# Patient Record
Sex: Female | Born: 1995
Health system: Southern US, Community
[De-identification: ages and names within clinical notes are randomized; demographics above are authoritative.]

## PROBLEM LIST (undated history)

## (undated) DIAGNOSIS — K219 Gastro-esophageal reflux disease without esophagitis: Secondary | ICD-10-CM

## (undated) DIAGNOSIS — K831 Obstruction of bile duct: Secondary | ICD-10-CM

## (undated) DIAGNOSIS — J45909 Unspecified asthma, uncomplicated: Secondary | ICD-10-CM

## (undated) DIAGNOSIS — K900 Celiac disease: Secondary | ICD-10-CM

## (undated) DIAGNOSIS — U071 COVID-19: Secondary | ICD-10-CM

## (undated) DIAGNOSIS — Z87820 Personal history of traumatic brain injury: Secondary | ICD-10-CM

## (undated) HISTORY — DX: Celiac disease: K90.0

## (undated) HISTORY — PX: OTHER SURGICAL HISTORY: SHX169

## (undated) HISTORY — DX: COVID-19: U07.1

## (undated) HISTORY — DX: Personal history of traumatic brain injury: Z87.820

---
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
## 2014-01-26 ENCOUNTER — Other Ambulatory Visit (HOSPITAL_COMMUNITY): Payer: Self-pay | Admitting: Physician Assistant

## 2014-01-26 DIAGNOSIS — R1030 Lower abdominal pain, unspecified: Secondary | ICD-10-CM

## 2014-01-27 ENCOUNTER — Encounter: Payer: Self-pay | Admitting: Internal Medicine

## 2014-01-28 ENCOUNTER — Ambulatory Visit (HOSPITAL_COMMUNITY)
Admission: RE | Admit: 2014-01-28 | Discharge: 2014-01-28 | Disposition: A | Discharge status: Home / Self Care | Payer: BC Managed Care – PPO | Source: Ambulatory Visit | Attending: Physician Assistant | Admitting: Physician Assistant

## 2014-01-28 ENCOUNTER — Encounter: Payer: Self-pay | Admitting: Internal Medicine

## 2014-01-28 DIAGNOSIS — R1030 Lower abdominal pain, unspecified: Secondary | ICD-10-CM | POA: Diagnosis present

## 2014-03-11 ENCOUNTER — Ambulatory Visit: Payer: BC Managed Care – PPO | Admitting: Gastroenterology

## 2014-03-18 ENCOUNTER — Ambulatory Visit: Payer: Self-pay | Admitting: Nurse Practitioner

## 2014-04-07 ENCOUNTER — Ambulatory Visit: Payer: Self-pay | Admitting: Nurse Practitioner

## 2014-04-27 ENCOUNTER — Ambulatory Visit: Payer: Self-pay | Admitting: Nurse Practitioner

## 2014-04-28 ENCOUNTER — Encounter: Payer: Self-pay | Admitting: Nurse Practitioner

## 2014-05-07 ENCOUNTER — Ambulatory Visit: Payer: Medicaid Other | Admitting: Nurse Practitioner

## 2014-05-14 ENCOUNTER — Ambulatory Visit (INDEPENDENT_AMBULATORY_CARE_PROVIDER_SITE_OTHER): Payer: BLUE CROSS/BLUE SHIELD | Admitting: Nurse Practitioner

## 2014-05-14 ENCOUNTER — Other Ambulatory Visit: Payer: Self-pay

## 2014-05-14 ENCOUNTER — Encounter: Payer: Self-pay | Admitting: Nurse Practitioner

## 2014-05-14 VITALS — BP 132/80 | HR 118 | Temp 97.8°F | Ht 66.0 in | Wt 139.0 lb

## 2014-05-14 DIAGNOSIS — R109 Unspecified abdominal pain: Secondary | ICD-10-CM | POA: Insufficient documentation

## 2014-05-14 DIAGNOSIS — K219 Gastro-esophageal reflux disease without esophagitis: Secondary | ICD-10-CM

## 2014-05-14 DIAGNOSIS — R1010 Upper abdominal pain, unspecified: Secondary | ICD-10-CM

## 2014-05-14 DIAGNOSIS — R1013 Epigastric pain: Secondary | ICD-10-CM | POA: Insufficient documentation

## 2014-05-14 MED ORDER — SUCRALFATE 1 G PO TABS
1.0000 g | ORAL_TABLET | Freq: Three times a day (TID) | ORAL | Status: DC | PRN
Start: 2014-05-14 — End: 2017-03-30

## 2014-05-14 NOTE — Progress Notes (Signed)
cc'ed to pcp °
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
## 2014-05-14 NOTE — Patient Instructions (Signed)
1. We will schedule you for your procedure (endoscopy) 2. Im sending in a prescription for Carafate to take up to three times a day as needed for worsening symptoms in the meantime. 3. We will check some labs as well. 4. Follow-up in 6-8 weeks, after your procedure. 5. Further recommendations to be based on the results of your procedure.

## 2014-05-14 NOTE — Assessment & Plan Note (Signed)
Abdominal pain for the past 2 years primarily epigastric with dyspepsia symptoms including nightly vomiting, nausea, excessive belching. Symptoms improved with addition of PPI Priloseec 20 mg daily, attempted change to Nexium by PCP with no effect, changed back to Prilosec 40 mg daily. Symptoms are persistent, although improved. Family history of ulcers and gastritis. PCP questioned possible gluten intolerance with TTG negative but noted IgA  Insufficiency. Will check IgG and TTG IgG, CBC. Likely GERD with possible gastritis/esophagitis. Will add Carafate 1 g up to tid prn for breakthrough symptoms. Return for re-evaluation in 6-8 weeks.  Proceed with EGD with Dr. Gala Romney in near future: the risks, benefits, and alternatives have been discussed with the patient in detail. The patient states understanding and desires to proceed.

## 2014-05-20 ENCOUNTER — Encounter (HOSPITAL_COMMUNITY): Payer: Self-pay | Admitting: *Deleted

## 2014-05-20 ENCOUNTER — Encounter (HOSPITAL_COMMUNITY)
Admission: RE | Disposition: A | Discharge status: Home / Self Care | Payer: Self-pay | Source: Ambulatory Visit | Attending: Internal Medicine

## 2014-05-20 ENCOUNTER — Ambulatory Visit (HOSPITAL_COMMUNITY)
Admission: RE | Admit: 2014-05-20 | Discharge: 2014-05-20 | Disposition: A | Discharge status: Home / Self Care | Payer: BLUE CROSS/BLUE SHIELD | Source: Ambulatory Visit | Attending: Internal Medicine | Admitting: Internal Medicine

## 2014-05-20 DIAGNOSIS — R140 Abdominal distension (gaseous): Secondary | ICD-10-CM | POA: Insufficient documentation

## 2014-05-20 DIAGNOSIS — R110 Nausea: Secondary | ICD-10-CM | POA: Insufficient documentation

## 2014-05-20 DIAGNOSIS — D802 Selective deficiency of immunoglobulin A [IgA]: Secondary | ICD-10-CM | POA: Diagnosis not present

## 2014-05-20 DIAGNOSIS — Z800 Family history of malignant neoplasm of digestive organs: Secondary | ICD-10-CM | POA: Diagnosis not present

## 2014-05-20 DIAGNOSIS — Z91018 Allergy to other foods: Secondary | ICD-10-CM | POA: Insufficient documentation

## 2014-05-20 DIAGNOSIS — K449 Diaphragmatic hernia without obstruction or gangrene: Secondary | ICD-10-CM | POA: Diagnosis not present

## 2014-05-20 DIAGNOSIS — Z888 Allergy status to other drugs, medicaments and biological substances status: Secondary | ICD-10-CM | POA: Insufficient documentation

## 2014-05-20 DIAGNOSIS — R109 Unspecified abdominal pain: Secondary | ICD-10-CM | POA: Diagnosis not present

## 2014-05-20 DIAGNOSIS — K219 Gastro-esophageal reflux disease without esophagitis: Secondary | ICD-10-CM

## 2014-05-20 DIAGNOSIS — Z79899 Other long term (current) drug therapy: Secondary | ICD-10-CM | POA: Diagnosis not present

## 2014-05-20 HISTORY — DX: Gastro-esophageal reflux disease without esophagitis: K21.9

## 2014-05-20 HISTORY — PX: ESOPHAGOGASTRODUODENOSCOPY: SHX5428

## 2014-05-20 SURGERY — EGD (ESOPHAGOGASTRODUODENOSCOPY)
Anesthesia: Moderate Sedation

## 2014-05-20 MED ORDER — MEPERIDINE HCL 100 MG/ML IJ SOLN
INTRAMUSCULAR | Status: DC | PRN
  Administered 2014-05-20: 50 mg via INTRAVENOUS
  Administered 2014-05-20 (×2): 25 mg via INTRAVENOUS

## 2014-05-20 MED ORDER — ONDANSETRON HCL 4 MG/2ML IJ SOLN
INTRAMUSCULAR | Status: DC | PRN
  Administered 2014-05-20: 4 mg via INTRAVENOUS

## 2014-05-20 MED ORDER — SIMETHICONE 40 MG/0.6ML PO SUSP
ORAL | Status: DC | PRN
  Administered 2014-05-20: 10:00:00

## 2014-05-20 MED ORDER — SODIUM CHLORIDE 0.9 % IV SOLN
INTRAVENOUS | Status: DC
Start: 2014-05-20 — End: 2014-05-20
  Administered 2014-05-20: 09:00:00 via INTRAVENOUS

## 2014-05-20 MED ORDER — LIDOCAINE VISCOUS 2 % MT SOLN
OROMUCOSAL | Status: DC | PRN
  Administered 2014-05-20: 3 mL via OROMUCOSAL

## 2014-05-20 MED ORDER — ONDANSETRON HCL 4 MG/2ML IJ SOLN
INTRAMUSCULAR | Status: AC
  Filled 2014-05-20: qty 2

## 2014-05-20 MED ORDER — LIDOCAINE VISCOUS 2 % MT SOLN
OROMUCOSAL | Status: AC
  Filled 2014-05-20: qty 15

## 2014-05-20 MED ORDER — MEPERIDINE HCL 100 MG/ML IJ SOLN
INTRAMUSCULAR | Status: AC
  Filled 2014-05-20: qty 2

## 2014-05-20 MED ORDER — MIDAZOLAM HCL 5 MG/5ML IJ SOLN
INTRAMUSCULAR | Status: DC | PRN
  Administered 2014-05-20: 1 mg via INTRAVENOUS
  Administered 2014-05-20 (×2): 2 mg via INTRAVENOUS

## 2014-05-20 MED ORDER — MIDAZOLAM HCL 5 MG/5ML IJ SOLN
INTRAMUSCULAR | Status: AC
  Filled 2014-05-20: qty 10

## 2014-05-20 NOTE — Discharge Instructions (Addendum)
EGD Discharge instructions Please read the instructions outlined below and refer to this sheet in the next few weeks. These discharge instructions provide you with general information on caring for yourself after you leave the hospital. Your doctor may also give you specific instructions. While your treatment has been planned according to the most current medical practices available, unavoidable complications occasionally occur. If you have any problems or questions after discharge, please call your doctor. ACTIVITY  You may resume your regular activity but move at a slower pace for the next 24 hours.   Take frequent rest periods for the next 24 hours.   Walking will help expel (get rid of) the air and reduce the bloated feeling in your abdomen.   No driving for 24 hours (because of the anesthesia (medicine) used during the test).   You may shower.   Do not sign any important legal documents or operate any machinery for 24 hours (because of the anesthesia used during the test).  NUTRITION  Drink plenty of fluids.   You may resume your normal diet.   Begin with a light meal and progress to your normal diet.   Avoid alcoholic beverages for 24 hours or as instructed by your caregiver.  MEDICATIONS  You may resume your normal medications unless your caregiver tells you otherwise.  WHAT YOU CAN EXPECT TODAY  You may experience abdominal discomfort such as a feeling of fullness or gas pains.  FOLLOW-UP  Your doctor will discuss the results of your test with you.  SEEK IMMEDIATE MEDICAL ATTENTION IF ANY OF THE FOLLOWING OCCUR:  Excessive nausea (feeling sick to your stomach) and/or vomiting.   Severe abdominal pain and distention (swelling).   Trouble swallowing.   Temperature over 101 F (37.8 C).  Rectal bleeding or vomiting of blood.    GERD information provided  Continue Prilosec 40 mg daily  Begin Carafate as previously recommended  Further recommendations to  follow pending review of pathology report  Office visit with [place] in about 6-8 weeks     Gastroesophageal Reflux Disease, Adult Gastroesophageal reflux disease (GERD) happens when acid from your stomach flows up into the esophagus. When acid comes in contact with the esophagus, the acid causes soreness (inflammation) in the esophagus. Over time, GERD may create small holes (ulcers) in the lining of the esophagus. CAUSES   Increased body weight. This puts pressure on the stomach, making acid rise from the stomach into the esophagus.  Smoking. This increases acid production in the stomach.  Drinking alcohol. This causes decreased pressure in the lower esophageal sphincter (valve or ring of muscle between the esophagus and stomach), allowing acid from the stomach into the esophagus.  Late evening meals and a full stomach. This increases pressure and acid production in the stomach.  A malformed lower esophageal sphincter. Sometimes, no cause is found. SYMPTOMS   Burning pain in the lower part of the mid-chest behind the breastbone and in the mid-stomach area. This may occur twice a week or more often.  Trouble swallowing.  Sore throat.  Dry cough.  Asthma-like symptoms including chest tightness, shortness of breath, or wheezing. DIAGNOSIS  Your caregiver may be able to diagnose GERD based on your symptoms. In some cases, X-rays and other tests may be done to check for complications or to check the condition of your stomach and esophagus. TREATMENT  Your caregiver may recommend over-the-counter or prescription medicines to help decrease acid production. Ask your caregiver before starting or adding any new medicines.  HOME CARE INSTRUCTIONS   Change the factors that you can control. Ask your caregiver for guidance concerning weight loss, quitting smoking, and alcohol consumption.  Avoid foods and drinks that make your symptoms worse, such as:  Caffeine or alcoholic  drinks.  Chocolate.  Peppermint or mint flavorings.  Garlic and onions.  Spicy foods.  Citrus fruits, such as oranges, lemons, or limes.  Tomato-based foods such as sauce, chili, salsa, and pizza.  Fried and fatty foods.  Avoid lying down for the 3 hours prior to your bedtime or prior to taking a nap.  Eat small, frequent meals instead of large meals.  Wear loose-fitting clothing. Do not wear anything tight around your waist that causes pressure on your stomach.  Raise the head of your bed 6 to 8 inches with wood blocks to help you sleep. Extra pillows will not help.  Only take over-the-counter or prescription medicines for pain, discomfort, or fever as directed by your caregiver.  Do not take aspirin, ibuprofen, or other nonsteroidal anti-inflammatory drugs (NSAIDs). SEEK IMMEDIATE MEDICAL CARE IF:   You have pain in your arms, neck, jaw, teeth, or back.  Your pain increases or changes in intensity or duration.  You develop nausea, vomiting, or sweating (diaphoresis).  You develop shortness of breath, or you faint.  Your vomit is green, yellow, black, or looks like coffee grounds or blood.  Your stool is red, bloody, or black. These symptoms could be signs of other problems, such as heart disease, gastric bleeding, or esophageal bleeding. MAKE SURE YOU:   Understand these instructions.  Will watch your condition.  Will get help right away if you are not doing well or get worse. Document Released: 12/07/2004 Document Revised: 05/22/2011 Document Reviewed: 09/16/2010 [facility] Patient Information 2015 [place], [place]. This information is not intended to replace advice given to you by your health care provider. Make sure you discuss any questions you have with your health care provider.
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
## 2014-05-20 NOTE — Interval H&P Note (Signed)
History and Physical Interval Note:  05/20/2014 9:51 AM  Maria Cooper  has presented today for surgery, with the diagnosis of GERD  The various methods of treatment have been discussed with the patient and family. After consideration of risks, benefits and other options for treatment, the patient has consented to  Procedure(s) with comments: ESOPHAGOGASTRODUODENOSCOPY (EGD) (N/A) - 1015 - Candy to notify pt  as a surgical intervention .  The patient's history has been reviewed, patient examined, no change in status, stable for surgery.  I have reviewed the patient's chart and labs.  Questions were answered to the patient's satisfaction.     Manus Rudd  Notable improvement, but not complete resolution of symptoms, will taking Prilosec 40 mg daily. Just got Carafate prescription filled yesterday but has not yet started taking. The no change otherwise.  The risks, benefits, limitations, alternatives and imponderables have been reviewed with the patient. Potential for esophageal dilation, biopsy, etc. have also been reviewed.  Questions have been answered. All parties agreeable.

## 2014-05-20 NOTE — Op Note (Signed)
[facility] [address] [place], [address]   ENDOSCOPY PROCEDURE REPORT  PATIENT: Maria, Cooper  MR#: [id number] BIRTHDATE: [birth year] , 19  yrs. old GENDER: female ENDOSCOPIST: R.  Garfield Cornea, MD Quentin Ore REFERRED BY:     [facility] PROCEDURE DATE:  2014/06/12 PROCEDURE:  EGD w/ biopsy INDICATIONS:  Refractory GERD; reported normal TTG antibody with IgA deficiency. MEDICATIONS: Versed 5 mg IV and Demerol 100 mg IV in divided doses. Zofran 4 mg IV. Xylocaine gel orally.. ASA CLASS:      Class II  CONSENT: The risks, benefits, limitations, alternatives and imponderables have been discussed.  The potential for biopsy, esophogeal dilation, etc. have also been reviewed.  Questions have been answered.  All parties agreeable.  Please see the history and physical in the medical record for more information.  DESCRIPTION OF PROCEDURE: After the risks benefits and alternatives of the procedure were thoroughly explained, informed consent was obtained.  The EG-[id number] ([id number]) endoscope was introduced through the mouth and advanced to the second portion of the duodenum , limited by Without limitations. The instrument was slowly withdrawn as the mucosa was fully examined. Normal esophagus. 1 cm hiatal hernia. Normal-appearing gastric mucosa. Normal-appearing first, second and third portion of the duodenum.  The duodenum was biopsied    The scope was then withdrawn from the patient and the procedure completed.  COMPLICATIONS: There were no immediate complications.  ENDOSCOPIC IMPRESSION: Normal esophagus. Tiny hiatal hernia; otherwise normal-appearing stomach and duodenum?"status post duodenal biopsy  RECOMMENDATIONS: Continue Prilosec 40 mg daily. Add Carafate suspension to regimen as previously recommended. Follow-up on pathology. Office visit with [place] in 6 weeks.  REPEAT EXAM:  eSigned:  R. Garfield Cornea, MD Rosalita Chessman [facility] June 12, 2014  10:29 AM    CC:  CPT CODES: ICD CODES:  The ICD and CPT codes recommended by this software are interpretations from the data that the clinical staff has captured with the software.  The verification of the translation of this report to the ICD and CPT codes and modifiers is the sole responsibility of the health care institution and practicing physician where this report was generated.  [employer]. will not be held responsible for the validity of the ICD and CPT codes included on this report.  AMA assumes no liability for data contained or not contained herein. CPT is a Designer, television/film set of the [employer].

## 2014-05-21 ENCOUNTER — Encounter (HOSPITAL_COMMUNITY): Payer: Self-pay | Admitting: Internal Medicine

## 2014-05-21 ENCOUNTER — Other Ambulatory Visit (HOSPITAL_COMMUNITY)
Admission: RE | Admit: 2014-05-21 | Discharge: 2014-05-21 | Disposition: A | Discharge status: Home / Self Care | Payer: BLUE CROSS/BLUE SHIELD | Source: Ambulatory Visit | Attending: Nurse Practitioner | Admitting: Nurse Practitioner

## 2014-05-21 ENCOUNTER — Encounter: Payer: Self-pay | Admitting: Internal Medicine

## 2014-06-02 ENCOUNTER — Telehealth: Payer: Self-pay | Admitting: Internal Medicine

## 2014-06-02 NOTE — Telephone Encounter (Signed)
I called the patient to confirm which OV she wanted since she had been scheduled twice. She is aware of OV for 4/27 and then asked if she could have her labs done at [facility]?  She is going either tomorrow or the next day.Please [phone]

## 2014-06-03 ENCOUNTER — Other Ambulatory Visit: Payer: Self-pay

## 2014-06-03 DIAGNOSIS — K900 Celiac disease: Secondary | ICD-10-CM

## 2014-06-03 NOTE — Telephone Encounter (Signed)
I have talked with the pt. I informed her that she can have the blood work done at her pcp and asked her to make sure to tell them to fax it to [place]. I also went over her bx letter with her and informed her that she had celiac disease. I am mailing the letter and some celiac information to her. Advised her that it was very important for her to have the blood work done. She stated she understood and didn't have any questions at this time.  Per RMR letter- pt needs referral to the dietician at [facility]. She is aware of this. Please refer pt.   Manuela Schwartz, please fax procedure note and bx and RMR letter to pcp.

## 2014-06-03 NOTE — Telephone Encounter (Signed)
Faxed procedure, path and letter to [facility]

## 2014-06-04 NOTE — Telephone Encounter (Signed)
Referral has been made.

## 2014-07-08 ENCOUNTER — Encounter: Payer: Self-pay | Admitting: Nurse Practitioner

## 2014-07-08 ENCOUNTER — Telehealth: Payer: Self-pay | Admitting: Internal Medicine

## 2014-07-08 ENCOUNTER — Ambulatory Visit: Payer: BLUE CROSS/BLUE SHIELD | Admitting: Nurse Practitioner

## 2014-07-08 NOTE — Telephone Encounter (Signed)
PATIENT WAS A NO SHOW 07/08/14 AND LETTER WAS SENT

## 2014-07-09 ENCOUNTER — Encounter: Payer: Self-pay | Admitting: Nurse Practitioner

## 2014-07-09 ENCOUNTER — Ambulatory Visit (INDEPENDENT_AMBULATORY_CARE_PROVIDER_SITE_OTHER): Payer: BLUE CROSS/BLUE SHIELD | Admitting: Nurse Practitioner

## 2014-07-09 VITALS — BP 126/73 | HR 93 | Temp 97.9°F | Ht 66.0 in | Wt 139.4 lb

## 2014-07-09 DIAGNOSIS — K900 Celiac disease: Secondary | ICD-10-CM | POA: Diagnosis not present

## 2014-07-09 HISTORY — DX: Celiac disease: K90.0
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
## 2014-07-09 NOTE — Progress Notes (Signed)
cc'ed to pcp °

## 2014-07-09 NOTE — Assessment & Plan Note (Signed)
Patient with recurrent celiac sprue on duodenal biopsy during endoscopy. Patient has been sent information about celiac disease and appropriate dietary restrictions. One was made with nutritionist Forestine Na, however she received a note from her insurance company that it would not be covered and it seems like it's in network issue. Have requested office staff to contact insurance company and see if we can find a nutritionist that is in network with her she can benefit from this excellent counseling. She has been trying to avoid gluten and has begun to notice the symptomatically changes when she had inadvertently consumed gluten. Overall she is doing quite well. We'll have her return for follow-up in 6 months to check on her progress and make any changes as necessary.

## 2014-07-09 NOTE — Patient Instructions (Addendum)
1. We will try to contact her insurance company and find a nutritionist that is in network would be covered. 2. We will call you with the results of this. 3. Return for follow-up in 6 months and we can check her progress and see her doing     Celiac Disease Celiac disease is a digestive disease that causes your body's natural defense system (immune system) to react against its own cells. It interferes with taking in (absorbing) nutrients from food. Celiac disease is also known as celiac sprue, nontropical sprue, and gluten-sensitive enteropathy. People who have celiac disease cannot tolerate gluten. Gluten is a protein found in wheat, rye, and barley. With time, celiac disease will damage the cells lining the small intestine. This leads to being unable to absorb nutrients from food (malabsorption), diarrhea, and nutritional problems. CAUSES  Celiac disease is genetic. This means you have a higher likelihood of getting the disease if someone in your family has or has had it. Up to 10% of your close relatives (parent, sibling, child) may also have the disease.  People with celiac disease tend to have other autoimmune diseases. The link may be genetic. These diseases include:  Dermatitis herpetiformis.  Thyroid disease.  Systemic lupus erythematosus.  Type 1 diabetes.  Liver disease.  Collagen vascular disease.  Rheumatoid arthritis.  Sjogren syndrome. SYMPTOMS  The symptoms of celiac disease vary from person to person. The symptoms are generally digestive or nutritional. Digestive symptoms include:  Recurring belly (abdominal) bloating and pain.  Gas.  Long-term (chronic) diarrhea.  Pale, bad-smelling, greasy, or oily stool. Nutritional symptoms include:  Failure to thrive in infants.  Delayed growth in children.  Weight loss in children and adults.  Missed menstrual periods (often due to extreme weight loss).  Anemia.  Weakening bones (osteoporosis).  Fatigue and  weakness.  Tingling or other signs of nerve damage (peripheral neuropathy).  Depression. DIAGNOSIS  If your symptoms and physical exam suggest that a digestive disorder or malnutrition is present, your caregiver may suspect celiac disease. You may have already begun a gluten-free diet. If symptoms persist, testing may be needed to confirm the diagnosis. Some tests are best done while you are on a normal, unrestricted diet. Tests may include:  Blood tests to check for nutritional deficiencies.  Blood tests to look for evidence that the body is producing antibodies against its own small intestine cells.  Taking a tissue sample (biopsy) from the small bowel for evaluation.  X-rays of the small bowel.  Evaluating the stool for fat.  Tests to check for nutrient absorption from the intestine. TREATMENT  It is important to seek treatment. Untreated celiac disease can cause growth problems (in children), anemia, osteoporosis, and possible nerve problems. A pregnant patient with untreated celiac disease has a higher risk of miscarriage, and the fetus has an increased risk of low birth weight and other growth problems. If celiac disease is diagnosed in the early stages, treatment can allow you to live a long, nearly symptom-free life. Treatment includes following a gluten-free diet. This means avoiding all foods that contain gluten. Eating even a small amount of gluten can damage your intestine. For most people, following this diet will stop symptoms. It will heal existing intestinal damage and prevent further damage. Improvements begin within days of starting the diet. The small intestine is usually completely healed within 3 to 6 months, or it may take up to 2 years for older adults. A small percentage of people do not improve on the  gluten-free diet. Depending on your age and the stage at which you were diagnosed, some problems such as delayed growth and discolored teeth may not improve. Sometimes,  damaged intestines cannot heal. If your intestines are not absorbing enough nutrients, you may need to receive nutrition supplements through an intravenous (IV) tube. Drug treatments are being tested for unresponsive celiac disease. In this case, you may need to be evaluated for complications of the disease. Your caregiver may also recommend:  A pneumonia vaccination.  Nutrients and other treatments for any nutritional deficiencies. Your caregiver can provide you with more information on a gluten-free diet. Discussion with a dietitian skilled in this illness will be valuable. Support groups may also be helpful. HOME CARE INSTRUCTIONS   Focus on a gluten-free diet. This diet must become a way of life.  Monitor your response to the gluten-free diet and treat any nutritional deficiencies.  Prepare ahead of time if you decide to eat outside the home.  Make and keep your regular follow-up visits with your caregiver.  Suggest to family members that they get screened for early signs of the disease. SEEK MEDICAL CARE IF:   You continue to have digestive symptoms (gas, cramping, diarrhea) despite a proper diet.  You have trouble sticking to the gluten-free diet.  You develop an itchy rash with groups of tiny blisters.  You develop severe weakness, balance problems, menstrual problems, or depression. Document Released: 02/27/2005 Document Revised: 07/14/2013 Document Reviewed: 06/16/2009 [facility] Patient Information 2015 [place], [place]. This information is not intended to replace advice given to you by your health care provider. Make sure you discuss any questions you have with your health care provider.    Gluten-Free Diet for Celiac Disease Gluten is a protein found in wheat, rye, barley, and triticale (a cross between wheat and rye) grains. People with celiac disease need to have a gluten-free diet. With celiac disease, gluten interferes with the absorption of food and may also cause  intestinal injury.  Strict compliance is important even during symptom-free periods. This means eliminating all foods with gluten from your diet permanently. This requires some significant changes but is very manageable. WHAT DO I NEED TO KNOW ABOUT A GLUTEN-FREE DIET?  Look for items labeled with "GF." Looking for GF will make it easier to identify products that are safe to eat.  Read all labels. Gluten may have been added as a minor ingredient where least expected, such as in shredded cheeses or ice creams. Always check food labels and investigate questionable ingredients. Talk to your dietitian or health care provider if you have questions about certain foods or need help finding GF foods.  Check when in doubt. If you are not sure whether an ingredient contains gluten, check with the manufacturer. Note that some manufacturers may change ingredients without notice. Always read labels.   Know how food is prepared. Since flour and cereal products are often used in the preparation of foods, it is important to be aware of the methods of preparation used, as well as the ingredients in the foods themselves. This is especially true when you are dining out. Ask restaurants if they have a gluten-free menu.  Watch for cross-contamination. Cross-contamination occurs when gluten-free foods come into contact with foods that contain gluten. It often happens during the manufacturing process. Always check the ingredient list and for warnings on packages, such as "may contain gluten."  Eat a balanced diet. It is important to still get enough fiber, iron, and B vitamins in your  diet. Look for enriched whole grain gluten-free products and continue to eat a well-balanced diet of the important non-grain items, such as vegetables, fruit, lean proteins, legumes, and dairy.  Consider taking a gluten-free multivitamin and mineral supplement. Discuss this with your health care provider. WHAT KEY WORDS HELP IDENTIFY  GLUTEN? Know key words to help identify gluten. A dietitian can help you identify possible harmful ingredients in the foods you normally eat. Words to check for on food labels include:   Flour, enriched flour, bromated flour, white flour, durum flour, graham flour, phosphated flour, self-rising flour, semolina, or farina.  Starch, dextrin, modified food starch, or cereal.  Thickening, fillers, or emulsifiers.  Any kind of malt flavoring, extract, or syrup (malt is made from barley and includes malt vinegar, malted milk, and malted beverages).  Hydrolyzed vegetable protein. WHAT FOODS CAN I EAT? Below is a list of common foods that are allowed with a gluten-free diet.  Grains Products made from the following flours or grains:amaranth,bean flours, 100% buckwheat flour, corn, millet, nut flours or meals, GF oats, quinoa, rice, sorghum, teff, any all-purpose 100% GF flour mix, rice wafers, pure cornmeal tortillas, popcorn, some crackers, some chips, and hot cereals made from cornmeal. Ask your dietitian which specific hot and cold cereals are allowed. Hominy, rice or wild rice, and special GF pasta. Some Asian rice noodles or bean noodles. Arrowroot starch, corn bran, corn flour, corn germ, cornmeal, corn starch, potato flour, potato starch flour, and rice bran. Rice flours: plain, brown, and sweet. Rice polish, soy flour, tapioca starch. Vegetables All plain, fresh, frozen, or canned vegetables.  Fruits All fresh, frozen, canned, dried fruits, and fruit juices.  Meats and Other Protein Foods Meat, fish, poultry, or eggs prepared without added wheat, rye, barley, or triticale. Some luncheon meat and some frankfurters. Pure meat. All aged cheese, most processed cheese products, some cottage cheese, and some cream cheese. Dried beans, dried peas, and lentils.  Dairy Milk and yogurt made with allowed ingredients.  Beverages Coffee (regular or decaffeinated), tea, herbal tea (read label to  be sure that no wheat flour has been added). Carbonated beverages and some root beers. Wine, sake, and distilled spirits, such as gin, vodka, and whiskey. GF beers and GF ciders.  Sweetsand Desserts Sugar, honey, some syrups, molasses, jelly, jam, plain hard candy, marshmallows, gumdrops, homemade candies free of wheat, rye, barley, or triticale. Coconut. Custard, some pudding mixes, and homemade puddings from cornstarch, rice, and tapioca. Gelatin desserts, sorbets, frozen ice pops, and sherbet. Cake, cookies, and other desserts prepared with allowed flours. Some commercial ice creams. Ask your dietitian about specific brands of dessert that are allowed.  Fats and Oils Butter, margarine, vegetable oil, sour cream not containing modified food starch, whipping cream, shortening, lard, cream, and some mayonnaise. Some commercial salad dressings. Peanut butter.  Other Homemade broth and soups made with allowed ingredients; some canned or frozen soups. Any other combination or prepared foods that do not contain gluten. Monosodium glutamate (MSG). Cider, rice, and wine vinegar. Baking soda and baking powder. Certain soy sauces (Tamari). Ask your dietitian about specific brands that are allowed. Nuts, coconut, chocolate, and pure cocoa powder. Salt, pepper, herbs, spices, extracts, and food colorings. The items listed above may not be a complete list of allowed foods or beverages. Contact your dietitian for more options.  WHAT FOODS CAN I NOT EAT? Below is a list of common foods that are not allowed with a gluten-free diet.  Grains Barley, bran, bulgur, cracked  wheat, graham, malt, matzo, wheat germ, and all wheat and rye cereals including spelt and kamut. Avoid cereals containing malt as a flavoring, such as rice cereal. Also avoid regular noodles, spaghetti, macaroni, and most packaged rice mixes, and all others containing wheat, rye, barley, or triticale.  Vegetables Most creamed vegetables, most  vegetables canned in sauces, and any vegetables prepared with wheat, rye, barley, or triticale.  Fruits Thickened or prepared fruits and some pie fillings.  Meats and Other Protein Sources Any meat or meat alternative containing wheat, rye, barley, or gluten stabilizers (such as some hot dogs, salami, cold cuts, or sausage). Bread-containing products, such as Swiss steak, croquettes, and meatloaf. Most tuna canned in vegetable broth, [place] with hydrolyzed vegetable protein (HVP) injected as part of the basting, and any cheese product containing oat gum as an ingredient. Seitan. Imitation fish. Dairy Commercial chocolate milk, which may have cereal added, and malted milk. Beverages Certain cereal beverages. Beer and ciders (unless GF), ale, malted milk, and some root beers. Sweetsand Desserts Commercial candies containing wheat, rye, barley, or triticale. Certain toffees are dusted with wheat flour. Chocolate-coated nuts, which are often rolled in flour. Cakes, cookies, doughnuts, and pastries that are prepared with wheat, barley, rye, or triticale flour. Some commercial ice creams, ice cream flavors which contain cookies, crumbs, or cheesecake. Ice cream cones. Commercially prepared mixes for cakes, cookies, and other desserts unless marked GF. Bread pudding and other puddings thickened with flour. Fats and Oils Some commercial salad dressings and sour cream containing modified food starch.  Condiments Some curry powder, some dry seasoning mixes, some gravy extracts, some meat sauces, some ketchup, some prepared mustard, horseradish. Other All soups containing wheat, rye, barley, or triticale flour. Bouillon and bouillon cubes that contain HVP. Combination or prepared foods that contain gluten. Some soy sauce, some chip dips, and some chewing gum. Yeast extract (contains barley). Caramel color (may contain malt). The items listed above may not be a complete list of foods and beverages to  avoid. Contact your dietitian for more information. Document Released: 02/27/2005 Document Revised: 07/14/2013 Document Reviewed: 01/01/2013 [facility] Patient Information 2015 [place], [place]. This information is not intended to replace advice given to you by your health care provider. Make sure you discuss any questions you have with your health care provider.

## 2014-07-14 ENCOUNTER — Ambulatory Visit: Payer: BLUE CROSS/BLUE SHIELD | Admitting: Nurse Practitioner

## 2014-07-24 NOTE — Telephone Encounter (Signed)
Noted  

## 2014-08-05 ENCOUNTER — Ambulatory Visit: Payer: BLUE CROSS/BLUE SHIELD | Admitting: Nutrition

## 2014-09-16 ENCOUNTER — Telehealth: Payer: Self-pay | Admitting: Nutrition

## 2014-09-16 NOTE — Telephone Encounter (Signed)
Left VM to call to reschedule missed/canceled appointment. PC

## 2015-01-07 ENCOUNTER — Ambulatory Visit: Payer: BLUE CROSS/BLUE SHIELD | Admitting: Nurse Practitioner

## 2015-12-13 ENCOUNTER — Emergency Department (HOSPITAL_COMMUNITY): Payer: BLUE CROSS/BLUE SHIELD

## 2015-12-13 ENCOUNTER — Encounter (HOSPITAL_COMMUNITY): Payer: Self-pay

## 2015-12-13 ENCOUNTER — Emergency Department (HOSPITAL_COMMUNITY)
Admission: EM | Admit: 2015-12-13 | Discharge: 2015-12-13 | Disposition: A | Discharge status: Home / Self Care | Payer: BLUE CROSS/BLUE SHIELD | Attending: Emergency Medicine | Admitting: Emergency Medicine

## 2015-12-13 DIAGNOSIS — R1031 Right lower quadrant pain: Secondary | ICD-10-CM | POA: Insufficient documentation

## 2015-12-13 DIAGNOSIS — R110 Nausea: Secondary | ICD-10-CM | POA: Diagnosis not present

## 2015-12-13 DIAGNOSIS — R102 Pelvic and perineal pain: Secondary | ICD-10-CM

## 2015-12-13 DIAGNOSIS — Z79899 Other long term (current) drug therapy: Secondary | ICD-10-CM | POA: Insufficient documentation

## 2015-12-13 LAB — COMPREHENSIVE METABOLIC PANEL
ALK PHOS: 55 U/L (ref 38–126)
ALT: 13 U/L — ABNORMAL LOW (ref 14–54)
AST: 17 U/L (ref 15–41)
Albumin: 4.3 g/dL (ref 3.5–5.0)
Anion gap: 7 (ref 5–15)
BUN: 11 mg/dL (ref 6–20)
CALCIUM: 9.1 mg/dL (ref 8.9–10.3)
CO2: 25 mmol/L (ref 22–32)
Chloride: 105 mmol/L (ref 101–111)
Creatinine, Ser: 0.67 mg/dL (ref 0.44–1.00)
Glucose, Bld: 114 mg/dL — ABNORMAL HIGH (ref 65–99)
Potassium: 3.4 mmol/L — ABNORMAL LOW (ref 3.5–5.1)
SODIUM: 137 mmol/L (ref 135–145)
Total Bilirubin: 0.7 mg/dL (ref 0.3–1.2)
Total Protein: 7.2 g/dL (ref 6.5–8.1)

## 2015-12-13 LAB — CBC WITH DIFFERENTIAL/PLATELET
BASOS ABS: 0 10*3/uL (ref 0.0–0.1)
Basophils Relative: 1 %
Eosinophils Absolute: 0 10*3/uL (ref 0.0–0.7)
Eosinophils Relative: 0 %
HCT: 37.9 % (ref 36.0–46.0)
HEMOGLOBIN: 13.2 g/dL (ref 12.0–15.0)
LYMPHS ABS: 1.8 10*3/uL (ref 0.7–4.0)
Lymphocytes Relative: 21 %
MCH: 30.2 pg (ref 26.0–34.0)
MCHC: 34.8 g/dL (ref 30.0–36.0)
MCV: 86.7 fL (ref 78.0–100.0)
Monocytes Absolute: 0.6 10*3/uL (ref 0.1–1.0)
Monocytes Relative: 7 %
NEUTROS PCT: 71 %
Neutro Abs: 6.2 10*3/uL (ref 1.7–7.7)
Platelets: 166 10*3/uL (ref 150–400)
RBC: 4.37 MIL/uL (ref 3.87–5.11)
RDW: 11.7 % (ref 11.5–15.5)
WBC: 8.7 10*3/uL (ref 4.0–10.5)

## 2015-12-13 LAB — URINE MICROSCOPIC-ADD ON: WBC, UA: NONE SEEN WBC/hpf (ref 0–5)

## 2015-12-13 LAB — POC URINE PREG, ED: Preg Test, Ur: NEGATIVE

## 2015-12-13 LAB — LIPASE, BLOOD: Lipase: 20 U/L (ref 11–51)

## 2015-12-13 LAB — URINALYSIS, ROUTINE W REFLEX MICROSCOPIC
Bilirubin Urine: NEGATIVE
Glucose, UA: NEGATIVE mg/dL
Ketones, ur: NEGATIVE mg/dL
LEUKOCYTES UA: NEGATIVE
Nitrite: NEGATIVE
Protein, ur: NEGATIVE mg/dL
SPECIFIC GRAVITY, URINE: 1.025 (ref 1.005–1.030)
pH: 5.5 (ref 5.0–8.0)

## 2015-12-13 MED ORDER — SODIUM CHLORIDE 0.9 % IV BOLUS (SEPSIS)
1000.0000 mL | Freq: Once | INTRAVENOUS | Status: AC
  Administered 2015-12-13: 1000 mL via INTRAVENOUS

## 2015-12-13 MED ORDER — FENTANYL CITRATE (PF) 100 MCG/2ML IJ SOLN
50.0000 ug | INTRAMUSCULAR | Status: DC | PRN
  Administered 2015-12-13 (×2): 50 ug via INTRAVENOUS
  Filled 2015-12-13 (×2): qty 2

## 2015-12-13 MED ORDER — IBUPROFEN 400 MG PO TABS
600.0000 mg | ORAL_TABLET | Freq: Once | ORAL | Status: AC
Start: 2015-12-13 — End: 2015-12-13
  Administered 2015-12-13: 600 mg via ORAL
  Filled 2015-12-13: qty 2

## 2015-12-13 MED ORDER — IOPAMIDOL (ISOVUE-300) INJECTION 61%
100.0000 mL | Freq: Once | INTRAVENOUS | Status: AC | PRN
  Administered 2015-12-13: 100 mL via INTRAVENOUS

## 2015-12-13 MED ORDER — ONDANSETRON HCL 4 MG/2ML IJ SOLN
4.0000 mg | Freq: Once | INTRAMUSCULAR | Status: AC
  Administered 2015-12-13: 4 mg via INTRAVENOUS
  Filled 2015-12-13: qty 2

## 2015-12-13 MED ORDER — IOPAMIDOL (ISOVUE-300) INJECTION 61%
INTRAVENOUS | Status: AC
  Filled 2015-12-13: qty 30

## 2015-12-13 NOTE — ED Notes (Signed)
Set up for pelvic exam

## 2015-12-13 NOTE — ED Notes (Signed)
Pt returned from ct. nad
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
## 2015-12-13 NOTE — ED Triage Notes (Signed)
Pt c/o pain in rlq and diarrhea that started Saturday.  Reports no diarrhea since Saturday but still having pain.  Denies any urinary symptoms, denies any vaginal bleeding or discharge.

## 2015-12-13 NOTE — ED Notes (Signed)
In with edp

## 2015-12-13 NOTE — ED Notes (Signed)
Pt taken to ct 

## 2015-12-13 NOTE — Discharge Instructions (Signed)
Take ibuprofen and tylenol for pain.  If you were given medicines take as directed.  If you are on coumadin or contraceptives realize their levels and effectiveness is altered by many different medicines.  If you have any reaction (rash, tongues swelling, other) to the medicines stop taking and see a physician.    If your blood pressure was elevated in the ER make sure you follow up for management with a primary doctor or return for chest pain, shortness of breath or stroke symptoms.  Please follow up as directed and return to the ER or see a physician for new or worsening symptoms.  Thank you. Vitals:   12/13/15 0830 12/13/15 0900 12/13/15 1200 12/13/15 1203  BP: 115/69 103/71 99/58 99/58   Pulse: 77 85 64 66  Resp: 11 11  14   Temp:    98.5 F (36.9 C)  TempSrc:    Oral  SpO2: 100% 100% 99% 99%  Weight:      Height:

## 2015-12-13 NOTE — ED Notes (Signed)
Pt transported to US

## 2015-12-14 LAB — GC/CHLAMYDIA PROBE AMP (~~LOC~~) NOT AT ARMC
CHLAMYDIA, DNA PROBE: NEGATIVE
NEISSERIA GONORRHEA: NEGATIVE

## 2017-03-30 ENCOUNTER — Other Ambulatory Visit: Payer: Self-pay

## 2017-03-30 ENCOUNTER — Ambulatory Visit (INDEPENDENT_AMBULATORY_CARE_PROVIDER_SITE_OTHER): Payer: No Typology Code available for payment source | Admitting: Family Medicine

## 2017-03-30 ENCOUNTER — Encounter: Payer: Self-pay | Admitting: Family Medicine

## 2017-03-30 VITALS — BP 124/70 | HR 113 | Temp 97.9°F | Resp 16 | Ht 66.0 in | Wt 139.8 lb

## 2017-03-30 DIAGNOSIS — Z113 Encounter for screening for infections with a predominantly sexual mode of transmission: Secondary | ICD-10-CM

## 2017-03-30 DIAGNOSIS — Z3169 Encounter for other general counseling and advice on procreation: Secondary | ICD-10-CM

## 2017-03-30 DIAGNOSIS — Z0184 Encounter for antibody response examination: Secondary | ICD-10-CM

## 2017-03-30 DIAGNOSIS — R000 Tachycardia, unspecified: Secondary | ICD-10-CM

## 2017-03-30 MED ORDER — COMPLETENATE 29-1 MG PO CHEW: 1.0000 | CHEWABLE_TABLET | Freq: Every day | ORAL | 3 refills | Status: DC
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
## 2017-04-02 ENCOUNTER — Telehealth: Payer: Self-pay | Admitting: Family Medicine

## 2017-04-02 LAB — CBC WITH DIFFERENTIAL/PLATELET
BASOS ABS: 72 {cells}/uL (ref 0–200)
Basophils Relative: 1.1 %
EOS ABS: 91 {cells}/uL (ref 15–500)
Eosinophils Relative: 1.4 %
HCT: 40.1 % (ref 35.0–45.0)
Hemoglobin: 13.6 g/dL (ref 11.7–15.5)
Lymphs Abs: 1937 cells/uL (ref 850–3900)
MCH: 29.8 pg (ref 27.0–33.0)
MCHC: 33.9 g/dL (ref 32.0–36.0)
MCV: 87.7 fL (ref 80.0–100.0)
MPV: 13.9 fL — ABNORMAL HIGH (ref 7.5–12.5)
Monocytes Relative: 9.9 %
NEUTROS PCT: 57.8 %
Neutro Abs: 3757 cells/uL (ref 1500–7800)
PLATELETS: 200 10*3/uL (ref 140–400)
RBC: 4.57 10*6/uL (ref 3.80–5.10)
RDW: 11.6 % (ref 11.0–15.0)
TOTAL LYMPHOCYTE: 29.8 %
WBC: 6.5 10*3/uL (ref 3.8–10.8)
WBCMIX: 644 {cells}/uL (ref 200–950)

## 2017-04-02 LAB — BASIC METABOLIC PANEL
BUN: 9 mg/dL (ref 7–25)
CALCIUM: 10 mg/dL (ref 8.6–10.2)
CHLORIDE: 106 mmol/L (ref 98–110)
CO2: 27 mmol/L (ref 20–32)
Creat: 0.84 mg/dL (ref 0.50–1.10)
Glucose, Bld: 91 mg/dL (ref 65–139)
Potassium: 4.4 mmol/L (ref 3.5–5.3)
SODIUM: 142 mmol/L (ref 135–146)

## 2017-04-02 LAB — MEASLES/MUMPS/RUBELLA IMMUNITY
RUBELLA: 2.34 {index}
Rubeola IgG: 260 AU/mL

## 2017-04-02 LAB — HIV ANTIBODY (ROUTINE TESTING W REFLEX): HIV 1&2 Ab, 4th Generation: NONREACTIVE

## 2017-04-02 LAB — TSH: TSH: 3.82 m[IU]/L

## 2017-04-02 NOTE — Telephone Encounter (Signed)
Maria Cooper, [place], left message stating that the pre-natal vitamin sent in on Friday comes in orange flavor and patient is allergic to anything citrus. She wants to know if it is okay to switch to prenatal vitamin plus low iron. It still has 1 mg folic acid and 27 mg iron.  Callback# [phone]

## 2017-04-03 MED FILL — PRENATAL VITAMIN PLUS LOW I: 27-1 | 90 days supply | Qty: 90 | Fill #0

## 2017-04-03 NOTE — Telephone Encounter (Signed)
YesGwen Her. Mannie Stabile, MD

## 2017-04-03 NOTE — Telephone Encounter (Signed)
Left message with pharmacy.

## 2017-04-10 ENCOUNTER — Encounter: Payer: Self-pay | Admitting: Family Medicine

## 2017-04-10 ENCOUNTER — Telehealth: Payer: Self-pay | Admitting: Family Medicine

## 2017-04-10 DIAGNOSIS — E079 Disorder of thyroid, unspecified: Secondary | ICD-10-CM

## 2017-04-10 DIAGNOSIS — Z3169 Encounter for other general counseling and advice on procreation: Secondary | ICD-10-CM

## 2017-04-10 NOTE — Telephone Encounter (Signed)
I called and left message on patient answering machine to please call our office that I needed to speak with her regarding her labs. Gwen Her. Mannie Stabile, MD

## 2017-04-10 NOTE — Telephone Encounter (Signed)
Patient returned my call.  I spoke with her on the telephone.  I told her that her thyroid function tests were borderline for first trimester and that I would like for her to follow-up with GYN for preconception counseling and to discuss her thyroid function.  She is intending and trying to get pregnant at this time and therefore I feel that she needs monitoring of her TSH.  I did inform her of her immunity status testing for measles mumps and rubella.  She is rubella immune.  She is not immune to mumps.  She will discuss with GYN whether she needs to get the vaccination or not.  We did discuss that it is mainly rubella that we are concerned about for congenital problems in utero.  She reports that [facility] and schedule [facility].  Referral placed to Dr. Glo Herring.

## 2017-04-19 ENCOUNTER — Ambulatory Visit (INDEPENDENT_AMBULATORY_CARE_PROVIDER_SITE_OTHER): Payer: No Typology Code available for payment source | Admitting: Cardiology

## 2017-04-19 ENCOUNTER — Encounter: Payer: Self-pay | Admitting: Cardiology

## 2017-04-19 ENCOUNTER — Other Ambulatory Visit: Payer: Self-pay

## 2017-04-19 VITALS — BP 120/68 | HR 122 | Ht 66.0 in | Wt 142.0 lb

## 2017-04-19 DIAGNOSIS — R002 Palpitations: Secondary | ICD-10-CM | POA: Diagnosis not present

## 2017-04-19 DIAGNOSIS — R000 Tachycardia, unspecified: Secondary | ICD-10-CM

## 2017-04-19 NOTE — Patient Instructions (Signed)
Your physician recommends that you schedule a follow-up appointment in: TO [place]  Your physician recommends that you continue on your current medications as directed. Please refer to the Current Medication list given to you today.  Your physician has recommended that you wear a holter monitor FOR 24 HOURS. Holter monitors are medical devices that record the heart's electrical activity. Doctors most often use these monitors to diagnose arrhythmias. Arrhythmias are problems with the speed or rhythm of the heartbeat. The monitor is a small, portable device. You can wear one while you do your normal daily activities. This is usually used to diagnose what is causing palpitations/syncope (passing out).  Thank you for choosing [facility]!!
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
## 2017-04-22 ENCOUNTER — Encounter: Payer: Self-pay | Admitting: Cardiology

## 2017-04-24 ENCOUNTER — Encounter: Payer: Self-pay | Admitting: Adult Health

## 2017-04-24 ENCOUNTER — Ambulatory Visit: Payer: No Typology Code available for payment source | Admitting: Adult Health

## 2017-04-24 VITALS — BP 96/50 | HR 93 | Ht 66.0 in | Wt 138.5 lb

## 2017-04-24 DIAGNOSIS — Z1329 Encounter for screening for other suspected endocrine disorder: Secondary | ICD-10-CM

## 2017-04-24 DIAGNOSIS — Z319 Encounter for procreative management, unspecified: Secondary | ICD-10-CM | POA: Diagnosis not present

## 2017-04-24 NOTE — Progress Notes (Signed)
Subjective:     Patient ID: YOLANDE SKODA, female   DOB: 09-23-1995, 23 y.o.   MRN: [id number]  HPI Nianna is a 22 year old white female, married, G0P0, who works on [employer] at [employer], in to talk about getting pregnant.She had labs at Dr Roma Kayser and had TSH 3.82 and was not immune to mumps but was for rubeola and rubella.Pap is current.  PCP is Dr Mannie Stabile.  Review of Systems Periods about every 31 days Reviewed past medical,surgical, social and family history. Reviewed medications and allergies.     Objective:   Physical Exam BP (!) 96/50 (BP Location: Left Arm, Patient Position: Sitting, Cuff Size: Normal)   Pulse 93   Ht 5' 6"  (1.676 m)   Wt 138 lb 8 oz (62.8 kg)   LMP 04/24/2017 (Exact Date)   BMI 22.35 kg/m   PHQ 2 score 0. Skin warm and dry. Neck: mid line trachea, normal thyroid, good ROM, no lymphadenopathy noted. Will check Free T4.Discussed timing os sex and could take 6-18 months of active trying and will need to decide if wants to get another MMR has had 2 or proceed with trying to get pregnant.    Assessment:     1. Patient desires pregnancy   2. Screening for thyroid disorder       Plan:     Continue PNV Check Free T4  Will talk in next 48 hours  Review handout on preparing for pregnancy

## 2017-04-24 NOTE — Patient Instructions (Signed)
Preparing for Pregnancy If you are considering becoming pregnant, make an appointment to see your regular health care provider to learn how to prepare for a safe and healthy pregnancy (preconception care). During a preconception care visit, your health care provider will:  Do a complete physical exam, including a Pap test.  Take a complete medical history.  Give you information, answer your questions, and help you resolve problems.  Preconception checklist Medical history  Tell your health care provider about any current or past medical conditions. Your pregnancy or your ability to become pregnant may be affected by chronic conditions, such as diabetes, chronic hypertension, and thyroid problems.  Include your family's medical history as well as your partner's medical history.  Tell your health care provider about any history of STIs (sexually transmitted infections).These can affect your pregnancy. In some cases, they can be passed to your baby. Discuss any concerns that you have about STIs.  If indicated, discuss the benefits of genetic testing. This testing will show whether there are any genetic conditions that may be passed from you or your partner to your baby.  Tell your health care provider about: ? Any problems you have had with conception or pregnancy. ? Any medicines you take. These include vitamins, herbal supplements, and over-the-counter medicines. ? Your history of immunizations. Discuss any vaccinations that you may need.  Diet  Ask your health care provider what to include in a healthy diet that has a balance of nutrients. This is especially important when you are pregnant or preparing to become pregnant.  Ask your health care provider to help you reach a healthy weight before pregnancy. ? If you are overweight, you may be at higher risk for certain complications, such as high blood pressure, diabetes, and preterm birth. ? If you are underweight, you are more likely  to have a baby who has a low birth weight.  Lifestyle, work, and home  Let your health care provider know: ? About any lifestyle habits that you have, such as alcohol use, drug use, or smoking. ? About recreational activities that may put you at risk during pregnancy, such as downhill skiing and certain exercise programs. ? Tell your health care provider about any international travel, especially any travel to places with an active [place] virus outbreak. ? About harmful substances that you may be exposed to at work or at home. These include chemicals, pesticides, radiation, or even litter boxes. ? If you do not feel safe at home.  Mental health  Tell your health care provider about: ? Any history of mental health conditions, including feelings of depression, sadness, or anxiety. ? Any medicines that you take for a mental health condition. These include herbs and supplements.  Home instructions to prepare for pregnancy Lifestyle  Eat a balanced diet. This includes fresh fruits and vegetables, whole grains, lean meats, low-fat dairy products, healthy fats, and foods that are high in fiber. Ask to meet with a nutritionist or registered dietitian for assistance with meal planning and goals.  Get regular exercise. Try to be active for at least 30 minutes a day on most days of the week. Ask your health care provider which activities are safe during pregnancy.  Do not use any products that contain nicotine or tobacco, such as cigarettes and e-cigarettes. If you need help quitting, ask your health care provider.  Do not drink alcohol.  Do not take illegal drugs.  Maintain a healthy weight. Ask your health care provider what weight range is  right for you.  General instructions  Keep an accurate record of your menstrual periods. This makes it easier for your health care provider to determine your baby's due date.  Begin taking prenatal vitamins and folic acid supplements daily as directed by  your health care provider.  Manage any chronic conditions, such as high blood pressure and diabetes, as told by your health care provider. This is important.  How do I know that I am pregnant? You may be pregnant if you have been sexually active and you miss your period. Symptoms of early pregnancy include:  Mild cramping.  Very light vaginal bleeding (spotting).  Feeling unusually tired.  Nausea and vomiting (morning sickness).  If you have any of these symptoms and you suspect that you might be pregnant, you can take a home pregnancy test. These tests check for a hormone in your urine (human chorionic gonadotropin, or hCG). A woman's body begins to make this hormone during early pregnancy. These tests are very accurate. Wait until at least the first day after you miss your period to take one. If the test shows that you are pregnant (you get a positive result), call your health care provider to make an appointment for prenatal care. What should I do if I become pregnant?  Make an appointment with your health care provider as soon as you suspect you are pregnant.  Do not use any products that contain nicotine, such as cigarettes, chewing tobacco, and e-cigarettes. If you need help quitting, ask your health care provider.  Do not drink alcoholic beverages. Alcohol is related to a number of birth defects.  Avoid toxic odors and chemicals.  You may continue to have sexual intercourse if it does not cause pain or other problems, such as vaginal bleeding. This information is not intended to replace advice given to you by your health care provider. Make sure you discuss any questions you have with your health care provider. Document Released: 02/10/2008 Document Revised: 10/26/2015 Document Reviewed: 09/19/2015 Elsevier Interactive Patient Education  [employer].

## 2017-04-25 LAB — T4, FREE: Free T4: 1.01 ng/dL (ref 0.82–1.77)

## 2017-04-26 ENCOUNTER — Telehealth: Payer: Self-pay | Admitting: Adult Health

## 2017-04-26 NOTE — Telephone Encounter (Signed)
Pt aware that Free T4 normal, can rx synthroid to get TSH between 1-2 and if desired , she will think about it and let us know.

## 2017-05-01 ENCOUNTER — Telehealth: Payer: Self-pay | Admitting: Adult Health

## 2017-05-01 NOTE — Telephone Encounter (Signed)
Patient called stating that she wold like to speak with Anderson Malta, Patient did not state the reason why. Please contact pt

## 2017-05-01 NOTE — Telephone Encounter (Signed)
She declines synthroid at this time, that is fine

## 2017-05-01 NOTE — Telephone Encounter (Signed)
Pt called stating that Syvilla had told her to let her know if she wanted to start the thyroid medication. Pt states that she would like to hold off for a couple of months. Informed pt that I would let Chrystina know.

## 2017-05-03 ENCOUNTER — Telehealth: Payer: Self-pay | Admitting: *Deleted

## 2017-05-03 NOTE — Telephone Encounter (Signed)
Pt aware and voiced understanding - routed to pcp

## 2017-05-03 NOTE — Telephone Encounter (Signed)
-----   Message from Arnoldo Lenis, MD sent at 05/03/2017 12:59 PM EST ----- Normal heart monitor. Her average heart rate is 69 which is normal. No abnormal rhythms noted. No further workup planned from cardiac standpoint, can f/u as needed   Carlyle Dolly MD

## 2017-05-21 ENCOUNTER — Telehealth: Payer: Self-pay | Admitting: Family Medicine

## 2017-05-21 NOTE — Telephone Encounter (Signed)
Patient recently found out she is [redacted] weeks pregnant, she wants to know if she needs to followup with Dr.Hagler as well as her obgyn

## 2017-05-22 NOTE — Telephone Encounter (Signed)
Tell her, "Congratulations!!". She can follow up with Ob/Gyn. I would like for her to follow up sooner than later due to her thyroid status. She can call and advise them that she is pregnant and had borderline thyroid. I know her TSH was slightly elevated and saw that her Free T4 was normal. Ask them when they would like her to be seen. Tell her we are here if she needs anything.  Gwen Her. Mannie Stabile, MD

## 2017-05-22 NOTE — Telephone Encounter (Signed)
Patient informed of message below, verbalized understanding.  

## 2017-05-29 ENCOUNTER — Encounter: Payer: Self-pay | Admitting: Adult Health

## 2017-05-29 ENCOUNTER — Ambulatory Visit: Payer: No Typology Code available for payment source | Admitting: Adult Health

## 2017-05-29 VITALS — BP 110/60 | HR 98 | Ht 66.0 in | Wt 136.0 lb

## 2017-05-29 DIAGNOSIS — Z3201 Encounter for pregnancy test, result positive: Secondary | ICD-10-CM | POA: Diagnosis not present

## 2017-05-29 DIAGNOSIS — N926 Irregular menstruation, unspecified: Secondary | ICD-10-CM | POA: Diagnosis not present

## 2017-05-29 DIAGNOSIS — O3680X0 Pregnancy with inconclusive fetal viability, not applicable or unspecified: Secondary | ICD-10-CM

## 2017-05-29 DIAGNOSIS — R102 Pelvic and perineal pain: Secondary | ICD-10-CM | POA: Diagnosis not present

## 2017-05-29 DIAGNOSIS — Z3A01 Less than 8 weeks gestation of pregnancy: Secondary | ICD-10-CM

## 2017-05-29 LAB — POCT URINE PREGNANCY: PREG TEST UR: POSITIVE — AB

## 2017-05-29 NOTE — Progress Notes (Signed)
Subjective:     Patient ID: WINNONA WARGO, female   DOB: [birth year], 22 y.o.   MRN: [id number]  HPI Emerita is a 22 year old white female in for UPT, has missed a period and had 2+HPTs, some cramping no bleeding.  Review of Systems +missed period with 2+HPTs +cramping at times, no bleeding Reviewed past medical,surgical, social and family history. Reviewed medications and allergies.     Objective:   Physical Exam BP 110/60 (BP Location: Left Arm, Patient Position: Sitting, Cuff Size: Normal)   Pulse 98   Ht 5' 6"  (1.676 m)   Wt 136 lb (61.7 kg)   LMP 04/24/2017   BMI 21.95 kg/m UPT +about 5 weeks by LMP with EDD 01/29/18.Skin warm and dry. Neck: mid line trachea, normal thyroid, good ROM, no lymphadenopathy noted. Lungs: clear to ausculation bilaterally. Cardiovascular: regular rate and rhythm. Abdomen is soft and non tender.     Assessment:     1. Pregnancy examination or test, positive result   2. Less than [redacted] weeks gestation of pregnancy   3. Encounter to determine fetal viability of pregnancy, single or unspecified fetus       Plan:     Continue PNV Return in 2 weeks for dating [place] Review handouts on First trimester and by [facility]

## 2017-05-29 NOTE — Patient Instructions (Signed)
First Trimester of Pregnancy The first trimester of pregnancy is from week 1 until the end of week 13 (months 1 through 3). A week after a sperm fertilizes an egg, the egg will implant on the wall of the uterus. This embryo will begin to develop into a baby. Genes from you and your partner will form the baby. The female genes will determine whether the baby will be a boy or a girl. At 6-8 weeks, the eyes and face will be formed, and the heartbeat can be seen on ultrasound. At the end of 12 weeks, all the baby's organs will be formed. Now that you are pregnant, you will want to do everything you can to have a healthy baby. Two of the most important things are to get good prenatal care and to follow your health care provider's instructions. Prenatal care is all the medical care you receive before the baby's birth. This care will help prevent, find, and treat any problems during the pregnancy and childbirth. Body changes during your first trimester Your body goes through many changes during pregnancy. The changes vary from woman to woman.  You may gain or lose a couple of pounds at first.  You may feel sick to your stomach (nauseous) and you may throw up (vomit). If the vomiting is uncontrollable, call your health care provider.  You may tire easily.  You may develop headaches that can be relieved by medicines. All medicines should be approved by your health care provider.  You may urinate more often. Painful urination may mean you have a bladder infection.  You may develop heartburn as a result of your pregnancy.  You may develop constipation because certain hormones are causing the muscles that push stool through your intestines to slow down.  You may develop hemorrhoids or swollen veins (varicose veins).  Your breasts may begin to grow larger and become tender. Your nipples may stick out more, and the tissue that surrounds them (areola) may become darker.  Your gums may bleed and may be  sensitive to brushing and flossing.  Dark spots or blotches (chloasma, mask of pregnancy) may develop on your face. This will likely fade after the baby is born.  Your menstrual periods will stop.  You may have a loss of appetite.  You may develop cravings for certain kinds of food.  You may have changes in your emotions from day to day, such as being excited to be pregnant or being concerned that something may go wrong with the pregnancy and baby.  You may have more vivid and strange dreams.  You may have changes in your hair. These can include thickening of your hair, rapid growth, and changes in texture. Some women also have hair loss during or after pregnancy, or hair that feels dry or thin. Your hair will most likely return to normal after your baby is born.  What to expect at prenatal visits During a routine prenatal visit:  You will be weighed to make sure you and the baby are growing normally.  Your blood pressure will be taken.  Your abdomen will be measured to track your baby's growth.  The fetal heartbeat will be listened to between weeks 10 and 14 of your pregnancy.  Test results from any previous visits will be discussed.  Your health care provider may ask you:  How you are feeling.  If you are feeling the baby move.  If you have had any abnormal symptoms, such as leaking fluid, bleeding, severe headaches,  or abdominal cramping.  If you are using any tobacco products, including cigarettes, chewing tobacco, and electronic cigarettes.  If you have any questions.  Other tests that may be performed during your first trimester include:  Blood tests to find your blood type and to check for the presence of any previous infections. The tests will also be used to check for low iron levels (anemia) and protein on red blood cells (Rh antibodies). Depending on your risk factors, or if you previously had diabetes during pregnancy, you may have tests to check for high blood  sugar that affects pregnant women (gestational diabetes).  Urine tests to check for infections, diabetes, or protein in the urine.  An ultrasound to confirm the proper growth and development of the baby.  Fetal screens for spinal cord problems (spina bifida) and Down syndrome.  HIV (human immunodeficiency virus) testing. Routine prenatal testing includes screening for HIV, unless you choose not to have this test.  You may need other tests to make sure you and the baby are doing well.  Follow these instructions at home: Medicines  Follow your health care provider's instructions regarding medicine use. Specific medicines may be either safe or unsafe to take during pregnancy.  Take a prenatal vitamin that contains at least 600 micrograms (mcg) of folic acid.  If you develop constipation, try taking a stool softener if your health care provider approves. Eating and drinking  Eat a balanced diet that includes fresh fruits and vegetables, whole grains, good sources of protein such as meat, eggs, or tofu, and low-fat dairy. Your health care provider will help you determine the amount of weight gain that is right for you.  Avoid raw meat and uncooked cheese. These carry germs that can cause birth defects in the baby.  Eating four or five small meals rather than three large meals a day may help relieve nausea and vomiting. If you start to feel nauseous, eating a few soda crackers can be helpful. Drinking liquids between meals, instead of during meals, also seems to help ease nausea and vomiting.  Limit foods that are high in fat and processed sugars, such as fried and sweet foods.  To prevent constipation: ? Eat foods that are high in fiber, such as fresh fruits and vegetables, whole grains, and beans. ? Drink enough fluid to keep your urine clear or pale yellow. Activity  Exercise only as directed by your health care provider. Most women can continue their usual exercise routine during  pregnancy. Try to exercise for 30 minutes at least 5 days a week. Exercising will help you: ? Control your weight. ? Stay in shape. ? Be prepared for labor and delivery.  Experiencing pain or cramping in the lower abdomen or lower back is a good sign that you should stop exercising. Check with your health care provider before continuing with normal exercises.  Try to avoid standing for long periods of time. Move your legs often if you must stand in one place for a long time.  Avoid heavy lifting.  Wear low-heeled shoes and practice good posture.  You may continue to have sex unless your health care provider tells you not to. Relieving pain and discomfort  Wear a good support bra to relieve breast tenderness.  Take warm sitz baths to soothe any pain or discomfort caused by hemorrhoids. Use hemorrhoid cream if your health care provider approves.  Rest with your legs elevated if you have leg cramps or low back pain.  If you develop  varicose veins in your legs, wear support hose. Elevate your feet for 15 minutes, 3-4 times a day. Limit salt in your diet. Prenatal care  Schedule your prenatal visits by the twelfth week of pregnancy. They are usually scheduled monthly at first, then more often in the last 2 months before delivery.  Write down your questions. Take them to your prenatal visits.  Keep all your prenatal visits as told by your health care provider. This is important. Safety  Wear your seat belt at all times when driving.  Make a list of emergency phone numbers, including numbers for family, friends, the hospital, and police and fire departments. General instructions  Ask your health care provider for a referral to a local prenatal education class. Begin classes no later than the beginning of month 6 of your pregnancy.  Ask for help if you have counseling or nutritional needs during pregnancy. Your health care provider can offer advice or refer you to specialists for help  with various needs.  Do not use hot tubs, steam rooms, or saunas.  Do not douche or use tampons or scented sanitary pads.  Do not cross your legs for long periods of time.  Avoid cat litter boxes and soil used by cats. These carry germs that can cause birth defects in the baby and possibly loss of the fetus by miscarriage or stillbirth.  Avoid all smoking, herbs, alcohol, and medicines not prescribed by your health care provider. Chemicals in these products affect the formation and growth of the baby.  Do not use any products that contain nicotine or tobacco, such as cigarettes and e-cigarettes. If you need help quitting, ask your health care provider. You may receive counseling support and other resources to help you quit.  Schedule a dentist appointment. At home, brush your teeth with a soft toothbrush and be gentle when you floss. Contact a health care provider if:  You have dizziness.  You have mild pelvic cramps, pelvic pressure, or nagging pain in the abdominal area.  You have persistent nausea, vomiting, or diarrhea.  You have a bad smelling vaginal discharge.  You have pain when you urinate.  You notice increased swelling in your face, hands, legs, or ankles.  You are exposed to fifth disease or chickenpox.  You are exposed to [place] measles (rubella) and have never had it. Get help right away if:  You have a fever.  You are leaking fluid from your vagina.  You have spotting or bleeding from your vagina.  You have severe abdominal cramping or pain.  You have rapid weight gain or loss.  You vomit blood or material that looks like coffee grounds.  You develop a severe headache.  You have shortness of breath.  You have any kind of trauma, such as from a fall or a car accident. Summary  The first trimester of pregnancy is from week 1 until the end of week 13 (months 1 through 3).  Your body goes through many changes during pregnancy. The changes vary from  woman to woman.  You will have routine prenatal visits. During those visits, your health care provider will examine you, discuss any test results you may have, and talk with you about how you are feeling. This information is not intended to replace advice given to you by your health care provider. Make sure you discuss any questions you have with your health care provider. Document Released: 02/21/2001 Document Revised: 02/09/2016 Document Reviewed: 02/09/2016 Elsevier Interactive Patient Education  2018 [employer]

## 2017-06-12 ENCOUNTER — Ambulatory Visit (INDEPENDENT_AMBULATORY_CARE_PROVIDER_SITE_OTHER): Payer: No Typology Code available for payment source

## 2017-06-12 DIAGNOSIS — N83201 Unspecified ovarian cyst, right side: Secondary | ICD-10-CM

## 2017-06-12 DIAGNOSIS — Z3A01 Less than 8 weeks gestation of pregnancy: Secondary | ICD-10-CM | POA: Diagnosis not present

## 2017-06-12 DIAGNOSIS — O3680X0 Pregnancy with inconclusive fetal viability, not applicable or unspecified: Secondary | ICD-10-CM

## 2017-06-12 DIAGNOSIS — O3481 Maternal care for other abnormalities of pelvic organs, first trimester: Secondary | ICD-10-CM

## 2017-06-12 NOTE — Progress Notes (Signed)
[place] 7 wks,single IUP w/ys,positive fht 145 bpm,normal left ovary,simple right ovarian cyst 4.4 x 4.5 x 4.6 cm,crl 9.87 mm

## 2017-07-02 ENCOUNTER — Ambulatory Visit: Payer: No Typology Code available for payment source | Admitting: *Deleted

## 2017-07-02 ENCOUNTER — Ambulatory Visit (INDEPENDENT_AMBULATORY_CARE_PROVIDER_SITE_OTHER): Payer: No Typology Code available for payment source | Admitting: Women's Health

## 2017-07-02 ENCOUNTER — Encounter: Payer: Self-pay | Admitting: Women's Health

## 2017-07-02 VITALS — BP 110/70 | HR 98 | Wt 132.0 lb

## 2017-07-02 DIAGNOSIS — Z1389 Encounter for screening for other disorder: Secondary | ICD-10-CM

## 2017-07-02 DIAGNOSIS — O9989 Other specified diseases and conditions complicating pregnancy, childbirth and the puerperium: Secondary | ICD-10-CM

## 2017-07-02 DIAGNOSIS — Z3400 Encounter for supervision of normal first pregnancy, unspecified trimester: Secondary | ICD-10-CM

## 2017-07-02 DIAGNOSIS — Z3401 Encounter for supervision of normal first pregnancy, first trimester: Secondary | ICD-10-CM

## 2017-07-02 DIAGNOSIS — Z3A09 9 weeks gestation of pregnancy: Secondary | ICD-10-CM

## 2017-07-02 DIAGNOSIS — M545 Low back pain: Secondary | ICD-10-CM

## 2017-07-02 DIAGNOSIS — O0990 Supervision of high risk pregnancy, unspecified, unspecified trimester: Secondary | ICD-10-CM | POA: Insufficient documentation

## 2017-07-02 DIAGNOSIS — Z331 Pregnant state, incidental: Secondary | ICD-10-CM

## 2017-07-02 DIAGNOSIS — Z3682 Encounter for antenatal screening for nuchal translucency: Secondary | ICD-10-CM

## 2017-07-02 LAB — POCT URINALYSIS DIPSTICK
Glucose, UA: NEGATIVE
Ketones, UA: NEGATIVE
Nitrite, UA: NEGATIVE
Protein, UA: NEGATIVE
RBC UA: NEGATIVE

## 2017-07-02 NOTE — Progress Notes (Signed)
INITIAL OBSTETRICAL VISIT Patient name: Maria Cooper MRN [id number]  Date of birth: 1995-09-02 Chief Complaint:   Initial Prenatal Visit  History of Present Illness:   Maria Cooper is a 22 y.o. G63P0000 Caucasian female at 25w6dby LMP c/w 7wk u/s, with an Estimated Date of Delivery: 01/29/18 being seen today for her initial obstetrical visit.   Her obstetrical history is significant for primigravida.   Today she reports some low back pain.  Patient's last menstrual period was 04/24/2017. [facility] Results were: normal Review of Systems:   Pertinent items are noted in HPI Denies cramping/contractions, leakage of fluid, vaginal bleeding, abnormal vaginal discharge w/ itching/odor/irritation, headaches, visual changes, shortness of breath, chest pain, abdominal pain, severe nausea/vomiting, or problems with urination or bowel movements unless otherwise stated above.  Pertinent History Reviewed:  Reviewed past medical,surgical, social, obstetrical and family history.  Reviewed problem list, medications and allergies. OB History  Gravida Para Term Preterm AB Living  1 0 0 0 0 0  SAB TAB Ectopic Multiple Live Births  0 0 0 0 0    # Outcome Date GA Lbr Len/2nd Weight Sex Delivery Anes PTL Lv  1 Current            Physical Assessment:   Vitals:   07/02/17 1435  BP: 110/70  Pulse: 98  Weight: 132 lb (59.9 kg)  Body mass index is 21.31 kg/m.       Physical Examination:  General appearance - well appearing, and in no distress  Mental status - alert, oriented to person, place, and time  Psych:  She has a normal mood and affect  Skin - warm and dry, normal color, no suspicious lesions noted  Chest - effort normal, all lung fields clear to auscultation bilaterally  Heart - normal rate and regular rhythm  Abdomen - soft, nontender  Extremities:  No swelling or varicosities noted  Thin prep pap is not done   Fetal Heart Rate (bpm): +u/s via informal  transabdominal u/s, +active fetus  Results for orders placed or performed in visit on 07/02/17 (from the past 24 hour(s))  POCT urinalysis dipstick   Collection Time: 07/02/17  2:49 PM  Result Value Ref Range   Color, UA     Clarity, UA     Glucose, UA neg    Bilirubin, UA     Ketones, UA neg    Spec Grav, UA  1.010 - 1.025   Blood, UA neg    pH, UA  5.0 - 8.0   Protein, UA neg    Urobilinogen, UA  0.2 or 1.0 E.U./dL   Nitrite, UA neg    Leukocytes, UA Trace (A) Negative   Appearance     Odor      Assessment & Plan:  1) Low-Risk Pregnancy G1P0000 at 984w6dith an Estimated Date of Delivery: 01/29/18   2) Initial OB visit  Meds: No orders of the defined types were placed in this encounter.   Initial labs obtained Continue prenatal vitamins Reviewed n/v relief measures and warning s/s to report Reviewed recommended weight gain based on pre-gravid BMI Encouraged well-balanced diet Genetic Screening discussed Integrated Screen: requested Cystic fibrosis screening discussed declined Ultrasound discussed; fetal survey: requested CCNC completed>not sent, not applying for preg mcaid Opted for [employer]ized Baby[employer]s   Follow-up: Return in about 3 weeks (around 07/24/2017) for LROB, US:NT+1stIT.   Orders Placed This Encounter  Procedures  . Urine Culture  . GC/Chlamydia  Probe Amp  . US Fetal Nuchal Translucency Measurement  . Obstetric Panel, Including HIV  . Urinalysis, Routine w reflex microscopic  . Pain Management Screening Profile (10S)  . POCT urinalysis dipstick    Roma Schanz CNM, [facility] 07/02/2017 3:56 PM

## 2017-07-02 NOTE — Patient Instructions (Signed)
[name], I greatly value your feedback.  If you receive a survey following your visit with [place] today, we appreciate you taking the time to fill it out.  Thanks, Knute Neu, CNM, WHNP-BC   Nausea & Vomiting  Have saltine crackers or pretzels by your bed and eat a few bites before you raise your head out of bed in the morning  Eat small frequent meals throughout the day instead of large meals  Drink plenty of fluids throughout the day to stay hydrated, just don't drink a lot of fluids with your meals.  This can make your stomach fill up faster making you feel sick  Do not brush your teeth right after you eat  Products with real ginger are good for nausea, like ginger ale and ginger hard candy Make sure it says made with real ginger!  Sucking on sour candy like lemon heads is also good for nausea  If your prenatal vitamins make you nauseated, take them at night so you will sleep through the nausea  Sea Bands  If you feel like you need medicine for the nausea & vomiting please let us know  If you are unable to keep any fluids or food down please let us know   Constipation  Drink plenty of fluid, preferably water, throughout the day  Eat foods high in fiber such as fruits, vegetables, and grains  Exercise, such as walking, is a good way to keep your bowels regular  Drink warm fluids, especially warm prune juice, or decaf coffee  Eat a 1/2 cup of real oatmeal (not instant), 1/2 cup applesauce, and 1/2-1 cup warm prune juice every day  If needed, you may take Colace (docusate sodium) stool softener once or twice a day to help keep the stool soft. If you are pregnant, wait until you are out of your first trimester (12-14 weeks of pregnancy)  If you still are having problems with constipation, you may take Miralax once daily as needed to help keep your bowels regular.  If you are pregnant, wait until you are out of your first trimester (12-14 weeks of pregnancy)   First  Trimester of Pregnancy The first trimester of pregnancy is from week 1 until the end of week 12 (months 1 through 3). A week after a sperm fertilizes an egg, the egg will implant on the wall of the uterus. This embryo will begin to develop into a baby. Genes from you and your partner are forming the baby. The female genes determine whether the baby is a boy or a girl. At 6-8 weeks, the eyes and face are formed, and the heartbeat can be seen on ultrasound. At the end of 12 weeks, all the baby's organs are formed.  Now that you are pregnant, you will want to do everything you can to have a healthy baby. Two of the most important things are to get good prenatal care and to follow your health care provider's instructions. Prenatal care is all the medical care you receive before the baby's birth. This care will help prevent, find, and treat any problems during the pregnancy and childbirth. BODY CHANGES Your body goes through many changes during pregnancy. The changes vary from woman to woman.   You may gain or lose a couple of pounds at first.  You may feel sick to your stomach (nauseous) and throw up (vomit). If the vomiting is uncontrollable, call your health care provider.  You may tire easily.  You may develop headaches  that can be relieved by medicines approved by your health care provider.  You may urinate more often. Painful urination may mean you have a bladder infection.  You may develop heartburn as a result of your pregnancy.  You may develop constipation because certain hormones are causing the muscles that push waste through your intestines to slow down.  You may develop hemorrhoids or swollen, bulging veins (varicose veins).  Your breasts may begin to grow larger and become tender. Your nipples may stick out more, and the tissue that surrounds them (areola) may become darker.  Your gums may bleed and may be sensitive to brushing and flossing.  Dark spots or blotches (chloasma, mask  of pregnancy) may develop on your face. This will likely fade after the baby is born.  Your menstrual periods will stop.  You may have a loss of appetite.  You may develop cravings for certain kinds of food.  You may have changes in your emotions from day to day, such as being excited to be pregnant or being concerned that something may go wrong with the pregnancy and baby.  You may have more vivid and strange dreams.  You may have changes in your hair. These can include thickening of your hair, rapid growth, and changes in texture. Some women also have hair loss during or after pregnancy, or hair that feels dry or thin. Your hair will most likely return to normal after your baby is born. WHAT TO EXPECT AT YOUR PRENATAL VISITS During a routine prenatal visit:  You will be weighed to make sure you and the baby are growing normally.  Your blood pressure will be taken.  Your abdomen will be measured to track your baby's growth.  The fetal heartbeat will be listened to starting around week 10 or 12 of your pregnancy.  Test results from any previous visits will be discussed. Your health care provider may ask you:  How you are feeling.  If you are feeling the baby move.  If you have had any abnormal symptoms, such as leaking fluid, bleeding, severe headaches, or abdominal cramping.  If you have any questions. Other tests that may be performed during your first trimester include:  Blood tests to find your blood type and to check for the presence of any previous infections. They will also be used to check for low iron levels (anemia) and Rh antibodies. Later in the pregnancy, blood tests for diabetes will be done along with other tests if problems develop.  Urine tests to check for infections, diabetes, or protein in the urine.  An ultrasound to confirm the proper growth and development of the baby.  An amniocentesis to check for possible genetic problems.  Fetal screens for spina  bifida and Down syndrome.  You may need other tests to make sure you and the baby are doing well. HOME CARE INSTRUCTIONS  Medicines  Follow your health care provider's instructions regarding medicine use. Specific medicines may be either safe or unsafe to take during pregnancy.  Take your prenatal vitamins as directed.  If you develop constipation, try taking a stool softener if your health care provider approves. Diet  Eat regular, well-balanced meals. Choose a variety of foods, such as meat or vegetable-based protein, fish, milk and low-fat dairy products, vegetables, fruits, and whole grain breads and cereals. Your health care provider will help you determine the amount of weight gain that is right for you.  Avoid raw meat and uncooked cheese. These carry germs that can  cause birth defects in the baby.  Eating four or five small meals rather than three large meals a day may help relieve nausea and vomiting. If you start to feel nauseous, eating a few soda crackers can be helpful. Drinking liquids between meals instead of during meals also seems to help nausea and vomiting.  If you develop constipation, eat more high-fiber foods, such as fresh vegetables or fruit and whole grains. Drink enough fluids to keep your urine clear or pale yellow. Activity and Exercise  Exercise only as directed by your health care provider. Exercising will help you:  Control your weight.  Stay in shape.  Be prepared for labor and delivery.  Experiencing pain or cramping in the lower abdomen or low back is a good sign that you should stop exercising. Check with your health care provider before continuing normal exercises.  Try to avoid standing for long periods of time. Move your legs often if you must stand in one place for a long time.  Avoid heavy lifting.  Wear low-heeled shoes, and practice good posture.  You may continue to have sex unless your health care provider directs you  otherwise. Relief of Pain or Discomfort  Wear a good support bra for breast tenderness.   Take warm sitz baths to soothe any pain or discomfort caused by hemorrhoids. Use hemorrhoid cream if your health care provider approves.   Rest with your legs elevated if you have leg cramps or low back pain.  If you develop varicose veins in your legs, wear support hose. Elevate your feet for 15 minutes, 3-4 times a day. Limit salt in your diet. Prenatal Care  Schedule your prenatal visits by the twelfth week of pregnancy. They are usually scheduled monthly at first, then more often in the last 2 months before delivery.  Write down your questions. Take them to your prenatal visits.  Keep all your prenatal visits as directed by your health care provider. Safety  Wear your seat belt at all times when driving.  Make a list of emergency phone numbers, including numbers for family, friends, the hospital, and police and fire departments. General Tips  Ask your health care provider for a referral to a local prenatal education class. Begin classes no later than at the beginning of month 6 of your pregnancy.  Ask for help if you have counseling or nutritional needs during pregnancy. Your health care provider can offer advice or refer you to specialists for help with various needs.  Do not use hot tubs, steam rooms, or saunas.  Do not douche or use tampons or scented sanitary pads.  Do not cross your legs for long periods of time.  Avoid cat litter boxes and soil used by cats. These carry germs that can cause birth defects in the baby and possibly loss of the fetus by miscarriage or stillbirth.  Avoid all smoking, herbs, alcohol, and medicines not prescribed by your health care provider. Chemicals in these affect the formation and growth of the baby.  Schedule a dentist appointment. At home, brush your teeth with a soft toothbrush and be gentle when you floss. SEEK MEDICAL CARE IF:   You have  dizziness.  You have mild pelvic cramps, pelvic pressure, or nagging pain in the abdominal area.  You have persistent nausea, vomiting, or diarrhea.  You have a bad smelling vaginal discharge.  You have pain with urination.  You notice increased swelling in your face, hands, legs, or ankles. SEEK IMMEDIATE MEDICAL CARE IF:  You have a fever.  You are leaking fluid from your vagina.  You have spotting or bleeding from your vagina.  You have severe abdominal cramping or pain.  You have rapid weight gain or loss.  You vomit blood or material that looks like coffee grounds.  You are exposed to [place] measles and have never had them.  You are exposed to fifth disease or chickenpox.  You develop a severe headache.  You have shortness of breath.  You have any kind of trauma, such as from a fall or a car accident. Document Released: 02/21/2001 Document Revised: 07/14/2013 Document Reviewed: 01/07/2013 [facility] Patient Information 2015 [place], [place]. This information is not intended to replace advice given to you by your health care provider. Make sure you discuss any questions you have with your health care provider.

## 2017-07-03 LAB — OBSTETRIC PANEL, INCLUDING HIV
Antibody Screen: NEGATIVE
Basophils Absolute: 0 10*3/uL (ref 0.0–0.2)
Basos: 1 %
EOS (ABSOLUTE): 0 10*3/uL (ref 0.0–0.4)
Eos: 0 %
HEMATOCRIT: 41.6 % (ref 34.0–46.6)
HIV SCREEN 4TH GENERATION: NONREACTIVE
Hemoglobin: 13.7 g/dL (ref 11.1–15.9)
Hepatitis B Surface Ag: NEGATIVE
Immature Grans (Abs): 0 10*3/uL (ref 0.0–0.1)
Immature Granulocytes: 0 %
Lymphocytes Absolute: 1.4 10*3/uL (ref 0.7–3.1)
Lymphs: 17 %
MCH: 29.7 pg (ref 26.6–33.0)
MCHC: 32.9 g/dL (ref 31.5–35.7)
MCV: 90 fL (ref 79–97)
MONOCYTES: 8 %
Monocytes Absolute: 0.7 10*3/uL (ref 0.1–0.9)
NEUTROS ABS: 6.5 10*3/uL (ref 1.4–7.0)
Neutrophils: 74 %
Platelets: 163 10*3/uL (ref 150–379)
RBC: 4.61 x10E6/uL (ref 3.77–5.28)
RDW: 13.4 % (ref 12.3–15.4)
RH TYPE: POSITIVE
RPR: NONREACTIVE
RUBELLA: 3.12 {index} (ref >0.99)
WBC: 8.7 10*3/uL (ref 3.4–10.8)

## 2017-07-03 LAB — PMP SCREEN PROFILE (10S), URINE
Amphetamine Scrn, Ur: NEGATIVE ng/mL
BARBITURATE SCREEN URINE: NEGATIVE ng/mL
BENZODIAZEPINE SCREEN, URINE: NEGATIVE ng/mL
CANNABINOIDS UR QL SCN: NEGATIVE ng/mL
CREATININE(CRT), U: 122.8 mg/dL (ref 20.0–300.0)
Cocaine (Metab) Scrn, Ur: NEGATIVE ng/mL
Methadone Screen, Urine: NEGATIVE ng/mL
OXYCODONE+OXYMORPHONE UR QL SCN: NEGATIVE ng/mL
Opiate Scrn, Ur: NEGATIVE ng/mL
PH UR, DRUG SCRN: 5.2 (ref 4.5–8.9)
Phencyclidine Qn, Ur: NEGATIVE ng/mL
Propoxyphene Scrn, Ur: NEGATIVE ng/mL

## 2017-07-03 LAB — MED LIST OPTION NOT SELECTED

## 2017-07-03 LAB — MICROSCOPIC EXAMINATION
Casts: NONE SEEN /lpf
Epithelial Cells (non renal): >10 /hpf — AB (ref 0–10)

## 2017-07-03 LAB — URINALYSIS, ROUTINE W REFLEX MICROSCOPIC
Bilirubin, UA: NEGATIVE
Glucose, UA: NEGATIVE
Ketones, UA: NEGATIVE
Nitrite, UA: NEGATIVE
PH UA: 5 (ref 5.0–7.5)
RBC UA: NEGATIVE
Specific Gravity, UA: 1.017 (ref 1.005–1.030)
Urobilinogen, Ur: 0.2 mg/dL (ref 0.2–1.0)

## 2017-07-04 LAB — GC/CHLAMYDIA PROBE AMP
Chlamydia trachomatis, NAA: NEGATIVE
NEISSERIA GONORRHOEAE BY PCR: NEGATIVE

## 2017-07-04 LAB — URINE CULTURE: Organism ID, Bacteria: NO GROWTH

## 2017-07-09 MED FILL — PRENATAL VITAMIN PLUS LOW I: 27-1 | 90 days supply | Qty: 90 | Fill #1

## 2017-07-24 ENCOUNTER — Other Ambulatory Visit: Payer: No Typology Code available for payment source

## 2017-07-24 ENCOUNTER — Encounter: Payer: No Typology Code available for payment source | Admitting: Obstetrics & Gynecology

## 2017-07-26 ENCOUNTER — Ambulatory Visit (INDEPENDENT_AMBULATORY_CARE_PROVIDER_SITE_OTHER): Payer: No Typology Code available for payment source | Admitting: Women's Health

## 2017-07-26 ENCOUNTER — Encounter: Payer: Self-pay | Admitting: Women's Health

## 2017-07-26 VITALS — BP 100/60 | HR 98 | Temp 98.2°F | Wt 135.2 lb

## 2017-07-26 DIAGNOSIS — Z1389 Encounter for screening for other disorder: Secondary | ICD-10-CM

## 2017-07-26 DIAGNOSIS — Z3A13 13 weeks gestation of pregnancy: Secondary | ICD-10-CM

## 2017-07-26 DIAGNOSIS — Z3401 Encounter for supervision of normal first pregnancy, first trimester: Secondary | ICD-10-CM

## 2017-07-26 DIAGNOSIS — R300 Dysuria: Secondary | ICD-10-CM

## 2017-07-26 DIAGNOSIS — Z363 Encounter for antenatal screening for malformations: Secondary | ICD-10-CM

## 2017-07-26 DIAGNOSIS — Z331 Pregnant state, incidental: Secondary | ICD-10-CM

## 2017-07-26 DIAGNOSIS — O26891 Other specified pregnancy related conditions, first trimester: Secondary | ICD-10-CM

## 2017-07-26 LAB — POCT URINALYSIS DIPSTICK
Glucose, UA: NEGATIVE
Ketones, UA: NEGATIVE
Leukocytes, UA: NEGATIVE
NITRITE UA: NEGATIVE
RBC UA: NEGATIVE

## 2017-07-26 MED ORDER — CEPHALEXIN 500 MG PO CAPS: 500.0000 mg | ORAL_CAPSULE | Freq: Four times a day (QID) | ORAL | 0 refills | Status: DC

## 2017-07-26 MED FILL — CEPHALEXIN 500 MG CAPSULE: 500 | 7 days supply | Qty: 28 | Fill #0

## 2017-07-26 NOTE — Patient Instructions (Signed)
Maria Cooper, I greatly value your feedback.  If you receive a survey following your visit with [place] today, we appreciate you taking the time to fill it out.  Thanks, Knute Neu, CNM, WHNP-BC   Second Trimester of Pregnancy The second trimester is from week 14 through week 27 (months 4 through 6). The second trimester is often a time when you feel your best. Your body has adjusted to being pregnant, and you begin to feel better physically. Usually, morning sickness has lessened or quit completely, you may have more energy, and you may have an increase in appetite. The second trimester is also a time when the fetus is growing rapidly. At the end of the sixth month, the fetus is about 9 inches long and weighs about 1 pounds. You will likely begin to feel the baby move (quickening) between 16 and 20 weeks of pregnancy. Body changes during your second trimester Your body continues to go through many changes during your second trimester. The changes vary from woman to woman.  Your weight will continue to increase. You will notice your lower abdomen bulging out.  You may begin to get stretch marks on your hips, abdomen, and breasts.  You may develop headaches that can be relieved by medicines. The medicines should be approved by your health care provider.  You may urinate more often because the fetus is pressing on your bladder.  You may develop or continue to have heartburn as a result of your pregnancy.  You may develop constipation because certain hormones are causing the muscles that push waste through your intestines to slow down.  You may develop hemorrhoids or swollen, bulging veins (varicose veins).  You may have back pain. This is caused by: ? Weight gain. ? Pregnancy hormones that are relaxing the joints in your pelvis. ? A shift in weight and the muscles that support your balance.  Your breasts will continue to grow and they will continue to become tender.  Your gums may bleed  and may be sensitive to brushing and flossing.  Dark spots or blotches (chloasma, mask of pregnancy) may develop on your face. This will likely fade after the baby is born.  A dark line from your belly button to the pubic area (linea nigra) may appear. This will likely fade after the baby is born.  You may have changes in your hair. These can include thickening of your hair, rapid growth, and changes in texture. Some women also have hair loss during or after pregnancy, or hair that feels dry or thin. Your hair will most likely return to normal after your baby is born.  What to expect at prenatal visits During a routine prenatal visit:  You will be weighed to make sure you and the fetus are growing normally.  Your blood pressure will be taken.  Your abdomen will be measured to track your baby's growth.  The fetal heartbeat will be listened to.  Any test results from the previous visit will be discussed.  Your health care provider may ask you:  How you are feeling.  If you are feeling the baby move.  If you have had any abnormal symptoms, such as leaking fluid, bleeding, severe headaches, or abdominal cramping.  If you are using any tobacco products, including cigarettes, chewing tobacco, and electronic cigarettes.  If you have any questions.  Other tests that may be performed during your second trimester include:  Blood tests that check for: ? Low iron levels (anemia). ? High  blood sugar that affects pregnant women (gestational diabetes) between 36 and 28 weeks. ? Rh antibodies. This is to check for a protein on red blood cells (Rh factor).  Urine tests to check for infections, diabetes, or protein in the urine.  An ultrasound to confirm the proper growth and development of the baby.  An amniocentesis to check for possible genetic problems.  Fetal screens for spina bifida and Down syndrome.  HIV (human immunodeficiency virus) testing. Routine prenatal testing includes  screening for HIV, unless you choose not to have this test.  Follow these instructions at home: Medicines  Follow your health care provider's instructions regarding medicine use. Specific medicines may be either safe or unsafe to take during pregnancy.  Take a prenatal vitamin that contains at least 600 micrograms (mcg) of folic acid.  If you develop constipation, try taking a stool softener if your health care provider approves. Eating and drinking  Eat a balanced diet that includes fresh fruits and vegetables, whole grains, good sources of protein such as meat, eggs, or tofu, and low-fat dairy. Your health care provider will help you determine the amount of weight gain that is right for you.  Avoid raw meat and uncooked cheese. These carry germs that can cause birth defects in the baby.  If you have low calcium intake from food, talk to your health care provider about whether you should take a daily calcium supplement.  Limit foods that are high in fat and processed sugars, such as fried and sweet foods.  To prevent constipation: ? Drink enough fluid to keep your urine clear or pale yellow. ? Eat foods that are high in fiber, such as fresh fruits and vegetables, whole grains, and beans. Activity  Exercise only as directed by your health care provider. Most women can continue their usual exercise routine during pregnancy. Try to exercise for 30 minutes at least 5 days a week. Stop exercising if you experience uterine contractions.  Avoid heavy lifting, wear low heel shoes, and practice good posture.  A sexual relationship may be continued unless your health care provider directs you otherwise. Relieving pain and discomfort  Wear a good support bra to prevent discomfort from breast tenderness.  Take warm sitz baths to soothe any pain or discomfort caused by hemorrhoids. Use hemorrhoid cream if your health care provider approves.  Rest with your legs elevated if you have leg cramps  or low back pain.  If you develop varicose veins, wear support hose. Elevate your feet for 15 minutes, 3-4 times a day. Limit salt in your diet. Prenatal Care  Write down your questions. Take them to your prenatal visits.  Keep all your prenatal visits as told by your health care provider. This is important. Safety  Wear your seat belt at all times when driving.  Make a list of emergency phone numbers, including numbers for family, friends, the hospital, and police and fire departments. General instructions  Ask your health care provider for a referral to a local prenatal education class. Begin classes no later than the beginning of month 6 of your pregnancy.  Ask for help if you have counseling or nutritional needs during pregnancy. Your health care provider can offer advice or refer you to specialists for help with various needs.  Do not use hot tubs, steam rooms, or saunas.  Do not douche or use tampons or scented sanitary pads.  Do not cross your legs for long periods of time.  Avoid cat litter boxes and soil  used by cats. These carry germs that can cause birth defects in the baby and possibly loss of the fetus by miscarriage or stillbirth.  Avoid all smoking, herbs, alcohol, and unprescribed drugs. Chemicals in these products can affect the formation and growth of the baby.  Do not use any products that contain nicotine or tobacco, such as cigarettes and e-cigarettes. If you need help quitting, ask your health care provider.  Visit your dentist if you have not gone yet during your pregnancy. Use a soft toothbrush to brush your teeth and be gentle when you floss. Contact a health care provider if:  You have dizziness.  You have mild pelvic cramps, pelvic pressure, or nagging pain in the abdominal area.  You have persistent nausea, vomiting, or diarrhea.  You have a bad smelling vaginal discharge.  You have pain when you urinate. Get help right away if:  You have a  fever.  You are leaking fluid from your vagina.  You have spotting or bleeding from your vagina.  You have severe abdominal cramping or pain.  You have rapid weight gain or weight loss.  You have shortness of breath with chest pain.  You notice sudden or extreme swelling of your face, hands, ankles, feet, or legs.  You have not felt your baby move in over an hour.  You have severe headaches that do not go away when you take medicine.  You have vision changes. Summary  The second trimester is from week 14 through week 27 (months 4 through 6). It is also a time when the fetus is growing rapidly.  Your body goes through many changes during pregnancy. The changes vary from woman to woman.  Avoid all smoking, herbs, alcohol, and unprescribed drugs. These chemicals affect the formation and growth your baby.  Do not use any tobacco products, such as cigarettes, chewing tobacco, and e-cigarettes. If you need help quitting, ask your health care provider.  Contact your health care provider if you have any questions. Keep all prenatal visits as told by your health care provider. This is important. This information is not intended to replace advice given to you by your health care provider. Make sure you discuss any questions you have with your health care provider. Document Released: 02/21/2001 Document Revised: 08/05/2015 Document Reviewed: 04/30/2012 Elsevier Interactive Patient Education  2017 [employer].

## 2017-07-26 NOTE — Progress Notes (Signed)
Work-in LOW-RISK PREGNANCY VISIT Patient name: Maria Cooper MRN [id number]  Date of birth: [birth year] Chief Complaint:   low risk pregnancy (burning/ pain bladder area)  History of Present Illness:   Maria Cooper is a 22 y.o. G1P0000 female at 10w2dwith an Estimated Date of Delivery: 01/29/18 being seen today for ongoing management of a low-risk pregnancy.  Today she reports burning w/ urination, sharp pains in bladder area x few days- [facility]  .  .  Movement: Absent. denies leaking of fluid. Review of Systems:   Pertinent items are noted in HPI Denies abnormal vaginal discharge w/ itching/odor/irritation, headaches, visual changes, shortness of breath, chest pain, abdominal pain, severe nausea/vomiting, or problems with urination or bowel movements unless otherwise stated above. Pertinent History Reviewed:  Reviewed past medical,surgical, social, obstetrical and family history.  Reviewed problem list, medications and allergies. Physical Assessment:   Vitals:   07/26/17 1123  BP: 100/60  Pulse: 98  Temp: 98.2 F (36.8 C)  Weight: 135 lb 3.2 oz (61.3 kg)  Body mass index is 21.82 kg/m.        Physical Examination:   General appearance: Well appearing, and in no distress  Mental status: Alert, oriented to person, place, and time  Skin: Warm & dry  Cardiovascular: Normal heart rate noted  Respiratory: Normal respiratory effort, no distress  Abdomen: Soft, gravid, nontender  Pelvic: Cervical exam deferred         Extremities: Edema: None  Fetal Status: Fetal Heart Rate (bpm): 150   Movement: Absent    Results for orders placed or performed in visit on 07/26/17 (from the past 24 hour(s))  POCT urinalysis dipstick   Collection Time: 07/26/17 11:28 AM  Result Value Ref Range   Color, UA     Clarity, UA     Glucose, UA neg    Bilirubin, UA     Ketones, UA neg    Spec Grav, UA  1.010 - 1.025   Blood, UA neg    pH, UA  5.0 - 8.0   Protein, UA trace    Urobilinogen, UA  0.2 or 1.0 E.U./dL   Nitrite, UA neg    Leukocytes, UA Negative Negative   Appearance     Odor      Assessment & Plan:  1) Low-risk pregnancy G1P0000 at 111w2dith an Estimated Date of Delivery: 01/29/18   2) Presumed UTI, send urine cx, rx keflex  3) Travel> Discussed she is at a high risk for DVT/PE when pregnancy and traveling increases that risk. Recommended walking around q 1hr, don't cross legs, and dorsiflex feet often to help decrease r/f DVT/PE.    4) Baby[employer]s Optimized pt> doing well checking bp's, no problems   Meds:  Meds ordered this encounter  Medications  . cephALEXin (KEFLEX) 500 MG capsule    Sig: Take 1 capsule (500 mg total) by mouth 4 (four) times daily. X 7 days    Dispense:  28 capsule    Refill:  0    Order Specific Question:   Supervising Provider    Answer:   EUFlorian [id number]]   Labs/procedures today: urine cx  Plan:  Continue routine obstetrical care   Reviewed: Preterm labor symptoms and general obstetric precautions including but not limited to vaginal bleeding, contractions, leaking of fluid and fetal movement were reviewed in detail with the patient.  All questions were answered  Follow-up: Return for cancel  5/23 appt, return 6/18 for LROB and anatomy u/s.  Orders Placed This Encounter  Procedures  . Urine Culture  . US OB Comp + 14 Wk  . POCT urinalysis dipstick   Roma Schanz CNM, [facility] 07/26/2017 12:02 PM

## 2017-07-28 LAB — URINE CULTURE

## 2017-08-02 ENCOUNTER — Encounter: Payer: No Typology Code available for payment source | Admitting: Obstetrics & Gynecology

## 2017-08-17 ENCOUNTER — Encounter: Payer: Self-pay | Admitting: Family Medicine

## 2017-08-29 ENCOUNTER — Encounter: Payer: Self-pay | Admitting: Obstetrics and Gynecology

## 2017-08-29 ENCOUNTER — Ambulatory Visit (INDEPENDENT_AMBULATORY_CARE_PROVIDER_SITE_OTHER): Payer: No Typology Code available for payment source | Admitting: Obstetrics and Gynecology

## 2017-08-29 ENCOUNTER — Ambulatory Visit (INDEPENDENT_AMBULATORY_CARE_PROVIDER_SITE_OTHER): Payer: No Typology Code available for payment source

## 2017-08-29 ENCOUNTER — Other Ambulatory Visit: Payer: Self-pay

## 2017-08-29 VITALS — BP 105/54 | HR 91 | Wt 135.0 lb

## 2017-08-29 DIAGNOSIS — Z3A18 18 weeks gestation of pregnancy: Secondary | ICD-10-CM

## 2017-08-29 DIAGNOSIS — Z3402 Encounter for supervision of normal first pregnancy, second trimester: Secondary | ICD-10-CM

## 2017-08-29 DIAGNOSIS — Z1389 Encounter for screening for other disorder: Secondary | ICD-10-CM

## 2017-08-29 DIAGNOSIS — Z363 Encounter for antenatal screening for malformations: Secondary | ICD-10-CM

## 2017-08-29 DIAGNOSIS — Z3401 Encounter for supervision of normal first pregnancy, first trimester: Secondary | ICD-10-CM

## 2017-08-29 DIAGNOSIS — Z331 Pregnant state, incidental: Secondary | ICD-10-CM

## 2017-08-29 LAB — POCT URINALYSIS DIPSTICK
Blood, UA: NEGATIVE
GLUCOSE UA: NEGATIVE
Ketones, UA: NEGATIVE
Leukocytes, UA: NEGATIVE
Nitrite, UA: NEGATIVE
Protein, UA: NEGATIVE

## 2017-08-29 NOTE — Patient Instructions (Signed)
[phone] is the phone number for Pregnancy Classes or hospital tours at [place] will be referred to  [email or web address] for more information on childbirth classes  At this site you may register for classes. You may sign up for a waiting list if classes are full. Please SIGN UP FOR THIS!.   When the waiting list becomes long, sometimes new classes can be added.

## 2017-08-29 NOTE — Progress Notes (Addendum)
Patient ID: Maria Cooper, female   DOB: [birth year], 21 y.o.   MRN: [id number]    LOW-RISK PREGNANCY VISIT Patient name: Maria Cooper MRN [id number]  Date of birth: 1995/08/01 Chief Complaint:   Routine Prenatal Visit (u/s today) anatomy scan today reviewed normal anatomic survey. History of Present Illness:   Maria Cooper is a 22 y.o. G1P0000 female at 88w1dwith an Estimated Date of Delivery: 01/29/18 being seen today for ongoing management of a low-risk pregnancy.  Today she reports no complaints.She came in with the father of the baby and reports that she her insurance company said she needs a physical.    . Vag. Bleeding: None.  Movement: Present. denies leaking of fluid. Review of Systems:   Pertinent items are noted in HPI Denies abnormal vaginal discharge w/ itching/odor/irritation, headaches, visual changes, shortness of breath, chest pain, abdominal pain, severe nausea/vomiting, or problems with urination or bowel movements unless otherwise stated above. Pertinent History Reviewed:  Reviewed past medical,surgical, social, obstetrical and family history.  Reviewed problem list, medications and allergies. Physical Assessment:   Vitals:   08/29/17 1517  BP: (!) 105/54  Pulse: 91  Weight: 135 lb (61.2 kg)  Body mass index is 21.79 kg/m.        Physical Examination:   General appearance: Well appearing, and in no distress  Mental status: Alert, oriented to person, place, and time  Skin: Warm & dry  Cardiovascular: Normal heart rate noted  Respiratory: Normal respiratory effort, no distress  Abdomen: Soft, gravid, nontender  Pelvic: Cervical exam deferred         Extremities: Edema: None  Fetal Status: Fetal Heart Rate (bpm): 144 u/s Fundal Height: 15 cm Movement: Present    Results for orders placed or performed in visit on 08/29/17 (from the past 24 hour(s))  POCT urinalysis dipstick   Collection Time: 08/29/17  4:18 PM  Result Value Ref Range   Color, UA      Clarity, UA     Glucose, UA Negative Negative   Bilirubin, UA     Ketones, UA neg    Spec Grav, UA  1.010 - 1.025   Blood, UA neg    pH, UA  5.0 - 8.0   Protein, UA Negative Negative   Urobilinogen, UA  0.2 or 1.0 E.U./dL   Nitrite, UA neg    Leukocytes, UA Negative Negative   Appearance     Odor      Assessment & Plan:  1) Low-risk pregnancy G1P0000 at 148w1dith an Estimated Date of Delivery: 01/29/18    Meds: No orders of the defined types were placed in this encounter.  Labs/procedures today: [place]8+1 wks,breech,anterior low lying placenta,tip of placenta to cervix is 2 cm,cervical length 4.2 cm,normal ovaries bilat,fhr 144 bpm,SVP of fluid 5 cm,EFW 254 g 78%,anatomy complete,no obvious abnormalities  Plan:  Continue routine obstetrical care F/U in 8 weeks  Follow-up: Return in about 8 weeks (around 10/24/2017) [place] Orders Placed This Encounter  Procedures  . POCT urinalysis dipstick   By signing my name below, I, EmDe Burrsattest that this documentation has been prepared under the direction and in the presence of FeJonnie KindMD. Electronically Signed: EmDe BurrsMedical Scribe. 08/29/17. 4:45 PM.  I personally performed the services described in this documentation, which was SCRIBED in my presence. The recorded information has been reviewed and considered accurate. It has been edited as necessary during review. JoJonnie KindMD

## 2017-08-29 NOTE — Progress Notes (Signed)
[place] 18+1 wks,breech,anterior low lying placenta,tip of placenta to cervix is 2 cm,cervical length 4.2 cm,normal ovaries bilat,fhr 144 bpm,SVP of fluid 5 cm,EFW 254 g 78%,anatomy complete,no obvious abnormalities

## 2017-09-03 ENCOUNTER — Encounter: Payer: Self-pay | Admitting: Women's Health

## 2017-09-03 DIAGNOSIS — O4440 Low lying placenta NOS or without hemorrhage, unspecified trimester: Secondary | ICD-10-CM | POA: Insufficient documentation

## 2017-09-06 ENCOUNTER — Telehealth: Payer: Self-pay | Admitting: Obstetrics and Gynecology

## 2017-09-06 NOTE — Telephone Encounter (Signed)
Patient called stating that she seen Dr. Glo Herring on 08/29/2017 and he was suppose to write on the notes that she had a physical done for her job. Pt states that she checked her notes but there is not mention of a physical done and she needs that for work. I let patient know that Dr. Glo Herring is not in the office and she will not receive a phone call until tomorrow. Patient states that will be okay.

## 2017-09-07 NOTE — Telephone Encounter (Signed)
Called patient back and left message that I am returning her call.

## 2017-09-17 ENCOUNTER — Encounter: Payer: Self-pay | Admitting: Women's Health

## 2017-09-28 ENCOUNTER — Telehealth: Payer: Self-pay | Admitting: Advanced Practice Midwife

## 2017-09-28 NOTE — Telephone Encounter (Signed)
Patient called stating that she is pregnant and passed out at work, pt states that she did not want to go to [employer] last night. Pt states that she wanted to be seen today here. Pt states that she does not if she was dehydrated. Please contact pt

## 2017-09-28 NOTE — Telephone Encounter (Signed)
Patent states she "blacked out" at work and thinks she was dehydrated.  Made an appt on Monday for f/u.  Encouraged to push extra fluids.  Verbalized understanding.

## 2017-10-01 ENCOUNTER — Encounter: Payer: Self-pay | Admitting: Women's Health

## 2017-10-01 ENCOUNTER — Ambulatory Visit (INDEPENDENT_AMBULATORY_CARE_PROVIDER_SITE_OTHER): Payer: No Typology Code available for payment source | Admitting: Women's Health

## 2017-10-01 VITALS — BP 108/64 | HR 97 | Wt 143.2 lb

## 2017-10-01 DIAGNOSIS — D649 Anemia, unspecified: Secondary | ICD-10-CM | POA: Diagnosis not present

## 2017-10-01 DIAGNOSIS — O99012 Anemia complicating pregnancy, second trimester: Secondary | ICD-10-CM

## 2017-10-01 DIAGNOSIS — Z331 Pregnant state, incidental: Secondary | ICD-10-CM

## 2017-10-01 DIAGNOSIS — Z3A22 22 weeks gestation of pregnancy: Secondary | ICD-10-CM

## 2017-10-01 DIAGNOSIS — Z1389 Encounter for screening for other disorder: Secondary | ICD-10-CM

## 2017-10-01 DIAGNOSIS — R55 Syncope and collapse: Secondary | ICD-10-CM | POA: Diagnosis not present

## 2017-10-01 DIAGNOSIS — Z3402 Encounter for supervision of normal first pregnancy, second trimester: Secondary | ICD-10-CM

## 2017-10-01 LAB — POCT HEMOGLOBIN: HEMOGLOBIN: 10.2 g/dL — AB (ref 12.2–16.2)

## 2017-10-01 NOTE — Progress Notes (Signed)
   Work-in LOW-RISK PREGNANCY VISIT Patient name: Maria Cooper MRN [id number]  Date of birth: August 17, 1995 Chief Complaint:   work-in-ob (passed out at work-Thurday7-18-19/ increase heart rate)  History of Present Illness:   Maria Cooper is a 22 y.o. G1P0000 female at 22w6dwith an Estimated Date of Delivery: 01/29/18 being seen today for ongoing management of a low-risk pregnancy.  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 Feels she was dehydrated. Ate chicken few hours earlier. Felt sharp pain Rt lower side as baby shifted all the way to that side, then got hot/sweaty/dizzy and 'blacked out for a few seconds'. VS were normal when taken 133m later. She was able to work her whole shift, declined going to ED for eval. HR has been a little higher recently, but states she runs a higher hr. Denies palpitation/flutters/sob/cp, etc. .  .  .  Movement: Present. denies leaking of fluid. Review of Systems:   Pertinent items are noted in HPI Denies abnormal vaginal discharge w/ itching/odor/irritation, headaches, visual changes, shortness of breath, chest pain, abdominal pain, severe nausea/vomiting, or problems with urination or bowel movements unless otherwise stated above. Pertinent History Reviewed:  Reviewed past medical,surgical, social, obstetrical and family history.  Reviewed problem list, medications and allergies. Physical Assessment:   Vitals:   10/01/17 1000 10/01/17 1044  BP: 108/64   Pulse: (!) 120 97  Weight: 143 lb 3.2 oz (65 kg)   Body mass index is 23.11 kg/m. Pulse Ox 99% RA       Physical Examination:   General appearance: Well appearing, and in no distress  Mental status: Alert, oriented to person, place, and time  Skin: Warm & dry  Cardiovascular: Normal heart rate noted, HRRR  Respiratory: Normal respiratory effort, no distress, LCTAB  Abdomen: Soft, gravid, nontender  Pelvic: Cervical exam deferred          Extremities: Edema: None  Fetal Status: Fetal Heart Rate (bpm): 148 Fundal Height: 22 cm Movement: Present     Fingerstick Hgb: 10.2  Results for orders placed or performed in visit on 10/01/17 (from the past 24 hour(s))  POCT hemoglobin   Collection Time: 10/01/17 11:22 AM  Result Value Ref Range   Hemoglobin 10.2 (A) 12.2 - 16.2 g/dL    Assessment & Plan:  1) Low-risk pregnancy G1P0000 at 2283w6dth an Estimated Date of Delivery: 01/29/18   2) F/u after brief syncopal episode 7/18, none since, slightly anemic, feels she was dehydrated, also could have been baby moving to Rt side/pressing on vessel. Advised staying well hydrated, eating protein q few hours. Let us [place] if has any other syncopal episodes or develops any new sx.   3) Mild anemia> Fe BID, increase fe-rich foods   Meds: No orders of the defined types were placed in this encounter.  Labs/procedures today: hgb  Plan:  Continue routine obstetrical care   Reviewed: Preterm labor symptoms and general obstetric precautions including but not limited to vaginal bleeding, contractions, leaking of fluid and fetal movement were reviewed in detail with the patient.  All questions were answered  Follow-up: Return for add PN2 to 8/14 visit-make appt for am please.  Orders Placed This Encounter  Procedures  . POC Urinalysis Dipstick OB  . POCT hemoglobin   KimRoma SchanzM, WHNPacific [facility]22/2019 11:22 AM

## 2017-10-01 NOTE — Patient Instructions (Addendum)
Maria Cooper, I greatly value your feedback.  If you receive a survey following your visit with [place] today, we appreciate you taking the time to fill it out.  Thanks, Maria Cooper, CNM, WHNP-BC  For Dizzy Spells:   This is usually related to either your blood sugar or your blood pressure dropping  Make sure you are staying well hydrated and drinking enough water so that your urine is clear  Eat small frequent meals and snacks containing protein (meat, eggs, nuts, cheese) so that your blood sugar doesn't drop  If you do get dizzy, sit/lay down and get you something to drink and a snack containing protein- you will usually start feeling better in 10-20 minutes   Iron twice daily   Second Trimester of Pregnancy The second trimester is from week 14 through week 27 (months 4 through 6). The second trimester is often a time when you feel your best. Your body has adjusted to being pregnant, and you begin to feel better physically. Usually, morning sickness has lessened or quit completely, you may have more energy, and you may have an increase in appetite. The second trimester is also a time when the fetus is growing rapidly. At the end of the sixth month, the fetus is about 9 inches long and weighs about 1 pounds. You will likely begin to feel the baby move (quickening) between 16 and 20 weeks of pregnancy. Body changes during your second trimester Your body continues to go through many changes during your second trimester. The changes vary from woman to woman.  Your weight will continue to increase. You will notice your lower abdomen bulging out.  You may begin to get stretch marks on your hips, abdomen, and breasts.  You may develop headaches that can be relieved by medicines. The medicines should be approved by your health care provider.  You may urinate more often because the fetus is pressing on your bladder.  You may develop or continue to have heartburn as a result of your  pregnancy.  You may develop constipation because certain hormones are causing the muscles that push waste through your intestines to slow down.  You may develop hemorrhoids or swollen, bulging veins (varicose veins).  You may have back pain. This is caused by: ? Weight gain. ? Pregnancy hormones that are relaxing the joints in your pelvis. ? A shift in weight and the muscles that support your balance.  Your breasts will continue to grow and they will continue to become tender.  Your gums may bleed and may be sensitive to brushing and flossing.  Dark spots or blotches (chloasma, mask of pregnancy) may develop on your face. This will likely fade after the baby is born.  A dark line from your belly button to the pubic area (linea nigra) may appear. This will likely fade after the baby is born.  You may have changes in your hair. These can include thickening of your hair, rapid growth, and changes in texture. Some women also have hair loss during or after pregnancy, or hair that feels dry or thin. Your hair will most likely return to normal after your baby is born.  What to expect at prenatal visits During a routine prenatal visit:  You will be weighed to make sure you and the fetus are growing normally.  Your blood pressure will be taken.  Your abdomen will be measured to track your baby's growth.  The fetal heartbeat will be listened to.  Any test results from  the previous visit will be discussed.  Your health care provider may ask you:  How you are feeling.  If you are feeling the baby move.  If you have had any abnormal symptoms, such as leaking fluid, bleeding, severe headaches, or abdominal cramping.  If you are using any tobacco products, including cigarettes, chewing tobacco, and electronic cigarettes.  If you have any questions.  Other tests that may be performed during your second trimester include:  Blood tests that check for: ? Low iron levels  (anemia). ? High blood sugar that affects pregnant women (gestational diabetes) between 47 and 28 weeks. ? Rh antibodies. This is to check for a protein on red blood cells (Rh factor).  Urine tests to check for infections, diabetes, or protein in the urine.  An ultrasound to confirm the proper growth and development of the baby.  An amniocentesis to check for possible genetic problems.  Fetal screens for spina bifida and Down syndrome.  HIV (human immunodeficiency virus) testing. Routine prenatal testing includes screening for HIV, unless you choose not to have this test.  Follow these instructions at home: Medicines  Follow your health care provider's instructions regarding medicine use. Specific medicines may be either safe or unsafe to take during pregnancy.  Take a prenatal vitamin that contains at least 600 micrograms (mcg) of folic acid.  If you develop constipation, try taking a stool softener if your health care provider approves. Eating and drinking  Eat a balanced diet that includes fresh fruits and vegetables, whole grains, good sources of protein such as meat, eggs, or tofu, and low-fat dairy. Your health care provider will help you determine the amount of weight gain that is right for you.  Avoid raw meat and uncooked cheese. These carry germs that can cause birth defects in the baby.  If you have low calcium intake from food, talk to your health care provider about whether you should take a daily calcium supplement.  Limit foods that are high in fat and processed sugars, such as fried and sweet foods.  To prevent constipation: ? Drink enough fluid to keep your urine clear or pale yellow. ? Eat foods that are high in fiber, such as fresh fruits and vegetables, whole grains, and beans. Activity  Exercise only as directed by your health care provider. Most women can continue their usual exercise routine during pregnancy. Try to exercise for 30 minutes at least 5 days a  week. Stop exercising if you experience uterine contractions.  Avoid heavy lifting, wear low heel shoes, and practice good posture.  A sexual relationship may be continued unless your health care provider directs you otherwise. Relieving pain and discomfort  Wear a good support bra to prevent discomfort from breast tenderness.  Take warm sitz baths to soothe any pain or discomfort caused by hemorrhoids. Use hemorrhoid cream if your health care provider approves.  Rest with your legs elevated if you have leg cramps or low back pain.  If you develop varicose veins, wear support hose. Elevate your feet for 15 minutes, 3-4 times a day. Limit salt in your diet. Prenatal Care  Write down your questions. Take them to your prenatal visits.  Keep all your prenatal visits as told by your health care provider. This is important. Safety  Wear your seat belt at all times when driving.  Make a list of emergency phone numbers, including numbers for family, friends, the hospital, and police and fire departments. General instructions  Ask your health care provider  for a referral to a local prenatal education class. Begin classes no later than the beginning of month 6 of your pregnancy.  Ask for help if you have counseling or nutritional needs during pregnancy. Your health care provider can offer advice or refer you to specialists for help with various needs.  Do not use hot tubs, steam rooms, or saunas.  Do not douche or use tampons or scented sanitary pads.  Do not cross your legs for long periods of time.  Avoid cat litter boxes and soil used by cats. These carry germs that can cause birth defects in the baby and possibly loss of the fetus by miscarriage or stillbirth.  Avoid all smoking, herbs, alcohol, and unprescribed drugs. Chemicals in these products can affect the formation and growth of the baby.  Do not use any products that contain nicotine or tobacco, such as cigarettes and  e-cigarettes. If you need help quitting, ask your health care provider.  Visit your dentist if you have not gone yet during your pregnancy. Use a soft toothbrush to brush your teeth and be gentle when you floss. Contact a health care provider if:  You have dizziness.  You have mild pelvic cramps, pelvic pressure, or nagging pain in the abdominal area.  You have persistent nausea, vomiting, or diarrhea.  You have a bad smelling vaginal discharge.  You have pain when you urinate. Get help right away if:  You have a fever.  You are leaking fluid from your vagina.  You have spotting or bleeding from your vagina.  You have severe abdominal cramping or pain.  You have rapid weight gain or weight loss.  You have shortness of breath with chest pain.  You notice sudden or extreme swelling of your face, hands, ankles, feet, or legs.  You have not felt your baby move in over an hour.  You have severe headaches that do not go away when you take medicine.  You have vision changes. Summary  The second trimester is from week 14 through week 27 (months 4 through 6). It is also a time when the fetus is growing rapidly.  Your body goes through many changes during pregnancy. The changes vary from woman to woman.  Avoid all smoking, herbs, alcohol, and unprescribed drugs. These chemicals affect the formation and growth your baby.  Do not use any tobacco products, such as cigarettes, chewing tobacco, and e-cigarettes. If you need help quitting, ask your health care provider.  Contact your health care provider if you have any questions. Keep all prenatal visits as told by your health care provider. This is important. This information is not intended to replace advice given to you by your health care provider. Make sure you discuss any questions you have with your health care provider. Document Released: 02/21/2001 Document Revised: 08/05/2015 Document Reviewed: 04/30/2012 Elsevier  Interactive Patient Education  2017 Elsevier Inc.   Iron-Rich Diet Iron is a mineral that helps your body to produce hemoglobin. Hemoglobin is a protein in your red blood cells that carries oxygen to your body's tissues. Eating too little iron may cause you to feel weak and tired, and it can increase your risk for infection. Eating enough iron is necessary for your body's metabolism, muscle function, and nervous system. Iron is naturally found in many foods. It can also be added to foods or fortified in foods. There are two types of dietary iron:  Heme iron. Heme iron is absorbed by the body more easily than nonheme iron.  Heme iron is found in meat, poultry, and fish.  Nonheme iron. Nonheme iron is found in dietary supplements, iron-fortified grains, beans, and vegetables.  You may need to follow an iron-rich diet if:  You have been diagnosed with iron deficiency or iron-deficiency anemia.  You have a condition that prevents you from absorbing dietary iron, such as: ? Infection in your intestines. ? Celiac disease. This involves long-lasting (chronic) inflammation of your intestines.  You do not eat enough iron.  You eat a diet that is high in foods that impair iron absorption.  You have lost a lot of blood.  You have heavy bleeding during your menstrual cycle.  You are pregnant.  What is my plan? Your health care provider may help you to determine how much iron you need per day based on your condition. Generally, when a person consumes sufficient amounts of iron in the diet, the following iron needs are met:  Men. ? 38-16 years old: 11 mg per day. ? 21-59 years old: 8 mg per day.  Women. ? 59-63 years old: 15 mg per day. ? 75-7 years old: 18 mg per day. ? Over 1 years old: 8 mg per day. ? Pregnant women: 27 mg per day. ? Breastfeeding women: 9 mg per day.  What do I need to know about an iron-rich diet?  Eat fresh fruits and vegetables that are high in vitamin C along  with foods that are high in iron. This will help increase the amount of iron that your body absorbs from food, especially with foods containing nonheme iron. Foods that are high in vitamin C include oranges, peppers, tomatoes, and mango.  Take iron supplements only as directed by your health care provider. Overdose of iron can be life-threatening. If you were prescribed iron supplements, take them with orange juice or a vitamin C supplement.  Cook foods in pots and pans that are made from iron.  Eat nonheme iron-containing foods alongside foods that are high in heme iron. This helps to improve your iron absorption.  Certain foods and drinks contain compounds that impair iron absorption. Avoid eating these foods in the same meal as iron-rich foods or with iron supplements. These include: ? Coffee, black tea, and red wine. ? Milk, dairy products, and foods that are high in calcium. ? Beans, soybeans, and peas. ? Whole grains.  When eating foods that contain both nonheme iron and compounds that impair iron absorption, follow these tips to absorb iron better. ? Soak beans overnight before cooking. ? Soak whole grains overnight and drain them before using. ? Ferment flours before baking, such as using yeast in bread dough. What foods can I eat? Grains Iron-fortified breakfast cereal. Iron-fortified whole-wheat bread. Enriched rice. Sprouted grains. Vegetables Spinach. Potatoes with skin. Green peas. Broccoli. Red and green bell peppers. Fermented vegetables. Fruits Prunes. Raisins. Oranges. Strawberries. Mango. Grapefruit. Meats and Other Protein Sources Beef liver. Oysters. Beef. Shrimp. [place]. Chicken. [place]. Sardines. Chickpeas. Nuts. Tofu. Beverages Tomato juice. Fresh orange juice. Prune juice. Hibiscus tea. Fortified instant breakfast shakes. Condiments Tahini. Fermented soy sauce. Sweets and Desserts Black-strap molasses. Other Wheat germ. The items listed above may not be a  complete list of recommended foods or beverages. Contact your dietitian for more options. What foods are not recommended? Grains Whole grains. Bran cereal. Bran flour. Oats. Vegetables Artichokes. Brussels sprouts. Kale. Fruits Blueberries. Raspberries. Strawberries. Figs. Meats and Other Protein Sources Soybeans. Products made from soy protein. Dairy Milk. Cream. Cheese. Yogurt. Cottage cheese. Beverages  Coffee. Black tea. Red wine. Sweets and Desserts Cocoa. Chocolate. Ice cream. Other Basil. Oregano. Parsley. The items listed above may not be a complete list of foods and beverages to avoid. Contact your dietitian for more information. This information is not intended to replace advice given to you by your health care provider. Make sure you discuss any questions you have with your health care provider. Document Released: 10/11/2004 Document Revised: 09/17/2015 Document Reviewed: 09/24/2013 Elsevier Interactive Patient Education  [employer].

## 2017-10-05 ENCOUNTER — Other Ambulatory Visit: Payer: Self-pay

## 2017-10-05 ENCOUNTER — Encounter: Payer: Self-pay | Admitting: Family Medicine

## 2017-10-05 ENCOUNTER — Ambulatory Visit (INDEPENDENT_AMBULATORY_CARE_PROVIDER_SITE_OTHER): Payer: No Typology Code available for payment source | Admitting: Family Medicine

## 2017-10-05 VITALS — BP 104/60 | HR 79 | Temp 98.2°F | Resp 14 | Ht 66.0 in | Wt 144.1 lb

## 2017-10-05 DIAGNOSIS — Z0000 Encounter for general adult medical examination without abnormal findings: Secondary | ICD-10-CM | POA: Diagnosis not present

## 2017-10-05 NOTE — Patient Instructions (Addendum)
Colace 100 mg, 1-3 times a day Dentist every six months Vision exam yearly Immunization records, send to office.
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
## 2017-10-24 ENCOUNTER — Ambulatory Visit (INDEPENDENT_AMBULATORY_CARE_PROVIDER_SITE_OTHER): Payer: No Typology Code available for payment source | Admitting: Advanced Practice Midwife

## 2017-10-24 ENCOUNTER — Encounter: Payer: No Typology Code available for payment source | Admitting: Advanced Practice Midwife

## 2017-10-24 ENCOUNTER — Other Ambulatory Visit: Payer: No Typology Code available for payment source

## 2017-10-24 VITALS — BP 87/57 | HR 70 | Wt 150.5 lb

## 2017-10-24 DIAGNOSIS — Z3402 Encounter for supervision of normal first pregnancy, second trimester: Secondary | ICD-10-CM

## 2017-10-24 DIAGNOSIS — O4442 Low lying placenta NOS or without hemorrhage, second trimester: Secondary | ICD-10-CM

## 2017-10-24 DIAGNOSIS — R1011 Right upper quadrant pain: Secondary | ICD-10-CM

## 2017-10-24 DIAGNOSIS — Z3A26 26 weeks gestation of pregnancy: Secondary | ICD-10-CM

## 2017-10-24 DIAGNOSIS — O26892 Other specified pregnancy related conditions, second trimester: Secondary | ICD-10-CM

## 2017-10-24 DIAGNOSIS — O4440 Low lying placenta NOS or without hemorrhage, unspecified trimester: Secondary | ICD-10-CM

## 2017-10-24 DIAGNOSIS — Z131 Encounter for screening for diabetes mellitus: Secondary | ICD-10-CM

## 2017-10-24 DIAGNOSIS — Z1389 Encounter for screening for other disorder: Secondary | ICD-10-CM

## 2017-10-24 DIAGNOSIS — Z331 Pregnant state, incidental: Secondary | ICD-10-CM

## 2017-10-24 LAB — POCT URINALYSIS DIPSTICK OB
Glucose, UA: NEGATIVE — AB
Ketones, UA: NEGATIVE
Leukocytes, UA: NEGATIVE
NITRITE UA: NEGATIVE
PROTEIN: NEGATIVE
RBC UA: NEGATIVE

## 2017-10-24 NOTE — Progress Notes (Signed)
  G1P0000 28w1dEstimated Date of Delivery: 01/29/18  Blood pressure (!) 87/57, pulse 70, weight 150 lb 8 oz (68.3 kg), last menstrual period 04/24/2017.   BP weight and urine results all reviewed and noted.  Please refer to the obstetrical flow sheet for the fundal height and fetal heart rate documentation:  Patient reports good fetal movement, denies any bleeding and no rupture of membranes symptoms or regular contractions. Patient is without complaints. All questions were answered. BPs in babyscripts normal   Physical Assessment:   Vitals:   10/24/17 0909  BP: (!) 87/57  Pulse: 70  Weight: 150 lb 8 oz (68.3 kg)  Body mass index is 24.29 kg/m.        Physical Examination:   General appearance: Well appearing, and in no distress  Mental status: Alert, oriented to person, place, and time  Skin: Warm & dry  Cardiovascular: Normal heart rate noted  Respiratory: Normal respiratory effort, no distress  Abdomen: Soft, gravid, nontender  Pelvic: Cervical exam deferred         Extremities: Edema: Trace  Fetal Status: Fetal Heart Rate (bpm): 135 Fundal Height: 26 cm Movement: Present    No results found for this or any previous visit (from the past 24 hour(s)).   Orders Placed This Encounter  Procedures  . [place] Follow Up  . Comprehensive metabolic panel  . POC Urinalysis Dipstick OB   PN2 today   Plan:  Continued routine obstetrical care, recheck placenta (low lying at 18 weeks, 2cm from os)  Return in about 6 weeks (around 12/05/2017) for LROB, US:OB F/U:recheck placenta.

## 2017-10-24 NOTE — Patient Instructions (Signed)
Maria Cooper, I greatly value your feedback.  If you receive a survey following your visit with [place] today, we appreciate you taking the time to fill it out.  Thanks, Nigel Berthold, CNM   Call the office [phone]) or go to [facility] if:  You begin to have strong, frequent contractions  Your water breaks.  Sometimes it is a big gush of fluid, sometimes it is just a trickle that keeps getting your panties wet or running down your legs  You have vaginal bleeding.  It is normal to have a small amount of spotting if your cervix was checked.   You don't feel your baby moving like normal.  If you don't, get you something to eat and drink and lay down and focus on feeling your baby move.  You should feel at least 10 movements in 2 hours.  If you don't, you should call the office or go to [facility].    Tdap Vaccine  It is recommended that you get the Tdap vaccine during the third trimester of EACH pregnancy to help protect your baby from getting pertussis (whooping cough)  27-36 weeks is the BEST time to do this so that you can pass the protection on to your baby. During pregnancy is better than after pregnancy, but if you are unable to get it during pregnancy it will be offered at the hospital.   You will be offered this vaccine in the office after 27 weeks. If you do not have health insurance, you can get this vaccine at the health department or your family doctor  Everyone who will be around your baby should also be up-to-date on their vaccines. Adults (who are not pregnant) only need 1 dose of Tdap during adulthood.   Third Trimester of Pregnancy The third trimester is from week 29 through week 42, months 7 through 9. The third trimester is a time when the fetus is growing rapidly. At the end of the ninth month, the fetus is about 20 inches in length and weighs 6-10 pounds.  BODY CHANGES Your body goes through many changes during pregnancy. The changes vary from woman  to woman.   Your weight will continue to increase. You can expect to gain 25-35 pounds (11-16 kg) by the end of the pregnancy.  You may begin to get stretch marks on your hips, abdomen, and breasts.  You may urinate more often because the fetus is moving lower into your pelvis and pressing on your bladder.  You may develop or continue to have heartburn as a result of your pregnancy.  You may develop constipation because certain hormones are causing the muscles that push waste through your intestines to slow down.  You may develop hemorrhoids or swollen, bulging veins (varicose veins).  You may have pelvic pain because of the weight gain and pregnancy hormones relaxing your joints between the bones in your pelvis. Backaches may result from overexertion of the muscles supporting your posture.  You may have changes in your hair. These can include thickening of your hair, rapid growth, and changes in texture. Some women also have hair loss during or after pregnancy, or hair that feels dry or thin. Your hair will most likely return to normal after your baby is born.  Your breasts will continue to grow and be tender. A yellow discharge may leak from your breasts called colostrum.  Your belly button may stick out.  You may feel short of breath because of your expanding uterus.  You may notice the fetus "dropping," or moving lower in your abdomen.  You may have a bloody mucus discharge. This usually occurs a few days to a week before labor begins.  Your cervix becomes thin and soft (effaced) near your due date. WHAT TO EXPECT AT YOUR PRENATAL EXAMS  You will have prenatal exams every 2 weeks until week 36. Then, you will have weekly prenatal exams. During a routine prenatal visit:  You will be weighed to make sure you and the fetus are growing normally.  Your blood pressure is taken.  Your abdomen will be measured to track your baby's growth.  The fetal heartbeat will be listened  to.  Any test results from the previous visit will be discussed.  You may have a cervical check near your due date to see if you have effaced. At around 36 weeks, your caregiver will check your cervix. At the same time, your caregiver will also perform a test on the secretions of the vaginal tissue. This test is to determine if a type of bacteria, Group B streptococcus, is present. Your caregiver will explain this further. Your caregiver may ask you:  What your birth plan is.  How you are feeling.  If you are feeling the baby move.  If you have had any abnormal symptoms, such as leaking fluid, bleeding, severe headaches, or abdominal cramping.  If you have any questions. Other tests or screenings that may be performed during your third trimester include:  Blood tests that check for low iron levels (anemia).  Fetal testing to check the health, activity level, and growth of the fetus. Testing is done if you have certain medical conditions or if there are problems during the pregnancy. FALSE LABOR You may feel small, irregular contractions that eventually go away. These are called Braxton Hicks contractions, or false labor. Contractions may last for hours, days, or even weeks before true labor sets in. If contractions come at regular intervals, intensify, or become painful, it is best to be seen by your caregiver.  SIGNS OF LABOR   Menstrual-like cramps.  Contractions that are 5 minutes apart or less.  Contractions that start on the top of the uterus and spread down to the lower abdomen and back.  A sense of increased pelvic pressure or back pain.  A watery or bloody mucus discharge that comes from the vagina. If you have any of these signs before the 37th week of pregnancy, call your caregiver right away. You need to go to the hospital to get checked immediately. HOME CARE INSTRUCTIONS   Avoid all smoking, herbs, alcohol, and unprescribed drugs. These chemicals affect the  formation and growth of the baby.  Follow your caregiver's instructions regarding medicine use. There are medicines that are either safe or unsafe to take during pregnancy.  Exercise only as directed by your caregiver. Experiencing uterine cramps is a good sign to stop exercising.  Continue to eat regular, healthy meals.  Wear a good support bra for breast tenderness.  Do not use hot tubs, steam rooms, or saunas.  Wear your seat belt at all times when driving.  Avoid raw meat, uncooked cheese, cat litter boxes, and soil used by cats. These carry germs that can cause birth defects in the baby.  Take your prenatal vitamins.  Try taking a stool softener (if your caregiver approves) if you develop constipation. Eat more high-fiber foods, such as fresh vegetables or fruit and whole grains. Drink plenty of fluids to keep your urine  clear or pale yellow.  Take warm sitz baths to soothe any pain or discomfort caused by hemorrhoids. Use hemorrhoid cream if your caregiver approves.  If you develop varicose veins, wear support hose. Elevate your feet for 15 minutes, 3-4 times a day. Limit salt in your diet.  Avoid heavy lifting, wear low heal shoes, and practice good posture.  Rest a lot with your legs elevated if you have leg cramps or low back pain.  Visit your dentist if you have not gone during your pregnancy. Use a soft toothbrush to brush your teeth and be gentle when you floss.  A sexual relationship may be continued unless your caregiver directs you otherwise.  Do not travel far distances unless it is absolutely necessary and only with the approval of your caregiver.  Take prenatal classes to understand, practice, and ask questions about the labor and delivery.  Make a trial run to the hospital.  Pack your hospital bag.  Prepare the baby's nursery.  Continue to go to all your prenatal visits as directed by your caregiver. SEEK MEDICAL CARE IF:  You are unsure if you are in  labor or if your water has broken.  You have dizziness.  You have mild pelvic cramps, pelvic pressure, or nagging pain in your abdominal area.  You have persistent nausea, vomiting, or diarrhea.  You have a bad smelling vaginal discharge.  You have pain with urination. SEEK IMMEDIATE MEDICAL CARE IF:   You have a fever.  You are leaking fluid from your vagina.  You have spotting or bleeding from your vagina.  You have severe abdominal cramping or pain.  You have rapid weight loss or gain.  You have shortness of breath with chest pain.  You notice sudden or extreme swelling of your face, hands, ankles, feet, or legs.  You have not felt your baby move in over an hour.  You have severe headaches that do not go away with medicine.  You have vision changes. Document Released: 02/21/2001 Document Revised: 03/04/2013 Document Reviewed: 04/30/2012 [facility] Patient Information 2015 [place], [place]. This information is not intended to replace advice given to you by your health care provider. Make sure you discuss any questions you have with your health care provider.

## 2017-10-25 LAB — COMPREHENSIVE METABOLIC PANEL
A/G RATIO: 1.7 (ref 1.2–2.2)
ALK PHOS: 96 IU/L (ref 39–117)
ALT: 20 IU/L (ref 0–32)
AST: 20 IU/L (ref 0–40)
Albumin: 4.1 g/dL (ref 3.5–5.5)
BUN/Creatinine Ratio: 10 (ref 9–23)
BUN: 5 mg/dL — AB (ref 6–20)
Bilirubin Total: 0.3 mg/dL (ref 0.0–1.2)
CO2: 19 mmol/L — ABNORMAL LOW (ref 20–29)
Calcium: 9.2 mg/dL (ref 8.7–10.2)
Chloride: 106 mmol/L (ref 96–106)
Creatinine, Ser: 0.52 mg/dL — ABNORMAL LOW (ref 0.57–1.00)
GFR calc non Af Amer: 135 mL/min/{1.73_m2} (ref >59)
GFR, EST AFRICAN AMERICAN: 156 mL/min/{1.73_m2} (ref >59)
GLUCOSE: 100 mg/dL — AB (ref 65–99)
Globulin, Total: 2.4 g/dL (ref 1.5–4.5)
POTASSIUM: 3.3 mmol/L — AB (ref 3.5–5.2)
Sodium: 142 mmol/L (ref 134–144)
TOTAL PROTEIN: 6.5 g/dL (ref 6.0–8.5)

## 2017-10-25 LAB — CBC
HEMATOCRIT: 35 % (ref 34.0–46.6)
HEMOGLOBIN: 11.8 g/dL (ref 11.1–15.9)
MCH: 31.1 pg (ref 26.6–33.0)
MCHC: 33.7 g/dL (ref 31.5–35.7)
MCV: 92 fL (ref 79–97)
Platelets: 167 10*3/uL (ref 150–450)
RBC: 3.79 x10E6/uL (ref 3.77–5.28)
RDW: 13.5 % (ref 12.3–15.4)
WBC: 11.3 10*3/uL — ABNORMAL HIGH (ref 3.4–10.8)

## 2017-10-25 LAB — GLUCOSE TOLERANCE, 2 HOURS W/ 1HR
GLUCOSE, 1 HOUR: 98 mg/dL (ref 65–179)
GLUCOSE, FASTING: 74 mg/dL (ref 65–91)
Glucose, 2 hour: 80 mg/dL (ref 65–152)

## 2017-10-25 LAB — HIV ANTIBODY (ROUTINE TESTING W REFLEX): HIV SCREEN 4TH GENERATION: NONREACTIVE

## 2017-10-25 LAB — RPR: RPR: NONREACTIVE

## 2017-10-25 LAB — ANTIBODY SCREEN: ANTIBODY SCREEN: NEGATIVE

## 2017-11-06 MED FILL — PRENATAL VITAMIN PLUS LOW I: 27-1 | 90 days supply | Qty: 90 | Fill #2

## 2017-11-22 ENCOUNTER — Telehealth: Payer: Self-pay | Admitting: *Deleted

## 2017-11-22 NOTE — Telephone Encounter (Signed)
Patient reports that she had lower abdominal and back pain lasting for about 3 hours. She denies any bleeding or vaginal discharge. She reports good fetal movement. The pain has went away. She is having some back pain on and off. I advised that she take tylenol and wear pregnancy support belt. She is agreeable to this and will continue to monitor her symptoms.

## 2017-12-05 ENCOUNTER — Ambulatory Visit (INDEPENDENT_AMBULATORY_CARE_PROVIDER_SITE_OTHER): Payer: No Typology Code available for payment source | Admitting: Advanced Practice Midwife

## 2017-12-05 ENCOUNTER — Ambulatory Visit (INDEPENDENT_AMBULATORY_CARE_PROVIDER_SITE_OTHER): Payer: No Typology Code available for payment source

## 2017-12-05 ENCOUNTER — Encounter: Payer: Self-pay | Admitting: Advanced Practice Midwife

## 2017-12-05 VITALS — BP 109/62 | HR 92 | Wt 159.0 lb

## 2017-12-05 DIAGNOSIS — IMO0002 Reserved for concepts with insufficient information to code with codable children: Secondary | ICD-10-CM | POA: Insufficient documentation

## 2017-12-05 DIAGNOSIS — O409XX0 Polyhydramnios, unspecified trimester, not applicable or unspecified: Secondary | ICD-10-CM | POA: Insufficient documentation

## 2017-12-05 DIAGNOSIS — Z23 Encounter for immunization: Secondary | ICD-10-CM | POA: Diagnosis not present

## 2017-12-05 DIAGNOSIS — O4440 Low lying placenta NOS or without hemorrhage, unspecified trimester: Secondary | ICD-10-CM

## 2017-12-05 DIAGNOSIS — Z3402 Encounter for supervision of normal first pregnancy, second trimester: Secondary | ICD-10-CM

## 2017-12-05 DIAGNOSIS — Z3403 Encounter for supervision of normal first pregnancy, third trimester: Secondary | ICD-10-CM

## 2017-12-05 DIAGNOSIS — O3663X0 Maternal care for excessive fetal growth, third trimester, not applicable or unspecified: Secondary | ICD-10-CM

## 2017-12-05 DIAGNOSIS — O3660X0 Maternal care for excessive fetal growth, unspecified trimester, not applicable or unspecified: Secondary | ICD-10-CM | POA: Insufficient documentation

## 2017-12-05 DIAGNOSIS — Z3A32 32 weeks gestation of pregnancy: Secondary | ICD-10-CM

## 2017-12-05 DIAGNOSIS — Z1389 Encounter for screening for other disorder: Secondary | ICD-10-CM

## 2017-12-05 DIAGNOSIS — O403XX0 Polyhydramnios, third trimester, not applicable or unspecified: Secondary | ICD-10-CM

## 2017-12-05 DIAGNOSIS — Z331 Pregnant state, incidental: Secondary | ICD-10-CM

## 2017-12-05 LAB — POCT URINALYSIS DIPSTICK OB
Blood, UA: NEGATIVE
GLUCOSE, UA: NEGATIVE
GLUCOSE, UA: NEGATIVE
KETONES UA: NEGATIVE
Leukocytes, UA: NEGATIVE
Nitrite, UA: NEGATIVE
POC,PROTEIN,UA: NEGATIVE
POC,PROTEIN,UA: NEGATIVE

## 2017-12-05 NOTE — Progress Notes (Signed)
[place] 14+8 wks,cephalic,anterior placenta gr 1,placenta is not low,polyhydramnios 28 cm,fhr 157 bpm,efw 2595 g 99%

## 2017-12-05 NOTE — Progress Notes (Signed)
  G1P0000 57w1dEstimated Date of Delivery: 01/29/18  Blood pressure 109/62, pulse 92, weight 159 lb (72.1 kg), last menstrual period 04/24/2017.   BP weight and urine results all reviewed and noted.  Please refer to the obstetrical flow sheet for the fundal height and fetal heart rate documentation:  Patient reports good fetal movement, denies any bleeding and no rupture of membranes symptoms or regular contractions. Patient is without complaints. All questions were answered.   Physical Assessment:   Vitals:   12/05/17 1612  BP: 109/62  Pulse: 92  Weight: 159 lb (72.1 kg)  Body mass index is 25.66 kg/m.        Physical Examination:   General appearance: Well appearing, and in no distress  Mental status: Alert, oriented to person, place, and time  Skin: Warm & dry  Cardiovascular: Normal heart rate noted  Respiratory: Normal respiratory effort, no distress  Abdomen: Soft, gravid, nontender  Pelvic: Cervical exam deferred         Extremities: Edema: Trace  Fetal Status:     Movement: Present    [place]361+6wks,cephalic,anterior placenta gr 1,placenta is not low,polyhydramnios 28 cm,fhr 157 bpm,efw 2595 g 99%        Electronically    Results for orders placed or performed in visit on 12/05/17 (from the past 24 hour(s))  POC Urinalysis Dipstick OB   Collection Time: 12/05/17  4:13 PM  Result Value Ref Range   Color, UA     Clarity, UA     Glucose, UA Negative Negative   Bilirubin, UA     Ketones, UA neg    Spec Grav, UA     Blood, UA neg    pH, UA     POC Protein UA Negative Negative, Trace   Urobilinogen, UA     Nitrite, UA neg    Leukocytes, UA Negative Negative   Appearance     Odor    Results for orders placed or performed in visit on 10/01/17 (from the past 24 hour(s))  POC Urinalysis Dipstick OB   Collection Time: 12/05/17  4:14 PM  Result Value Ref Range   Color, UA     Clarity, UA     Glucose, UA Negative Negative   Bilirubin, UA     Ketones, UA     Spec Grav, UA     Blood, UA     pH, UA     POC Protein UA Negative Negative, Trace   Urobilinogen, UA     Nitrite, UA     Leukocytes, UA     Appearance     Odor       Orders Placed This Encounter  Procedures  . Tdap vaccine greater than or equal to 7yo IM  . POC Urinalysis Dipstick OB     Polyhydramnios EFW >99%  Did gtt w/sprite d/t citric acid allergy. Rx for supplies to do QID BS testing for a few weeks  Plan:  Growth US/AFI in 4 weeks  Return in about 2 weeks (around 12/19/2017) for HROB.  Go over blood sugars

## 2017-12-05 NOTE — Patient Instructions (Addendum)
Check Blood Sugar fasting (before you eat or drink in the morning--DO NOT drink anything other than water throughout the night).  Also check blood sugar 2 hours after each meal.  Write all of these results down and bring to your next appointment.    Polyhydramnios When a woman becomes pregnant, a sac is formed around the fertilized egg (embryo) and later the growing baby (fetus). This sac is called the amniotic sac. The amniotic sac is filled with fluid. It gets bigger as the pregnancy grows. When there is too much fluid in the sac, it is called polyhydramnios. All babies born with polyhydramnios should be checked for congenital abnormalities. The amniotic fluid cushions and protects the baby. It also provides the baby with fluids and is crucial to normal development. Your baby breathes this fluid into its lungs and swallows it. This helps promote the healthy growth of the lungs and gastrointestinal tract. Amniotic fluid also helps the baby move around, helping with the normal development of muscle and bone. What are the causes? This condition is caused by:  Diabetes mellitus.  Down's syndrome, fetal abnormalities of the intestinal tract, and anencephaly (fetus without brain), all of which can prevent the fetus from swallowing the amniotic fluid.  One twins passes (transfuses) its blood into the other twin (twin-twin transfusion syndrome).  Medical illness of the mother, e.g., kidney or heart disease.  Tumor (chorioangioma) of the placenta.  What are the signs or symptoms? Symptoms of this condition include:  Enlarged uterus. The size of the uterus enlarges beyond what it should be for that particular time of the pregnancy.  Pressure and discomfort. The mother may feel more pressure and discomfort than should be expected.  Quick and unexpected enlargement of the mother's stomach.  How is this diagnosed? This condition is diagnosed when your health care provider measures you and notices  that your uterus is beyond the size that is consistent with the time of the pregnancy.  An ultrasound is then used (abdominally or vaginally) to: ? See if you are carrying twins or more. ? Measure the growth of the baby. ? Look for birth defects. ? Measure the amount of fluid in the amniotic sac.  Amniotic Fluid Index (AFI) measures the amount of fluid in the amniotic sac in four different areas. If there is more than 24 centimeters, you have polyhydramnios. How is this treated? This condition may be treated by:  Removing some fluid from the amniotic sac.  Taking medications that lower the fluids in your body.  Stopping the use of salt or salty foods because it causes you to keep fluid in your body (retention).  Follow these instructions at home:  Keep all your prenatal visits as told by your health care provider. It is important to follow your health care provider's recommendations.  Do not eat a lot of salt and salty foods.  If you have diabetes, keep it under control.  If you have heart or kidney disease, treat the disease as told by your health care provider. Contact a health care provider if:  You think your uterus has grown too fast in a short period of time.  You feel a great amount of pressure in your lower belly (pelvis) and are more uncomfortable than expected. Get help right away if:  You have a gush of fluid or are leaking fluid from your vagina.  You stop feeling the baby move.  You do not feel the baby kicking as much as usual.  You  have a hard time keeping your diabetes under control.  You are having problems with your heart or kidney disease. This information is not intended to replace advice given to you by your health care provider. Make sure you discuss any questions you have with your health care provider. Document Released: 05/20/2002 Document Revised: 08/05/2015 Document Reviewed: 09/26/2012 Elsevier Interactive Patient Education  [employer].

## 2017-12-12 DIAGNOSIS — Z029 Encounter for administrative examinations, unspecified: Secondary | ICD-10-CM

## 2017-12-19 ENCOUNTER — Encounter: Payer: Self-pay | Admitting: Advanced Practice Midwife

## 2017-12-19 ENCOUNTER — Ambulatory Visit (INDEPENDENT_AMBULATORY_CARE_PROVIDER_SITE_OTHER): Payer: No Typology Code available for payment source | Admitting: Advanced Practice Midwife

## 2017-12-19 VITALS — BP 106/61 | HR 88 | Wt 162.0 lb

## 2017-12-19 DIAGNOSIS — O3663X0 Maternal care for excessive fetal growth, third trimester, not applicable or unspecified: Secondary | ICD-10-CM

## 2017-12-19 DIAGNOSIS — Z3A34 34 weeks gestation of pregnancy: Secondary | ICD-10-CM

## 2017-12-19 DIAGNOSIS — O403XX0 Polyhydramnios, third trimester, not applicable or unspecified: Secondary | ICD-10-CM

## 2017-12-19 DIAGNOSIS — Z1389 Encounter for screening for other disorder: Secondary | ICD-10-CM

## 2017-12-19 DIAGNOSIS — Z331 Pregnant state, incidental: Secondary | ICD-10-CM

## 2017-12-19 DIAGNOSIS — O0993 Supervision of high risk pregnancy, unspecified, third trimester: Secondary | ICD-10-CM

## 2017-12-19 LAB — POCT URINALYSIS DIPSTICK OB
Blood, UA: NEGATIVE
GLUCOSE, UA: NEGATIVE
Ketones, UA: NEGATIVE
LEUKOCYTES UA: NEGATIVE
Nitrite, UA: NEGATIVE
POC,PROTEIN,UA: NEGATIVE
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
## 2017-12-19 NOTE — Patient Instructions (Signed)
<  95 Fasting and <120 after meals (2 hours)

## 2017-12-31 ENCOUNTER — Telehealth: Payer: Self-pay | Admitting: *Deleted

## 2017-12-31 NOTE — Telephone Encounter (Signed)
Patient states she started having contraction during the night and were about 15 minutes a part. Says they are now every 10 minutes and is feeling them in her lower back.  No bleeding or leaking noted and baby is active. During conversation patient was noted to be "chuckling" at times.  I discussed labor signs with patient and encouraged her to take a warm bath/shower, try Tylenol or Benadryl and if contractions became closer (about every 5 minutes) and she was starting to get really uncomfortable, to call our office or go to [facility] for eval.  Pt verbalized understanding with all questions answered.

## 2018-01-02 ENCOUNTER — Ambulatory Visit (INDEPENDENT_AMBULATORY_CARE_PROVIDER_SITE_OTHER): Payer: No Typology Code available for payment source

## 2018-01-02 ENCOUNTER — Ambulatory Visit (INDEPENDENT_AMBULATORY_CARE_PROVIDER_SITE_OTHER): Payer: No Typology Code available for payment source | Admitting: Advanced Practice Midwife

## 2018-01-02 ENCOUNTER — Encounter: Payer: Self-pay | Admitting: Advanced Practice Midwife

## 2018-01-02 VITALS — BP 114/72 | HR 107 | Wt 164.0 lb

## 2018-01-02 DIAGNOSIS — Z3483 Encounter for supervision of other normal pregnancy, third trimester: Secondary | ICD-10-CM

## 2018-01-02 DIAGNOSIS — O0993 Supervision of high risk pregnancy, unspecified, third trimester: Secondary | ICD-10-CM | POA: Diagnosis not present

## 2018-01-02 DIAGNOSIS — Z1389 Encounter for screening for other disorder: Secondary | ICD-10-CM

## 2018-01-02 DIAGNOSIS — O403XX0 Polyhydramnios, third trimester, not applicable or unspecified: Secondary | ICD-10-CM

## 2018-01-02 DIAGNOSIS — Z331 Pregnant state, incidental: Secondary | ICD-10-CM

## 2018-01-02 DIAGNOSIS — O3663X0 Maternal care for excessive fetal growth, third trimester, not applicable or unspecified: Secondary | ICD-10-CM | POA: Diagnosis not present

## 2018-01-02 DIAGNOSIS — Z3A36 36 weeks gestation of pregnancy: Secondary | ICD-10-CM

## 2018-01-02 LAB — POCT URINALYSIS DIPSTICK OB
GLUCOSE, UA: NEGATIVE
Ketones, UA: NEGATIVE
LEUKOCYTES UA: NEGATIVE
NITRITE UA: NEGATIVE
PROTEIN: NEGATIVE
RBC UA: NEGATIVE

## 2018-01-02 NOTE — Progress Notes (Signed)
[place] 17+4 wks,cephalic,anterior placenta gr 3,polyhydramnios AFI 26 cm,fhr 127 bpm,EFW 3758 g 99%

## 2018-01-02 NOTE — Patient Instructions (Addendum)
Get OTC 7 day yeast infection vaginal cream!  BENEFITS OF BREASTFEEDING Many women wonder if they should breastfeed. Research shows that breast milk contains the perfect balance of vitamins, protein and fat that your baby needs to grow. It also contains antibodies that help your baby's immune system to fight off viruses and bacteria and can reduce the risk of sudden infant death syndrome (SIDS). In addition, the colostrum (a fluid secreted from the breast in the first few days after delivery) helps your newborn's digestive system to grow and function well. Breast milk is easier to digest than formula. Also, if your baby is born preterm, breast milk can help to reduce both short- and long-term health problems. BENEFITS OF BREASTFEEDING FOR MOM . Breastfeeding causes a hormone to be released that helps the uterus to contract and return to its normal size more quickly. . It aids in postpartum weight loss, reduces risk of breast and ovarian cancer, heart disease and rheumatoid arthritis. . It decreases the amount of bleeding after the baby is born. benefits of breastfeeding for baby . Provides comfort and nutrition . Protects baby against - Obesity - Diabetes - Asthma - Childhood cancers - Heart disease - Ear infections - Diarrhea - Pneumonia - Stomach problems - Serious allergies - Skin rashes . Promotes growth and development . Reduces the risk of baby having Sudden Infant Death Syndrome (SIDS) only breastmilk for the first 6 months . Protects baby against diseases/allergies . It's the perfect amount for tiny bellies . It restores baby's energy . Provides the best nutrition for baby . Giving water or formula can make baby more likely to get sick, decrease Mom's milk supply, make baby less content with breastfeeding Skin to Skin After delivery, the staff will place your baby on your chest. This helps with the following: . Regulates baby's temperature, breathing, heart rate and blood  sugar . Increases Mom's milk supply . Promotes bonding . Keeps baby and Mom calm and decreases baby's crying Rooming In Your baby will stay in your room with you for the entire time you are in the hospital. This helps with the following: . Allows Mom to learn baby's feeding cues - Fluttering eyes - Sucking on tongue or hand - Rooting (opens mouth and turns head) - Nuzzling into the breast - Bringing hand to mouth . Allows breastfeeding on demand (when your baby is ready) . Helps baby to be calm and content . Ensures a good milk supply . Prevents complications with breastfeeding . Allows parents to learn to care for baby . Allows you to request assistance with breastfeeding Importance of a good latch . Increases milk transfer to baby - baby gets enough milk . Ensures you have enough milk for your baby . Decreases nipple soreness . Don't use pacifiers and bottles - these cause baby to suck differently than breastfeeding . Promotes continuation of breastfeeding Risks of Formula Supplementation with Breastfeeding Giving your infant formula in addition to your breast-milk EXCEPT when medically necessary can lead to: Marland Kitchen Decreases your milk supply  . Loss of confidence in yourself for providing baby's nutrition  . Engorgement and possibly mastitis  . Asthma & allergies in the baby BREASTFEEDING FAQS How long should I breastfeed my baby? It is recommended that you provide your baby with breast milk only for the first 6 months and then continue for the first year and longer as desired. During the first few weeks after birth, your baby will need to feed 8-12 times every 24  hours, or every 2-3 hours. They will likely feed for 15-30 minutes. How can I help my baby begin breastfeeding? Babies are born with an instinct to breastfeed. A healthy baby can begin breastfeeding right away without specific help. At the hospital, a nurse (or lactation consultant) will help you begin the process and will  give you tips on good positioning. It may be helpful to take a breastfeeding class before you deliver in order to know what to expect. How can I help my baby latch on? In order to assist your baby in latching-on, cup your breast in your hand and stroke your baby's lower lip with your nipple to stimulate your baby's rooting reflex. Your baby will look like he or she is yawning, at which point you should bring the baby towards your breast, while aiming the nipple at the roof of his or her mouth. Remember to bring the baby towards you and not your breast towards the baby. How can I tell if my baby is latched-on? Your baby will have all of your nipple and part of the dark area around the nipple in his or her mouth and your baby's nose will be touching your breast. You should see or hear the baby swallowing. If the baby is not latched-on properly, start the process over. To remove the suction, insert a clean finger between your breast and the baby's mouth. Should I switch breasts during feeding? After feeding on one side, switch the baby to your other breast. If he or she does not continue feeding - that is OK. Your baby will not necessarily need to feed from both breasts in a single feeding. On the next feeding, start with the other breast for efficiency and comfort. How can I tell if my baby is hungry? When your baby is hungry, they will nuzzle against your breast, make sucking noises and tongue motions and may put their hands near their mouth. Crying is a late sign of hunger, so you should not wait until this point. When they have received enough milk, they will unlatch from the breast. Is it okay to use a pacifier? Until your baby gets the hang of breastfeeding, experts recommend limiting pacifier usage. If you have questions about this, please contact your pediatrician. What can I do to ensure proper nutrition while breastfeeding? . Make sure that you support your own health and your baby's by eating a  healthy, well-balanced diet . Your provider may recommend that you continue to take your prenatal vitamin . Drink plenty of fluids. It is a good rule to drink one glass of water before or after feeding . Alcohol will remain in the breast milk for as long as it will remain in the blood stream. If you choose to have a drink, it is recommended that you wait at least 2 hours before feeding . Moderate amounts of caffeine are OK . Some over-the-counter or prescription medications are not recommended during breastfeeding. Check with your provider if you have questions What types of birth control methods are safe while breastfeeding? Progestin-only methods, including a daily pill, an IUD, the implant and the injection are safe while breastfeeding. Methods that contain estrogen (such as combination birth control pills, the vaginal ring and the patch) should not be used during the first month of breastfeeding as these can decrease your milk supply.
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
## 2018-01-04 LAB — GC/CHLAMYDIA PROBE AMP
CHLAMYDIA, DNA PROBE: NEGATIVE
Neisseria gonorrhoeae by PCR: NEGATIVE

## 2018-01-04 LAB — STREP GP B NAA: Strep Gp B NAA: NEGATIVE

## 2018-01-09 ENCOUNTER — Ambulatory Visit (INDEPENDENT_AMBULATORY_CARE_PROVIDER_SITE_OTHER): Payer: No Typology Code available for payment source | Admitting: Obstetrics and Gynecology

## 2018-01-09 ENCOUNTER — Encounter: Payer: Self-pay | Admitting: Obstetrics and Gynecology

## 2018-01-09 VITALS — BP 111/72 | HR 107 | Wt 168.4 lb

## 2018-01-09 DIAGNOSIS — Z3A37 37 weeks gestation of pregnancy: Secondary | ICD-10-CM

## 2018-01-09 DIAGNOSIS — O3663X0 Maternal care for excessive fetal growth, third trimester, not applicable or unspecified: Secondary | ICD-10-CM

## 2018-01-09 DIAGNOSIS — Z3480 Encounter for supervision of other normal pregnancy, unspecified trimester: Secondary | ICD-10-CM | POA: Insufficient documentation

## 2018-01-09 DIAGNOSIS — O403XX0 Polyhydramnios, third trimester, not applicable or unspecified: Secondary | ICD-10-CM

## 2018-01-09 NOTE — Progress Notes (Signed)
Subjective:  Maria Cooper is a 22 y.o. G1P0000 at 72w1dbeing seen today for ongoing prenatal care.  She is currently monitored for the following issues for this high-risk pregnancy and has Dyspepsia; Esophageal reflux; Celiac sprue; High-risk pregnancy; Polyhydramnios; Fetal macrosomia; and Supervision of other normal pregnancy, antepartum on their problem list.  Patient reports general discomforts of pregnancy.  Contractions: Irregular. Vag. Bleeding: None.  Movement: Present. Denies leaking of fluid.   The following portions of the patient's history were reviewed and updated as appropriate: allergies, current medications, past family history, past medical history, past social history, past surgical history and problem list. Problem list updated.  Objective:   Vitals:   01/09/18 1606  BP: 111/72  Pulse: (!) 107  Weight: 168 lb 6.4 oz (76.4 kg)    Fetal Status:     Movement: Present     General:  Alert, oriented and cooperative. Patient is in no acute distress.  Skin: Skin is warm and dry. No rash noted.   Cardiovascular: Normal heart rate noted  Respiratory: Normal respiratory effort, no problems with respiration noted  Abdomen: Soft, gravid, appropriate for gestational age. Pain/Pressure: Present     Pelvic:  Cervical exam performed        Extremities: Normal range of motion.  Edema: None  Mental Status: Normal mood and affect. Normal behavior. Normal judgment and thought content.   Urinalysis:      Assessment and Plan:  Pregnancy: G1P0000 at 340w1d1. Supervision of other normal pregnancy, antepartum Stable Labor precautions  2. Polyhydramnios in third trimester complication, single or unspecified fetus Last AFI 26  3. Fetal macrosomia in third trimester, single or unspecified fetus EFW > 99 %  Term labor symptoms and general obstetric precautions including but not limited to vaginal bleeding, contractions, leaking of fluid and fetal movement were reviewed in detail  with the patient. Please refer to After Visit Summary for other counseling recommendations.  Return for OB visit.   ErChancy MilroyMD

## 2018-01-09 NOTE — Patient Instructions (Signed)
Third Trimester of Pregnancy The third trimester is from week 28 through week 40 (months 7 through 9). The third trimester is a time when the unborn baby (fetus) is growing rapidly. At the end of the ninth month, the fetus is about 20 inches in length and weighs 6-10 pounds. Body changes during your third trimester Your body will continue to go through many changes during pregnancy. The changes vary from woman to woman. During the third trimester:  Your weight will continue to increase. You can expect to gain 25-35 pounds (11-16 kg) by the end of the pregnancy.  You may begin to get stretch marks on your hips, abdomen, and breasts.  You may urinate more often because the fetus is moving lower into your pelvis and pressing on your bladder.  You may develop or continue to have heartburn. This is caused by increased hormones that slow down muscles in the digestive tract.  You may develop or continue to have constipation because increased hormones slow digestion and cause the muscles that push waste through your intestines to relax.  You may develop hemorrhoids. These are swollen veins (varicose veins) in the rectum that can itch or be painful.  You may develop swollen, bulging veins (varicose veins) in your legs.  You may have increased body aches in the pelvis, back, or thighs. This is due to weight gain and increased hormones that are relaxing your joints.  You may have changes in your hair. These can include thickening of your hair, rapid growth, and changes in texture. Some women also have hair loss during or after pregnancy, or hair that feels dry or thin. Your hair will most likely return to normal after your baby is born.  Your breasts will continue to grow and they will continue to become tender. A yellow fluid (colostrum) may leak from your breasts. This is the first milk you are producing for your baby.  Your belly button may stick out.  You may notice more swelling in your hands,  face, or ankles.  You may have increased tingling or numbness in your hands, arms, and legs. The skin on your belly may also feel numb.  You may feel short of breath because of your expanding uterus.  You may have more problems sleeping. This can be caused by the size of your belly, increased need to urinate, and an increase in your body's metabolism.  You may notice the fetus "dropping," or moving lower in your abdomen (lightening).  You may have increased vaginal discharge.  You may notice your joints feel loose and you may have pain around your pelvic bone.  What to expect at prenatal visits You will have prenatal exams every 2 weeks until week 36. Then you will have weekly prenatal exams. During a routine prenatal visit:  You will be weighed to make sure you and the baby are growing normally.  Your blood pressure will be taken.  Your abdomen will be measured to track your baby's growth.  The fetal heartbeat will be listened to.  Any test results from the previous visit will be discussed.  You may have a cervical check near your due date to see if your cervix has softened or thinned (effaced).  You will be tested for Group B streptococcus. This happens between 35 and 37 weeks.  Your health care provider may ask you:  What your birth plan is.  How you are feeling.  If you are feeling the baby move.  If you have had  any abnormal symptoms, such as leaking fluid, bleeding, severe headaches, or abdominal cramping.  If you are using any tobacco products, including cigarettes, chewing tobacco, and electronic cigarettes.  If you have any questions.  Other tests or screenings that may be performed during your third trimester include:  Blood tests that check for low iron levels (anemia).  Fetal testing to check the health, activity level, and growth of the fetus. Testing is done if you have certain medical conditions or if there are problems during the  pregnancy.  Nonstress test (NST). This test checks the health of your baby to make sure there are no signs of problems, such as the baby not getting enough oxygen. During this test, a belt is placed around your belly. The baby is made to move, and its heart rate is monitored during movement.  What is false labor? False labor is a condition in which you feel small, irregular tightenings of the muscles in the womb (contractions) that usually go away with rest, changing position, or drinking water. These are called Braxton Hicks contractions. Contractions may last for hours, days, or even weeks before true labor sets in. If contractions come at regular intervals, become more frequent, increase in intensity, or become painful, you should see your health care provider. What are the signs of labor?  Abdominal cramps.  Regular contractions that start at 10 minutes apart and become stronger and more frequent with time.  Contractions that start on the top of the uterus and spread down to the lower abdomen and back.  Increased pelvic pressure and dull back pain.  A watery or bloody mucus discharge that comes from the vagina.  Leaking of amniotic fluid. This is also known as your "water breaking." It could be a slow trickle or a gush. Let your health care provider know if it has a color or strange odor. If you have any of these signs, call your health care provider right away, even if it is before your due date. Follow these instructions at home: Medicines  Follow your health care provider's instructions regarding medicine use. Specific medicines may be either safe or unsafe to take during pregnancy.  Take a prenatal vitamin that contains at least 600 micrograms (mcg) of folic acid.  If you develop constipation, try taking a stool softener if your health care provider approves. Eating and drinking  Eat a balanced diet that includes fresh fruits and vegetables, whole grains, good sources of protein  such as meat, eggs, or tofu, and low-fat dairy. Your health care provider will help you determine the amount of weight gain that is right for you.  Avoid raw meat and uncooked cheese. These carry germs that can cause birth defects in the baby.  If you have low calcium intake from food, talk to your health care provider about whether you should take a daily calcium supplement.  Eat four or five small meals rather than three large meals a day.  Limit foods that are high in fat and processed sugars, such as fried and sweet foods.  To prevent constipation: ? Drink enough fluid to keep your urine clear or pale yellow. ? Eat foods that are high in fiber, such as fresh fruits and vegetables, whole grains, and beans. Activity  Exercise only as directed by your health care provider. Most women can continue their usual exercise routine during pregnancy. Try to exercise for 30 minutes at least 5 days a week. Stop exercising if you experience uterine contractions.  Avoid heavy  lifting.  Do not exercise in extreme heat or humidity, or at high altitudes.  Wear low-heel, comfortable shoes.  Practice good posture.  You may continue to have sex unless your health care provider tells you otherwise. Relieving pain and discomfort  Take frequent breaks and rest with your legs elevated if you have leg cramps or low back pain.  Take warm sitz baths to soothe any pain or discomfort caused by hemorrhoids. Use hemorrhoid cream if your health care provider approves.  Wear a good support bra to prevent discomfort from breast tenderness.  If you develop varicose veins: ? Wear support pantyhose or compression stockings as told by your healthcare provider. ? Elevate your feet for 15 minutes, 3-4 times a day. Prenatal care  Write down your questions. Take them to your prenatal visits.  Keep all your prenatal visits as told by your health care provider. This is important. Safety  Wear your seat belt at  all times when driving.  Make a list of emergency phone numbers, including numbers for family, friends, the hospital, and police and fire departments. General instructions  Avoid cat litter boxes and soil used by cats. These carry germs that can cause birth defects in the baby. If you have a cat, ask someone to clean the litter box for you.  Do not travel far distances unless it is absolutely necessary and only with the approval of your health care provider.  Do not use hot tubs, steam rooms, or saunas.  Do not drink alcohol.  Do not use any products that contain nicotine or tobacco, such as cigarettes and e-cigarettes. If you need help quitting, ask your health care provider.  Do not use any medicinal herbs or unprescribed drugs. These chemicals affect the formation and growth of the baby.  Do not douche or use tampons or scented sanitary pads.  Do not cross your legs for long periods of time.  To prepare for the arrival of your baby: ? Take prenatal classes to understand, practice, and ask questions about labor and delivery. ? Make a trial run to the hospital. ? Visit the hospital and tour the maternity area. ? Arrange for maternity or paternity leave through employers. ? Arrange for family and friends to take care of pets while you are in the hospital. ? Purchase a rear-facing car seat and make sure you know how to install it in your car. ? Pack your hospital bag. ? Prepare the baby's nursery. Make sure to remove all pillows and stuffed animals from the baby's crib to prevent suffocation.  Visit your dentist if you have not gone during your pregnancy. Use a soft toothbrush to brush your teeth and be gentle when you floss. Contact a health care provider if:  You are unsure if you are in labor or if your water has broken.  You become dizzy.  You have mild pelvic cramps, pelvic pressure, or nagging pain in your abdominal area.  You have lower back pain.  You have persistent  nausea, vomiting, or diarrhea.  You have an unusual or bad smelling vaginal discharge.  You have pain when you urinate. Get help right away if:  Your water breaks before 37 weeks.  You have regular contractions less than 5 minutes apart before 37 weeks.  You have a fever.  You are leaking fluid from your vagina.  You have spotting or bleeding from your vagina.  You have severe abdominal pain or cramping.  You have rapid weight loss or weight gain.  You have shortness of breath with chest pain.  You notice sudden or extreme swelling of your face, hands, ankles, feet, or legs.  Your baby makes fewer than 10 movements in 2 hours.  You have severe headaches that do not go away when you take medicine.  You have vision changes. Summary  The third trimester is from week 28 through week 40, months 7 through 9. The third trimester is a time when the unborn baby (fetus) is growing rapidly.  During the third trimester, your discomfort may increase as you and your baby continue to gain weight. You may have abdominal, leg, and back pain, sleeping problems, and an increased need to urinate.  During the third trimester your breasts will keep growing and they will continue to become tender. A yellow fluid (colostrum) may leak from your breasts. This is the first milk you are producing for your baby.  False labor is a condition in which you feel small, irregular tightenings of the muscles in the womb (contractions) that eventually go away. These are called Braxton Hicks contractions. Contractions may last for hours, days, or even weeks before true labor sets in.  Signs of labor can include: abdominal cramps; regular contractions that start at 10 minutes apart and become stronger and more frequent with time; watery or bloody mucus discharge that comes from the vagina; increased pelvic pressure and dull back pain; and leaking of amniotic fluid. This information is not intended to replace advice  given to you by your health care provider. Make sure you discuss any questions you have with your health care provider. Document Released: 02/21/2001 Document Revised: 08/05/2015 Document Reviewed: 04/30/2012 [employer] Interactive Patient Education  2017 [place]. Breastfeeding Choosing to breastfeed is one of the best decisions you can make for yourself and your baby. A change in hormones during pregnancy causes your breasts to make breast milk in your milk-producing glands. Hormones prevent breast milk from being released before your baby is born. They also prompt milk flow after birth. Once breastfeeding has begun, thoughts of your baby, as well as his or her sucking or crying, can stimulate the release of milk from your milk-producing glands. Benefits of breastfeeding Research shows that breastfeeding offers many health benefits for infants and mothers. It also offers a cost-free and convenient way to feed your baby. For your baby  Your first milk (colostrum) helps your baby's digestive system to function better.  Special cells in your milk (antibodies) help your baby to fight off infections.  Breastfed babies are less likely to develop asthma, allergies, obesity, or type 2 diabetes. They are also at lower risk for sudden infant death syndrome (SIDS).  Nutrients in breast milk are better able to meet your baby's needs compared to infant formula.  Breast milk improves your baby's brain development. For you  Breastfeeding helps to create a very special bond between you and your baby.  Breastfeeding is convenient. Breast milk costs nothing and is always available at the correct temperature.  Breastfeeding helps to burn calories. It helps you to lose the weight that you gained during pregnancy.  Breastfeeding makes your uterus return faster to its size before pregnancy. It also slows bleeding (lochia) after you give birth.  Breastfeeding helps to lower your risk of developing type 2  diabetes, osteoporosis, rheumatoid arthritis, cardiovascular disease, and breast, ovarian, uterine, and endometrial cancer later in life. Breastfeeding basics Starting breastfeeding  Find a comfortable place to sit or lie down, with your neck and back well-supported.  Place a pillow or a rolled-up blanket under your baby to bring him or her to the level of your breast (if you are seated). Nursing pillows are specially designed to help support your arms and your baby while you breastfeed.  Make sure that your baby's tummy (abdomen) is facing your abdomen.  Gently massage your breast. With your fingertips, massage from the outer edges of your breast inward toward the nipple. This encourages milk flow. If your milk flows slowly, you may need to continue this action during the feeding.  Support your breast with 4 fingers underneath and your thumb above your nipple (make the letter "C" with your hand). Make sure your fingers are well away from your nipple and your baby's mouth.  Stroke your baby's lips gently with your finger or nipple.  When your baby's mouth is open wide enough, quickly bring your baby to your breast, placing your entire nipple and as much of the areola as possible into your baby's mouth. The areola is the colored area around your nipple. ? More areola should be visible above your baby's upper lip than below the lower lip. ? Your baby's lips should be opened and extended outward (flanged) to ensure an adequate, comfortable latch. ? Your baby's tongue should be between his or her lower gum and your breast.  Make sure that your baby's mouth is correctly positioned around your nipple (latched). Your baby's lips should create a seal on your breast and be turned out (everted).  It is common for your baby to suck about 2-3 minutes in order to start the flow of breast milk. Latching Teaching your baby how to latch onto your breast properly is very important. An improper latch can  cause nipple pain, decreased milk supply, and poor weight gain in your baby. Also, if your baby is not latched onto your nipple properly, he or she may swallow some air during feeding. This can make your baby fussy. Burping your baby when you switch breasts during the feeding can help to get rid of the air. However, teaching your baby to latch on properly is still the best way to prevent fussiness from swallowing air while breastfeeding. Signs that your baby has successfully latched onto your nipple  Silent tugging or silent sucking, without causing you pain. Infant's lips should be extended outward (flanged).  Swallowing heard between every 3-4 sucks once your milk has started to flow (after your let-down milk reflex occurs).  Muscle movement above and in front of his or her ears while sucking.  Signs that your baby has not successfully latched onto your nipple  Sucking sounds or smacking sounds from your baby while breastfeeding.  Nipple pain.  If you think your baby has not latched on correctly, slip your finger into the corner of your baby's mouth to break the suction and place it between your baby's gums. Attempt to start breastfeeding again. Signs of successful breastfeeding Signs from your baby  Your baby will gradually decrease the number of sucks or will completely stop sucking.  Your baby will fall asleep.  Your baby's body will relax.  Your baby will retain a small amount of milk in his or her mouth.  Your baby will let go of your breast by himself or herself.  Signs from you  Breasts that have increased in firmness, weight, and size 1-3 hours after feeding.  Breasts that are softer immediately after breastfeeding.  Increased milk volume, as well as a change in milk consistency and  color by the fifth day of breastfeeding.  Nipples that are not sore, cracked, or bleeding.  Signs that your baby is getting enough milk  Wetting at least 1-2 diapers during the first 24  hours after birth.  Wetting at least 5-6 diapers every 24 hours for the first week after birth. The urine should be clear or pale yellow by the age of 5 days.  Wetting 6-8 diapers every 24 hours as your baby continues to grow and develop.  At least 3 stools in a 24-hour period by the age of 5 days. The stool should be soft and yellow.  At least 3 stools in a 24-hour period by the age of 7 days. The stool should be seedy and yellow.  No loss of weight greater than 10% of birth weight during the first 3 days of life.  Average weight gain of 4-7 oz (113-198 g) per week after the age of 4 days.  Consistent daily weight gain by the age of 5 days, without weight loss after the age of 2 weeks. After a feeding, your baby may spit up a small amount of milk. This is normal. Breastfeeding frequency and duration Frequent feeding will help you make more milk and can prevent sore nipples and extremely full breasts (breast engorgement). Breastfeed when you feel the need to reduce the fullness of your breasts or when your baby shows signs of hunger. This is called "breastfeeding on demand." Signs that your baby is hungry include:  Increased alertness, activity, or restlessness.  Movement of the head from side to side.  Opening of the mouth when the corner of the mouth or cheek is stroked (rooting).  Increased sucking sounds, smacking lips, cooing, sighing, or squeaking.  Hand-to-mouth movements and sucking on fingers or hands.  Fussing or crying.  Avoid introducing a pacifier to your baby in the first 4-6 weeks after your baby is born. After this time, you may choose to use a pacifier. Research has shown that pacifier use during the first year of a baby's life decreases the risk of sudden infant death syndrome (SIDS). Allow your baby to feed on each breast as long as he or she wants. When your baby unlatches or falls asleep while feeding from the first breast, offer the second breast. Because  newborns are often sleepy in the first few weeks of life, you may need to awaken your baby to get him or her to feed. Breastfeeding times will vary from baby to baby. However, the following rules can serve as a guide to help you make sure that your baby is properly fed:  Newborns (babies 36 weeks of age or younger) may breastfeed every 1-3 hours.  Newborns should not go without breastfeeding for longer than 3 hours during the day or 5 hours during the night.  You should breastfeed your baby a minimum of 8 times in a 24-hour period.  Breast milk pumping Pumping and storing breast milk allows you to make sure that your baby is exclusively fed your breast milk, even at times when you are unable to breastfeed. This is especially important if you go back to work while you are still breastfeeding, or if you are not able to be present during feedings. Your lactation consultant can help you find a method of pumping that works best for you and give you guidelines about how long it is safe to store breast milk. Caring for your breasts while you breastfeed Nipples can become dry, cracked, and sore while  breastfeeding. The following recommendations can help keep your breasts moisturized and healthy:  Avoid using soap on your nipples.  Wear a supportive bra designed especially for nursing. Avoid wearing underwire-style bras or extremely tight bras (sports bras).  Air-dry your nipples for 3-4 minutes after each feeding.  Use only cotton bra pads to absorb leaked breast milk. Leaking of breast milk between feedings is normal.  Use lanolin on your nipples after breastfeeding. Lanolin helps to maintain your skin's normal moisture barrier. Pure lanolin is not harmful (not toxic) to your baby. You may also hand express a few drops of breast milk and gently massage that milk into your nipples and allow the milk to air-dry.  In the first few weeks after giving birth, some women experience breast engorgement.  Engorgement can make your breasts feel heavy, warm, and tender to the touch. Engorgement peaks within 3-5 days after you give birth. The following recommendations can help to ease engorgement:  Completely empty your breasts while breastfeeding or pumping. You may want to start by applying warm, moist heat (in the shower or with warm, water-soaked hand towels) just before feeding or pumping. This increases circulation and helps the milk flow. If your baby does not completely empty your breasts while breastfeeding, pump any extra milk after he or she is finished.  Apply ice packs to your breasts immediately after breastfeeding or pumping, unless this is too uncomfortable for you. To do this: ? Put ice in a plastic bag. ? Place a towel between your skin and the bag. ? Leave the ice on for 20 minutes, 2-3 times a day.  Make sure that your baby is latched on and positioned properly while breastfeeding.  If engorgement persists after 48 hours of following these recommendations, contact your health care provider or a Science writer. Overall health care recommendations while breastfeeding  Eat 3 healthy meals and 3 snacks every day. Well-nourished mothers who are breastfeeding need an additional 450-500 calories a day. You can meet this requirement by increasing the amount of a balanced diet that you eat.  Drink enough water to keep your urine pale yellow or clear.  Rest often, relax, and continue to take your prenatal vitamins to prevent fatigue, stress, and low vitamin and mineral levels in your body (nutrient deficiencies).  Do not use any products that contain nicotine or tobacco, such as cigarettes and e-cigarettes. Your baby may be harmed by chemicals from cigarettes that pass into breast milk and exposure to secondhand smoke. If you need help quitting, ask your health care provider.  Avoid alcohol.  Do not use illegal drugs or marijuana.  Talk with your health care provider before  taking any medicines. These include over-the-counter and prescription medicines as well as vitamins and herbal supplements. Some medicines that may be harmful to your baby can pass through breast milk.  It is possible to become pregnant while breastfeeding. If birth control is desired, ask your health care provider about options that will be safe while breastfeeding your baby. Where to find more information: [employer]: www.llli.org Contact a health care provider if:  You feel like you want to stop breastfeeding or have become frustrated with breastfeeding.  Your nipples are cracked or bleeding.  Your breasts are red, tender, or warm.  You have: ? Painful breasts or nipples. ? A swollen area on either breast. ? A fever or chills. ? Nausea or vomiting. ? Drainage other than breast milk from your nipples.  Your breasts do  not become full before feedings by the fifth day after you give birth.  You feel sad and depressed.  Your baby is: ? Too sleepy to eat well. ? Having trouble sleeping. ? More than 89 week old and wetting fewer than 6 diapers in a 24-hour period. ? Not gaining weight by 78 days of age.  Your baby has fewer than 3 stools in a 24-hour period.  Your baby's skin or the white parts of his or her eyes become yellow. Get help right away if:  Your baby is overly tired (lethargic) and does not want to wake up and feed.  Your baby develops an unexplained fever. Summary  Breastfeeding offers many health benefits for infant and mothers.  Try to breastfeed your infant when he or she shows early signs of hunger.  Gently tickle or stroke your baby's lips with your finger or nipple to allow the baby to open his or her mouth. Bring the baby to your breast. Make sure that much of the areola is in your baby's mouth. Offer one side and burp the baby before you offer the other side.  Talk with your health care provider or lactation consultant if you have  questions or you face problems as you breastfeed. This information is not intended to replace advice given to you by your health care provider. Make sure you discuss any questions you have with your health care provider. Document Released: 02/27/2005 Document Revised: 03/31/2016 Document Reviewed: 03/31/2016 Elsevier Interactive Patient Education  2018 [employer]. Vaginal Delivery Vaginal delivery means that you will give birth by pushing your baby out of your birth canal (vagina). A team of health care providers will help you before, during, and after vaginal delivery. Birth experiences are unique for every woman and every pregnancy, and birth experiences vary depending on where you choose to give birth. What should I do to prepare for my baby's birth? Before your baby is born, it is important to talk with your health care provider about:  Your labor and delivery preferences. These may include: ? Medicines that you may be given. ? How you will manage your pain. This might include non-medical pain relief techniques or injectable pain relief such as epidural analgesia. ? How you and your baby will be monitored during labor and delivery. ? Who may be in the labor and delivery room with you. ? Your feelings about surgical delivery of your baby (cesarean delivery, or C-section) if this becomes necessary. ? Your feelings about receiving donated blood through an IV tube (blood transfusion) if this becomes necessary.  Whether you are able: ? To take pictures or videos of the birth. ? To eat during labor and delivery. ? To move around, walk, or change positions during labor and delivery.  What to expect after your baby is born, such as: ? Whether delayed umbilical cord clamping and cutting is offered. ? Who will care for your baby right after birth. ? Medicines or tests that may be recommended for your baby. ? Whether breastfeeding is supported in your hospital or birth center. ? How long you  will be in the hospital or birth center.  How any medical conditions you have may affect your baby or your labor and delivery experience.  To prepare for your baby's birth, you should also:  Attend all of your health care visits before delivery (prenatal visits) as recommended by your health care provider. This is important.  Prepare your home for your baby's arrival. Make sure that  you have: ? Diapers. ? Baby clothing. ? Feeding equipment. ? Safe sleeping arrangements for you and your baby.  Install a car seat in your vehicle. Have your car seat checked by a certified car seat installer to make sure that it is installed safely.  Think about who will help you with your new baby at home for at least the first several weeks after delivery.  What can I expect when I arrive at the birth center or hospital? Once you are in labor and have been admitted into the hospital or birth center, your health care provider may:  Review your pregnancy history and any concerns you have.  Insert an IV tube into one of your veins. This is used to give you fluids and medicines.  Check your blood pressure, pulse, temperature, and heart rate (vital signs).  Check whether your bag of water (amniotic sac) has broken (ruptured).  Talk with you about your birth plan and discuss pain control options.  Monitoring Your health care provider may monitor your contractions (uterine monitoring) and your baby's heart rate (fetal monitoring). You may need to be monitored:  Often, but not continuously (intermittently).  All the time or for long periods at a time (continuously). Continuous monitoring may be needed if: ? You are taking certain medicines, such as medicine to relieve pain or make your contractions stronger. ? You have pregnancy or labor complications.  Monitoring may be done by:  Placing a special stethoscope or a handheld monitoring device on your abdomen to check your baby's heartbeat, and feeling  your abdomen for contractions. This method of monitoring does not continuously record your baby's heartbeat or your contractions.  Placing monitors on your abdomen (external monitors) to record your baby's heartbeat and the frequency and length of contractions. You may not have to wear external monitors all the time.  Placing monitors inside of your uterus (internal monitors) to record your baby's heartbeat and the frequency, length, and strength of your contractions. ? Your health care provider may use internal monitors if he or she needs more information about the strength of your contractions or your baby's heart rate. ? Internal monitors are put in place by passing a thin, flexible wire through your vagina and into your uterus. Depending on the type of monitor, it may remain in your uterus or on your baby's head until birth. ? Your health care provider will discuss the benefits and risks of internal monitoring with you and will ask for your permission before inserting the monitors.  Telemetry. This is a type of continuous monitoring that can be done with external or internal monitors. Instead of having to stay in bed, you are able to move around during telemetry. Ask your health care provider if telemetry is an option for you.  Physical exam Your health care provider may perform a physical exam. This may include:  Checking whether your baby is positioned: ? With the head toward your vagina (head-down). This is most common. ? With the head toward the top of your uterus (head-up or breech). If your baby is in a breech position, your health care provider may try to turn your baby to a head-down position so you can deliver vaginally. If it does not seem that your baby can be born vaginally, your provider may recommend surgery to deliver your baby. In rare cases, you may be able to deliver vaginally if your baby is head-up (breech delivery). ? Lying sideways (transverse). Babies that are lying  sideways cannot  be delivered vaginally.  Checking your cervix to determine: ? Whether it is thinning out (effacing). ? Whether it is opening up (dilating). ? How low your baby has moved into your birth canal.  What are the three stages of labor and delivery?  Normal labor and delivery is divided into the following three stages: Stage 1  Stage 1 is the longest stage of labor, and it can last for hours or days. Stage 1 includes: ? Early labor. This is when contractions may be irregular, or regular and mild. Generally, early labor contractions are more than 10 minutes apart. ? Active labor. This is when contractions get longer, more regular, more frequent, and more intense. ? The transition phase. This is when contractions happen very close together, are very intense, and may last longer than during any other part of labor.  Contractions generally feel mild, infrequent, and irregular at first. They get stronger, more frequent (about every 2-3 minutes), and more regular as you progress from early labor through active labor and transition.  Many women progress through stage 1 naturally, but you may need help to continue making progress. If this happens, your health care provider may talk with you about: ? Rupturing your amniotic sac if it has not ruptured yet. ? Giving you medicine to help make your contractions stronger and more frequent.  Stage 1 ends when your cervix is completely dilated to 4 inches (10 cm) and completely effaced. This happens at the end of the transition phase. Stage 2  Once your cervix is completely effaced and dilated to 4 inches (10 cm), you may start to feel an urge to push. It is common for the body to naturally take a rest before feeling the urge to push, especially if you received an epidural or certain other pain medicines. This rest period may last for up to 1-2 hours, depending on your unique labor experience.  During stage 2, contractions are generally less  painful, because pushing helps relieve contraction pain. Instead of contraction pain, you may feel stretching and burning pain, especially when the widest part of your baby's head passes through the vaginal opening (crowning).  Your health care provider will closely monitor your pushing progress and your baby's progress through the vagina during stage 2.  Your health care provider may massage the area of skin between your vaginal opening and anus (perineum) or apply warm compresses to your perineum. This helps it stretch as the baby's head starts to crown, which can help prevent perineal tearing. ? In some cases, an incision may be made in your perineum (episiotomy) to allow the baby to pass through the vaginal opening. An episiotomy helps to make the opening of the vagina larger to allow more room for the baby to fit through.  It is very important to breathe and focus so your health care provider can control the delivery of your baby's head. Your health care provider may have you decrease the intensity of your pushing, to help prevent perineal tearing.  After delivery of your baby's head, the shoulders and the rest of the body generally deliver very quickly and without difficulty.  Once your baby is delivered, the umbilical cord may be cut right away, or this may be delayed for 1-2 minutes, depending on your baby's health. This may vary among health care providers, hospitals, and birth centers.  If you and your baby are healthy enough, your baby may be placed on your chest or abdomen to help maintain the baby's temperature  and to help you bond with each other. Some mothers and babies start breastfeeding at this time. Your health care team will dry your baby and help keep your baby warm during this time.  Your baby may need immediate care if he or she: ? Showed signs of distress during labor. ? Has a medical condition. ? Was born too early (prematurely). ? Had a bowel movement before birth  (meconium). ? Shows signs of difficulty transitioning from being inside the uterus to being outside of the uterus. If you are planning to breastfeed, your health care team will help you begin a feeding. Stage 3  The third stage of labor starts immediately after the birth of your baby and ends after you deliver the placenta. The placenta is an organ that develops during pregnancy to provide oxygen and nutrients to your baby in the womb.  Delivering the placenta may require some pushing, and you may have mild contractions. Breastfeeding can stimulate contractions to help you deliver the placenta.  After the placenta is delivered, your uterus should tighten (contract) and become firm. This helps to stop bleeding in your uterus. To help your uterus contract and to control bleeding, your health care provider may: ? Give you medicine by injection, through an IV tube, by mouth, or through your rectum (rectally). ? Massage your abdomen or perform a vaginal exam to remove any blood clots that are left in your uterus. ? Empty your bladder by placing a thin, flexible tube (catheter) into your bladder. ? Encourage you to breastfeed your baby. After labor is over, you and your baby will be monitored closely to ensure that you are both healthy until you are ready to go home. Your health care team will teach you how to care for yourself and your baby. This information is not intended to replace advice given to you by your health care provider. Make sure you discuss any questions you have with your health care provider. Document Released: 12/07/2007 Document Revised: 09/17/2015 Document Reviewed: 03/14/2015 Elsevier Interactive Patient Education  2018 [employer].

## 2018-01-10 ENCOUNTER — Other Ambulatory Visit: Payer: No Typology Code available for payment source

## 2018-01-10 ENCOUNTER — Telehealth: Payer: Self-pay | Admitting: *Deleted

## 2018-01-10 DIAGNOSIS — L299 Pruritus, unspecified: Secondary | ICD-10-CM

## 2018-01-10 NOTE — Telephone Encounter (Signed)
Patient stated she did not sleep last night due to severe itching of the palms of her hands and soles of her feet.  Has had some itching before but not this severe. Spoke with K[facility] and will get fasting labs in the am.  Pt verbalized understanding.  States she will come this afternoon since she ate last at 5am.

## 2018-01-11 ENCOUNTER — Telehealth: Payer: Self-pay | Admitting: Women's Health

## 2018-01-11 NOTE — Telephone Encounter (Signed)
Patient informed bile acids had not resulted so may be Monday before she gets the result.  Pt verbalized understanding.

## 2018-01-11 NOTE — Telephone Encounter (Signed)
Calling to get test results from yesterday

## 2018-01-12 LAB — BILE ACIDS, TOTAL: Bile Acids Total: 5.4 umol/L (ref 0.0–10.0)

## 2018-01-12 LAB — COMPREHENSIVE METABOLIC PANEL
A/G RATIO: 1.6 (ref 1.2–2.2)
ALK PHOS: 222 IU/L — AB (ref 39–117)
ALT: 15 IU/L (ref 0–32)
AST: 21 IU/L (ref 0–40)
Albumin: 3.6 g/dL (ref 3.5–5.5)
BUN/Creatinine Ratio: 9 (ref 9–23)
BUN: 5 mg/dL — AB (ref 6–20)
Bilirubin Total: 0.4 mg/dL (ref 0.0–1.2)
CHLORIDE: 104 mmol/L (ref 96–106)
CO2: 21 mmol/L (ref 20–29)
Calcium: 9 mg/dL (ref 8.7–10.2)
Creatinine, Ser: 0.54 mg/dL — ABNORMAL LOW (ref 0.57–1.00)
GFR calc non Af Amer: 133 mL/min/{1.73_m2} (ref >59)
GFR, EST AFRICAN AMERICAN: 154 mL/min/{1.73_m2} (ref >59)
GLUCOSE: 76 mg/dL (ref 65–99)
Globulin, Total: 2.2 g/dL (ref 1.5–4.5)
POTASSIUM: 3.7 mmol/L (ref 3.5–5.2)
Sodium: 139 mmol/L (ref 134–144)
Total Protein: 5.8 g/dL — ABNORMAL LOW (ref 6.0–8.5)

## 2018-01-14 ENCOUNTER — Encounter: Payer: Self-pay | Admitting: *Deleted

## 2018-01-17 ENCOUNTER — Ambulatory Visit (INDEPENDENT_AMBULATORY_CARE_PROVIDER_SITE_OTHER): Payer: No Typology Code available for payment source | Admitting: Obstetrics and Gynecology

## 2018-01-17 VITALS — BP 114/64 | HR 104 | Wt 169.0 lb

## 2018-01-17 DIAGNOSIS — Z331 Pregnant state, incidental: Secondary | ICD-10-CM

## 2018-01-17 DIAGNOSIS — O403XX0 Polyhydramnios, third trimester, not applicable or unspecified: Secondary | ICD-10-CM

## 2018-01-17 DIAGNOSIS — O3663X0 Maternal care for excessive fetal growth, third trimester, not applicable or unspecified: Secondary | ICD-10-CM

## 2018-01-17 DIAGNOSIS — O0993 Supervision of high risk pregnancy, unspecified, third trimester: Secondary | ICD-10-CM

## 2018-01-17 DIAGNOSIS — Z1389 Encounter for screening for other disorder: Secondary | ICD-10-CM

## 2018-01-17 DIAGNOSIS — Z3A38 38 weeks gestation of pregnancy: Secondary | ICD-10-CM

## 2018-01-17 LAB — POCT URINALYSIS DIPSTICK OB
Glucose, UA: NEGATIVE
KETONES UA: NEGATIVE
Leukocytes, UA: NEGATIVE
Nitrite, UA: NEGATIVE
POC,PROTEIN,UA: NEGATIVE
RBC UA: NEGATIVE

## 2018-01-17 NOTE — Progress Notes (Signed)
Patient ID: FUSAE FLORIO, female   DOB: 1995/08/02, 22 y.o.   MRN: [id number]    [facility] PREGNANCY VISIT Patient name: Maria Cooper MRN [id number]  Date of birth: 1995-05-29 Chief Complaint:   Routine Prenatal Visit  History of Present Illness:   Maria Cooper is a 22 y.o. G1P0000 female at 61w2dwith an Estimated Date of Delivery: 01/29/18 being seen today for ongoing management of a high-risk pregnancy complicated by mild poly, and LGA infant. Normal 28 wk GTT Today she reports SOB that started yesterday. Pt has done childbirth classes and the father of the baby accompanied her to the appointment.   Contractions: Irregular. Vag. Bleeding: None.  Movement: Present. denies leaking of fluid.  Review of Systems:   Pertinent items are noted in HPI Denies abnormal vaginal discharge w/ itching/odor/irritation, headaches, visual changes, shortness of breath, chest pain, abdominal pain, severe nausea/vomiting, or problems with urination or bowel movements unless otherwise stated above. Pertinent History Reviewed:  Reviewed past medical,surgical, social, obstetrical and family history.  Reviewed problem list, medications and allergies. Physical Assessment:   Vitals:   01/17/18 1554  BP: 114/64  Pulse: (!) 104  Weight: 169 lb (76.7 kg)  Body mass index is 27.28 kg/m.           Physical Examination:   General appearance: alert, well appearing, and in no distress and oriented to person, place, and time  Mental status: alert, oriented to person, place, and time, normal mood, behavior, speech, dress, motor activity, and thought processes, affect appropriate to mood  Skin: warm & dry   Extremities: Edema: None    Cardiovascular: normal heart rate noted  Respiratory: normal respiratory effort, no distress  Abdomen: gravid, soft, non-tender  Pelvic: Cervical exam performed   Cervix: posterior , vertex low in pelvis.        Fetal Status: Fetal Heart Rate (bpm): 135 Fundal Height:  42 cm Movement: Present Presentation: Vertex  Fetal Surveillance Testing today: none  Results for orders placed or performed in visit on 01/17/18 (from the past 24 hour(s))  POC Urinalysis Dipstick OB   Collection Time: 01/17/18  3:59 PM  Result Value Ref Range   Color, UA     Clarity, UA     Glucose, UA Negative Negative   Bilirubin, UA     Ketones, UA neg    Spec Grav, UA     Blood, UA neg    pH, UA     POC,PROTEIN,UA Negative Negative, Trace   Urobilinogen, UA     Nitrite, UA neg    Leukocytes, UA Negative Negative   Appearance     Odor      Assessment & Plan:  1) High-risk pregnancy G1P0000 at 377w2dith an Estimated Date of Delivery: 01/29/18   2. Polyhydramnios in third trimester complication, single or unspecified fetus,   3. Fetal macrosomia in third trimester, single or unspecified fetus EFW > 99 %  Meds: No orders of the defined types were placed in this encounter.  Labs/procedures today: Doppler  Treatment Plan: IOL  01/29/2018 if pt's SOB increases, may consider earlier IOL.  Reviewed:    Follow-up: Return in about 1 week (around 01/24/2018) for [place] Orders Placed This Encounter  Procedures  . POC Urinalysis Dipstick OB   By signing my name below, I, EmDe Burrsattest that this documentation has been prepared under the direction and in the presence of FeJonnie KindMD. Electronically Signed: EmDe BurrsMedical Scribe. 01/17/18.  4:25 PM.  I personally performed the services described in this documentation, which was SCRIBED in my presence. The recorded information has been reviewed and considered accurate. It has been edited as necessary during review. Jonnie Kind, MD

## 2018-01-21 ENCOUNTER — Telehealth: Payer: Self-pay | Admitting: *Deleted

## 2018-01-21 ENCOUNTER — Encounter (HOSPITAL_COMMUNITY): Payer: Self-pay | Admitting: *Deleted

## 2018-01-21 ENCOUNTER — Ambulatory Visit (INDEPENDENT_AMBULATORY_CARE_PROVIDER_SITE_OTHER): Payer: No Typology Code available for payment source | Admitting: Family Medicine

## 2018-01-21 ENCOUNTER — Telehealth (HOSPITAL_COMMUNITY): Payer: Self-pay | Admitting: *Deleted

## 2018-01-21 VITALS — BP 115/70 | HR 111 | Wt 167.2 lb

## 2018-01-21 DIAGNOSIS — Z3A38 38 weeks gestation of pregnancy: Secondary | ICD-10-CM

## 2018-01-21 DIAGNOSIS — Z331 Pregnant state, incidental: Secondary | ICD-10-CM

## 2018-01-21 DIAGNOSIS — O3663X0 Maternal care for excessive fetal growth, third trimester, not applicable or unspecified: Secondary | ICD-10-CM

## 2018-01-21 DIAGNOSIS — O403XX0 Polyhydramnios, third trimester, not applicable or unspecified: Secondary | ICD-10-CM

## 2018-01-21 DIAGNOSIS — O0993 Supervision of high risk pregnancy, unspecified, third trimester: Secondary | ICD-10-CM

## 2018-01-21 DIAGNOSIS — Z1389 Encounter for screening for other disorder: Secondary | ICD-10-CM

## 2018-01-21 DIAGNOSIS — Z3480 Encounter for supervision of other normal pregnancy, unspecified trimester: Secondary | ICD-10-CM

## 2018-01-21 LAB — POCT URINALYSIS DIPSTICK OB
GLUCOSE, UA: NEGATIVE
Ketones, UA: NEGATIVE
LEUKOCYTES UA: NEGATIVE
Nitrite, UA: NEGATIVE
POC,PROTEIN,UA: NEGATIVE
RBC UA: NEGATIVE

## 2018-01-21 NOTE — Telephone Encounter (Signed)
Preadmission screen  

## 2018-01-21 NOTE — Telephone Encounter (Signed)
Pt called and stated that baby is not moving much today. She is also having chest pain and shortness of breath. Manus Gunning had told her to call if she developed these symptoms.  Advised patient that we will work her in to be seen today.

## 2018-01-21 NOTE — Progress Notes (Signed)
Patient ID: Maria Cooper, female   DOB: 04-13-1995, 22 y.o.   MRN: [id number]    [facility] PREGNANCY VISIT Patient name: Maria Cooper MRN [id number]  Date of birth: 1995/08/02 Chief Complaint:   Routine Prenatal Visit  History of Present Illness:   Maria Cooper is a 22 y.o. G1P0000 female at 21w2dwith an Estimated Date of Delivery: 01/29/18 being seen today for ongoing management of a high-risk pregnancy complicated by mild poly, and LGA infant. Normal 28 wk GTT Today she reports SOB that started yesterday. Pt has done childbirth classes and the father of the baby accompanied her to the appointment.   Contractions: Irregular. Vag. Bleeding: None.  Movement: (!) Decreased. denies leaking of fluid.  Review of Systems:   Pertinent items are noted in HPI Denies abnormal vaginal discharge w/ itching/odor/irritation, headaches, visual changes, shortness of breath, chest pain, abdominal pain, severe nausea/vomiting, or problems with urination or bowel movements unless otherwise stated above. Pertinent History Reviewed:  Reviewed past medical,surgical, social, obstetrical and family history.  Reviewed problem list, medications and allergies. Physical Assessment:   Vitals:   01/21/18 1332  BP: 115/70  Pulse: (!) 111  Weight: 167 lb 3.2 oz (75.8 kg)  Body mass index is 26.99 kg/m.           Physical Examination:   General appearance: alert, well appearing, and in no distress and oriented to person, place, and time  Mental status: alert, oriented to person, place, and time, normal mood, behavior, speech, dress, motor activity, and thought processes, affect appropriate to mood  Skin: warm & dry   Extremities: Edema: None    Cardiovascular: normal heart rate noted  Respiratory: normal respiratory effort, no distress  Abdomen: gravid, soft, non-tender  Pelvic: Cervical exam performed   Cervix: posterior , vertex low in pelvis.        Fetal Status:     Movement: (!) Decreased      Fetal Surveillance Testing today: none  Results for orders placed or performed in visit on 01/21/18 (from the past 24 hour(s))  POC Urinalysis Dipstick OB   Collection Time: 01/21/18  1:41 PM  Result Value Ref Range   Color, UA     Clarity, UA     Glucose, UA Negative Negative   Bilirubin, UA     Ketones, UA neg    Spec Grav, UA     Blood, UA neg    pH, UA     POC,PROTEIN,UA Negative Negative, Trace   Urobilinogen, UA     Nitrite, UA neg    Leukocytes, UA Negative Negative   Appearance     Odor      Assessment & Plan:  1) High-risk pregnancy G1P0000 at 237w2dith an Estimated Date of Delivery: 01/29/18   2. Polyhydramnios in third trimester complication, single or unspecified fetus,   3. Fetal macrosomia in third trimester, single or unspecified fetus EFW > 99 %  Meds: No orders of the defined types were placed in this encounter.  Labs/procedures today: NST  Treatment Plan: IOL 01/22/2018  Reviewed: NST- reactive and reassuring  Follow-up: No follow-ups on file.  Orders Placed This Encounter  Procedures  . POC Urinalysis Dipstick OB  KiCaren MacadamMD, MPH, ABFM Attending [facility]

## 2018-01-22 ENCOUNTER — Encounter (HOSPITAL_COMMUNITY): Payer: Self-pay

## 2018-01-22 ENCOUNTER — Other Ambulatory Visit: Payer: Self-pay

## 2018-01-22 ENCOUNTER — Inpatient Hospital Stay (HOSPITAL_COMMUNITY)
Admission: RE | Admit: 2018-01-22 | Discharge: 2018-01-26 | DRG: 787 | Disposition: A | Discharge status: Home / Self Care | Payer: No Typology Code available for payment source | Attending: Obstetrics & Gynecology | Admitting: Obstetrics & Gynecology

## 2018-01-22 VITALS — BP 97/53 | HR 58 | Temp 97.7°F | Resp 18 | Ht 66.0 in | Wt 170.2 lb

## 2018-01-22 DIAGNOSIS — O26893 Other specified pregnancy related conditions, third trimester: Secondary | ICD-10-CM | POA: Diagnosis present

## 2018-01-22 DIAGNOSIS — O403XX0 Polyhydramnios, third trimester, not applicable or unspecified: Principal | ICD-10-CM | POA: Diagnosis present

## 2018-01-22 DIAGNOSIS — O3663X0 Maternal care for excessive fetal growth, third trimester, not applicable or unspecified: Secondary | ICD-10-CM | POA: Diagnosis present

## 2018-01-22 DIAGNOSIS — K900 Celiac disease: Secondary | ICD-10-CM | POA: Diagnosis present

## 2018-01-22 DIAGNOSIS — Z3A39 39 weeks gestation of pregnancy: Secondary | ICD-10-CM | POA: Diagnosis not present

## 2018-01-22 DIAGNOSIS — I871 Compression of vein: Secondary | ICD-10-CM | POA: Diagnosis present

## 2018-01-22 DIAGNOSIS — O9962 Diseases of the digestive system complicating childbirth: Secondary | ICD-10-CM | POA: Diagnosis present

## 2018-01-22 DIAGNOSIS — Z98891 History of uterine scar from previous surgery: Secondary | ICD-10-CM

## 2018-01-22 DIAGNOSIS — O324XX0 Maternal care for high head at term, not applicable or unspecified: Secondary | ICD-10-CM | POA: Diagnosis present

## 2018-01-22 DIAGNOSIS — O3660X0 Maternal care for excessive fetal growth, unspecified trimester, not applicable or unspecified: Secondary | ICD-10-CM | POA: Diagnosis present

## 2018-01-22 DIAGNOSIS — O0993 Supervision of high risk pregnancy, unspecified, third trimester: Secondary | ICD-10-CM

## 2018-01-22 DIAGNOSIS — O409XX0 Polyhydramnios, unspecified trimester, not applicable or unspecified: Secondary | ICD-10-CM | POA: Diagnosis present

## 2018-01-22 LAB — CBC
HCT: 38.3 % (ref 36.0–46.0)
Hemoglobin: 13 g/dL (ref 12.0–15.0)
MCH: 30.7 pg (ref 26.0–34.0)
MCHC: 33.9 g/dL (ref 30.0–36.0)
MCV: 90.3 fL (ref 80.0–100.0)
PLATELETS: 185 10*3/uL (ref 150–400)
RBC: 4.24 MIL/uL (ref 3.87–5.11)
RDW: 13.4 % (ref 11.5–15.5)
WBC: 14.8 10*3/uL — ABNORMAL HIGH (ref 4.0–10.5)
nRBC: 0 % (ref 0.0–0.2)

## 2018-01-22 LAB — TYPE AND SCREEN
ABO/RH(D): O POS
Antibody Screen: NEGATIVE

## 2018-01-22 LAB — ABO/RH: ABO/RH(D): O POS

## 2018-01-22 LAB — RPR: RPR: NONREACTIVE

## 2018-01-22 MED ORDER — ACETAMINOPHEN 325 MG PO TABS
650.0000 mg | ORAL_TABLET | ORAL | Status: DC | PRN
  Administered 2018-01-22: 650 mg via ORAL
  Filled 2018-01-22: qty 2

## 2018-01-22 MED ORDER — ONDANSETRON HCL 4 MG/2ML IJ SOLN: 4.0000 mg | Freq: Four times a day (QID) | INTRAMUSCULAR | Status: DC | PRN

## 2018-01-22 MED ORDER — OXYCODONE-ACETAMINOPHEN 5-325 MG PO TABS: 1.0000 | ORAL_TABLET | ORAL | Status: DC | PRN

## 2018-01-22 MED ORDER — OXYTOCIN 40 UNITS IN LACTATED RINGERS INFUSION - SIMPLE MED: 2.5000 [IU]/h | INTRAVENOUS | Status: DC

## 2018-01-22 MED ORDER — LACTATED RINGERS IV SOLN
INTRAVENOUS | Status: DC
  Administered 2018-01-22 – 2018-01-23 (×6): via INTRAVENOUS

## 2018-01-22 MED ORDER — FENTANYL CITRATE (PF) 100 MCG/2ML IJ SOLN
INTRAMUSCULAR | Status: AC
  Filled 2018-01-22: qty 2

## 2018-01-22 MED ORDER — FLEET ENEMA 7-19 GM/118ML RE ENEM: 1.0000 | ENEMA | RECTAL | Status: DC | PRN

## 2018-01-22 MED ORDER — LACTATED RINGERS IV SOLN
500.0000 mL | INTRAVENOUS | Status: DC | PRN
  Administered 2018-01-22 – 2018-01-23 (×2): 500 mL via INTRAVENOUS

## 2018-01-22 MED ORDER — LIDOCAINE HCL (PF) 1 % IJ SOLN: 30.0000 mL | INTRAMUSCULAR | Status: DC | PRN

## 2018-01-22 MED ORDER — FENTANYL CITRATE (PF) 100 MCG/2ML IJ SOLN
100.0000 ug | INTRAMUSCULAR | Status: DC | PRN
  Administered 2018-01-22 – 2018-01-23 (×2): 100 ug via INTRAVENOUS
  Filled 2018-01-22: qty 2

## 2018-01-22 MED ORDER — OXYTOCIN BOLUS FROM INFUSION: 500.0000 mL | Freq: Once | INTRAVENOUS | Status: DC

## 2018-01-22 MED ORDER — OXYCODONE-ACETAMINOPHEN 5-325 MG PO TABS: 2.0000 | ORAL_TABLET | ORAL | Status: DC | PRN

## 2018-01-22 MED ORDER — TERBUTALINE SULFATE 1 MG/ML IJ SOLN
0.2500 mg | Freq: Once | INTRAMUSCULAR | Status: AC | PRN
  Administered 2018-01-22: 0.25 mg via SUBCUTANEOUS
  Filled 2018-01-22: qty 1

## 2018-01-22 MED ORDER — SOD CITRATE-CITRIC ACID 500-334 MG/5ML PO SOLN
30.0000 mL | ORAL | Status: DC | PRN
  Administered 2018-01-23: 30 mL via ORAL
  Filled 2018-01-22: qty 15

## 2018-01-22 MED ORDER — MISOPROSTOL 25 MCG QUARTER TABLET
25.0000 ug | ORAL_TABLET | ORAL | Status: DC | PRN
  Administered 2018-01-22: 25 ug via VAGINAL
  Filled 2018-01-22: qty 1
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
## 2018-01-22 NOTE — Plan of Care (Signed)
  Problem: Education: Goal: Knowledge of Childbirth will improve Outcome: Progressing Goal: Ability to make informed decisions regarding treatment and plan of care will improve Outcome: Progressing Goal: Ability to state and carry out methods to decrease the pain will improve Outcome: Progressing   Problem: Coping: Goal: Ability to verbalize concerns and feelings about labor and delivery will improve Outcome: Progressing   Problem: Life Cycle: Goal: Ability to make normal progression through stages of labor will improve Outcome: Progressing Goal: Ability to effectively push during vaginal delivery will improve Outcome: Progressing   Problem: Role Relationship: Goal: Ability to demonstrate positive interaction with the child will improve Outcome: Progressing   Problem: Safety: Goal: Risk of complications during labor and delivery will decrease Outcome: Progressing   Problem: Pain Management: Goal: Relief or control of pain from uterine contractions will improve Outcome: Progressing

## 2018-01-22 NOTE — Progress Notes (Signed)
LABOR PROGRESS NOTE  Maria Cooper is a 22 y.o. G1P0000 at [redacted]w[redacted]d admitted for IOL for polyhydramnios  Subjective: Doing well. Tolerating pain well.   Objective: BP 98/68   Pulse 85   Temp 98 F (36.7 C) (Oral)   Resp 18   Ht 5' 6"  (1.676 m)   Wt 77.2 kg   LMP 04/24/2017   BMI 27.47 kg/m  or  Vitals:   01/22/18 1004 01/22/18 1104 01/22/18 1313 01/22/18 1452  BP: (!) 99/55 (!) 103/56 (!) 105/54 98/68  Pulse: 77 73 82 85  Resp: 16 20  18   Temp:  98.1 F (36.7 C)  98 F (36.7 C)  TempSrc:  Oral  Oral  Weight:      Height:        Dilation: 1 Effacement (%): 80 Cervical Position: Posterior Station: -3 Presentation: Vertex Exam by:: Maecy Podgurski FHT: baseline rate 135, moderate varibility, +acel, variable decel during cervical checks and FB placement  Toco: q2 min  Labs: Lab Results  Component Value Date   WBC 14.8 (H) 01/22/2018   HGB 13.0 01/22/2018   HCT 38.3 01/22/2018   MCV 90.3 01/22/2018   PLT 185 01/22/2018    Patient Active Problem List   Diagnosis Date Noted  . Supervision of other normal pregnancy, antepartum 01/09/2018  . Polyhydramnios 12/05/2017  . Fetal macrosomia 12/05/2017  . High-risk pregnancy 07/02/2017  . Celiac sprue 07/09/2014  . Esophageal reflux   . Dyspepsia 05/14/2014    Assessment / Plan: 22y.o. G1P0000 at 360w0dere for IOL for polyhydramnios   Labor: IOL, s/p cytotec x1, FB placed by Dr. StNehemiah Settlefter attempts by both me and Dr. WaJuleen Chinaetal Wellbeing:  Cat 1 Pain Control:  Per patient request, IV pain meds followed by epidural  Anticipated MOD:  SVD  ShCaroline MoreDO PGY-2 01/22/2018, 3:37 PM

## 2018-01-22 NOTE — Progress Notes (Signed)
Maria Cooper is a 22 y.o. G1P0000 at 32w0dby LMP admitted for induction of labor due to Hydramnios.  Subjective: Patient resting in bed, FOB at bedside.  Objective: BP (!) 103/56   Pulse 73   Temp 98.1 F (36.7 C) (Oral)   Resp 20   Ht 5' 6"  (1.676 m)   Wt 77.2 kg   LMP 04/24/2017   BMI 27.47 kg/m  No intake/output data recorded.  FHT:  FHR: 135 bpm, variability: moderate,  accelerations:  Present,  decelerations:  Absent UC:   regular, every 1-2 minutes SVE:   Dilation: Fingertip Effacement (%): Thick Station: Ballotable Exam by:: YLina SarRN   Labs: Lab Results  Component Value Date   WBC 14.8 (H) 01/22/2018   HGB 13.0 01/22/2018   HCT 38.3 01/22/2018   MCV 90.3 01/22/2018   PLT 185 01/22/2018    Assessment / Plan: IOL due to hydramnios, contracting regularly on cytotec.  Cervix has effaced and baby has moved down, but still too posterior for FB placement. Will continue to labor on cytotec, and reevaluate FB placement this afternoon.  Labor: Contracting regularly Preeclampsia:  no signs or symptoms of toxicity Fetal Wellbeing:  Category I Pain Control:  Labor support without medications, planning epidural I/D:  n/a Anticipated MOD:  NSVD  JGabriel Carina11/02/2018, 12:20 PM

## 2018-01-22 NOTE — Anesthesia Pain Management Evaluation Note (Signed)
  CRNA Pain Management Visit Note  Patient: Maria Cooper, 22 y.o., female  "Hello I am a member of the anesthesia team at [facility]. We have an anesthesia team available at all times to provide care throughout the hospital, including epidural management and anesthesia for C-section. I don't know your plan for the delivery whether it a natural birth, water birth, IV sedation, nitrous supplementation, doula or epidural, but we want to meet your pain goals."   1.Was your pain managed to your expectations on prior hospitalizations?   No prior hospitalizations  2.What is your expectation for pain management during this hospitalization?     Epidural and IV pain meds  3.How can we help you reach that goal? epidural  Record the patient's initial score and the patient's pain goal.   Pain: 0  Pain Goal: 5 [facility] wants you to be able to say your pain was always managed very well.  Infinity Jeffords 01/22/2018

## 2018-01-22 NOTE — Progress Notes (Signed)
LABOR PROGRESS NOTE  Maria Cooper is a 22 y.o. G1P0000 at [redacted]w[redacted]d admitted for IOL for polyhydramnios  Subjective: Doing well. Tolerating pain well. FOB at bedside    Objective: BP (!) 108/59 (BP Location: Left Arm)   Pulse 97   Temp 98.2 F (36.8 C) (Oral)   Resp 18   Ht 5' 6"  (1.676 m)   Wt 77.2 kg   LMP 04/24/2017   BMI 27.47 kg/m  or  Vitals:   01/22/18 1603 01/22/18 1748 01/22/18 1903 01/22/18 1916  BP: 112/69 (!) 106/42 (!) 110/53 (!) 108/59  Pulse: 71 96 95 97  Resp: 18 18 18 18   Temp:   98 F (36.7 C) 98.2 F (36.8 C)  TempSrc:   Oral Oral  Weight:      Height:        Dilation: 1 Effacement (%): 80 Cervical Position: Posterior Station: -3 Presentation: Vertex Exam by:: Marquiz Sotelo FHT: baseline rate 140, moderate varibility, +acel, -decel Toco: q1-4 min  Labs: Lab Results  Component Value Date   WBC 14.8 (H) 01/22/2018   HGB 13.0 01/22/2018   HCT 38.3 01/22/2018   MCV 90.3 01/22/2018   PLT 185 01/22/2018    Patient Active Problem List   Diagnosis Date Noted  . Supervision of other normal pregnancy, antepartum 01/09/2018  . Polyhydramnios 12/05/2017  . Fetal macrosomia 12/05/2017  . High-risk pregnancy 07/02/2017  . Celiac sprue 07/09/2014  . Esophageal reflux   . Dyspepsia 05/14/2014    Assessment / Plan: 22y.o. G1P0000 at 316w0dere for IOL for polyhydramnios   Labor: IOL, s/p cytotec x1, FB still in place, terb given earlier in day as contractions were very frequent and FHT became Cat 2 Fetal Wellbeing:  Cat 1 Pain Control:  Per patient request, IV pain meds followed by epidural  Anticipated MOD:  SVD  ShCaroline MoreDO PGY-2 01/22/2018, 7:51 PM

## 2018-01-23 ENCOUNTER — Inpatient Hospital Stay (HOSPITAL_COMMUNITY): Payer: No Typology Code available for payment source | Admitting: Anesthesiology

## 2018-01-23 ENCOUNTER — Encounter: Payer: No Typology Code available for payment source | Admitting: Advanced Practice Midwife

## 2018-01-23 ENCOUNTER — Encounter (HOSPITAL_COMMUNITY)
Admission: RE | Disposition: A | Discharge status: Home / Self Care | Payer: Self-pay | Source: Home / Self Care | Attending: Obstetrics & Gynecology

## 2018-01-23 SURGERY — Surgical Case
Anesthesia: Regional

## 2018-01-23 MED ORDER — DIPHENHYDRAMINE HCL 50 MG/ML IJ SOLN: 12.5000 mg | INTRAMUSCULAR | Status: DC | PRN

## 2018-01-23 MED ORDER — FENTANYL 2.5 MCG/ML BUPIVACAINE 1/10 % EPIDURAL INFUSION (WH - ANES)
14.0000 mL/h | INTRAMUSCULAR | Status: DC | PRN
  Administered 2018-01-23 (×3): 14 mL/h via EPIDURAL
  Filled 2018-01-23 (×3): qty 100

## 2018-01-23 MED ORDER — LIDOCAINE HCL (PF) 1 % IJ SOLN
INTRAMUSCULAR | Status: DC | PRN
  Administered 2018-01-23: 8 mL via EPIDURAL

## 2018-01-23 MED ORDER — OXYTOCIN 40 UNITS IN LACTATED RINGERS INFUSION - SIMPLE MED
1.0000 m[IU]/min | INTRAVENOUS | Status: DC
  Administered 2018-01-23: 2 m[IU]/min via INTRAVENOUS
  Administered 2018-01-23: 12 m[IU]/min via INTRAVENOUS
  Administered 2018-01-23: 10 m[IU]/min via INTRAVENOUS
  Filled 2018-01-23: qty 1000

## 2018-01-23 MED ORDER — SCOPOLAMINE 1 MG/3DAYS TD PT72
MEDICATED_PATCH | TRANSDERMAL | Status: DC | PRN
  Administered 2018-01-23: 1 via TRANSDERMAL

## 2018-01-23 MED ORDER — ONDANSETRON HCL 4 MG/2ML IJ SOLN
INTRAMUSCULAR | Status: DC | PRN
  Administered 2018-01-23: 4 mg via INTRAVENOUS

## 2018-01-23 MED ORDER — CEFAZOLIN SODIUM-DEXTROSE 2-3 GM-%(50ML) IV SOLR
INTRAVENOUS | Status: DC | PRN
  Administered 2018-01-23: 2 g via INTRAVENOUS

## 2018-01-23 MED ORDER — DEXAMETHASONE SODIUM PHOSPHATE 4 MG/ML IJ SOLN
INTRAMUSCULAR | Status: DC | PRN
  Administered 2018-01-23: 4 mg via INTRAVENOUS

## 2018-01-23 MED ORDER — SCOPOLAMINE 1 MG/3DAYS TD PT72
MEDICATED_PATCH | TRANSDERMAL | Status: AC
  Filled 2018-01-23: qty 1

## 2018-01-23 MED ORDER — LACTATED RINGERS IV SOLN
500.0000 mL | Freq: Once | INTRAVENOUS | Status: AC
  Administered 2018-01-23: 500 mL via INTRAVENOUS

## 2018-01-23 MED ORDER — EPHEDRINE 5 MG/ML INJ: 10.0000 mg | INTRAVENOUS | Status: DC | PRN

## 2018-01-23 MED ORDER — DEXAMETHASONE SODIUM PHOSPHATE 4 MG/ML IJ SOLN
INTRAMUSCULAR | Status: AC
  Filled 2018-01-23: qty 1

## 2018-01-23 MED ORDER — PHENYLEPHRINE 40 MCG/ML (10ML) SYRINGE FOR IV PUSH (FOR BLOOD PRESSURE SUPPORT)
80.0000 ug | PREFILLED_SYRINGE | INTRAVENOUS | Status: DC | PRN
  Filled 2018-01-23: qty 10

## 2018-01-23 MED ORDER — ONDANSETRON HCL 4 MG/2ML IJ SOLN
INTRAMUSCULAR | Status: AC
  Filled 2018-01-23: qty 2

## 2018-01-23 MED ORDER — SODIUM BICARBONATE 8.4 % IV SOLN
INTRAVENOUS | Status: DC | PRN
  Administered 2018-01-23: 3 mL via EPIDURAL
  Administered 2018-01-23: 5 mL via EPIDURAL
  Administered 2018-01-23: 2 mL via EPIDURAL
  Administered 2018-01-23 – 2018-01-24 (×3): 5 mL via EPIDURAL

## 2018-01-23 MED ORDER — OXYTOCIN 10 UNIT/ML IJ SOLN
INTRAMUSCULAR | Status: AC
  Filled 2018-01-23: qty 4

## 2018-01-23 MED ORDER — PHENYLEPHRINE 40 MCG/ML (10ML) SYRINGE FOR IV PUSH (FOR BLOOD PRESSURE SUPPORT)
80.0000 ug | PREFILLED_SYRINGE | INTRAVENOUS | Status: DC | PRN
  Administered 2018-01-23: 80 ug via INTRAVENOUS

## 2018-01-23 MED ORDER — TERBUTALINE SULFATE 1 MG/ML IJ SOLN: 0.2500 mg | Freq: Once | INTRAMUSCULAR | Status: DC | PRN

## 2018-01-23 MED ORDER — LIDOCAINE-EPINEPHRINE (PF) 2 %-1:200000 IJ SOLN
INTRAMUSCULAR | Status: AC
  Filled 2018-01-23: qty 20

## 2018-01-23 MED ORDER — LACTATED RINGERS IV SOLN
INTRAVENOUS | Status: DC | PRN
  Administered 2018-01-23 – 2018-01-24 (×3): via INTRAVENOUS

## 2018-01-23 SURGICAL SUPPLY — 38 items
CHLORAPREP W/TINT 26ML (MISCELLANEOUS) ×2 IMPLANT
CLAMP CORD UMBIL (MISCELLANEOUS) IMPLANT
CLOTH BEACON ORANGE TIMEOUT ST (SAFETY) ×2 IMPLANT
DERMABOND ADVANCED (GAUZE/BANDAGES/DRESSINGS) ×1
DERMABOND ADVANCED .7 DNX12 (GAUZE/BANDAGES/DRESSINGS) ×1 IMPLANT
DRSG OPSITE POSTOP 4X10 (GAUZE/BANDAGES/DRESSINGS) ×2 IMPLANT
ELECT REM PT RETURN 9FT ADLT (ELECTROSURGICAL) ×2
ELECTRODE REM PT RTRN 9FT ADLT (ELECTROSURGICAL) ×1 IMPLANT
EXTRACTOR VACUUM BELL STYLE (SUCTIONS) IMPLANT
GLOVE BIOGEL PI IND STRL 7.0 (GLOVE) ×1 IMPLANT
GLOVE BIOGEL PI IND STRL 8 (GLOVE) ×1 IMPLANT
GLOVE BIOGEL PI INDICATOR 7.0 (GLOVE) ×1
GLOVE BIOGEL PI INDICATOR 8 (GLOVE) ×1
GLOVE ECLIPSE 8.0 STRL XLNG CF (GLOVE) ×2 IMPLANT
GOWN STRL REUS W/TWL LRG LVL3 (GOWN DISPOSABLE) ×4 IMPLANT
KIT ABG SYR 3ML LUER SLIP (SYRINGE) ×2 IMPLANT
NEEDLE HYPO 18GX1.5 BLUNT FILL (NEEDLE) ×2 IMPLANT
NEEDLE HYPO 22GX1.5 SAFETY (NEEDLE) ×2 IMPLANT
NEEDLE HYPO 25X5/8 SAFETYGLIDE (NEEDLE) ×4 IMPLANT
NS IRRIG 1000ML POUR BTL (IV SOLUTION) ×2 IMPLANT
PACK C SECTION WH (CUSTOM PROCEDURE TRAY) ×2 IMPLANT
PAD OB MATERNITY 4.3X12.25 (PERSONAL CARE ITEMS) ×2 IMPLANT
PENCIL SMOKE EVAC W/HOLSTER (ELECTROSURGICAL) ×2 IMPLANT
RTRCTR C-SECT PINK 25CM LRG (MISCELLANEOUS) IMPLANT
SPONGE LAP 18X18 RF (DISPOSABLE) ×6 IMPLANT
SUT CHROMIC 0 CT 1 (SUTURE) ×2 IMPLANT
SUT MNCRL 0 VIOLET CTX 36 (SUTURE) ×2 IMPLANT
SUT MONOCRYL 0 CTX 36 (SUTURE) ×2
SUT PLAIN 2 0 (SUTURE)
SUT PLAIN 2 0 XLH (SUTURE) IMPLANT
SUT PLAIN ABS 2-0 CT1 27XMFL (SUTURE) IMPLANT
SUT VIC AB 0 CTX 36 (SUTURE) ×1
SUT VIC AB 0 CTX36XBRD ANBCTRL (SUTURE) ×1 IMPLANT
SUT VIC AB 4-0 KS 27 (SUTURE) IMPLANT
SYR 20CC LL (SYRINGE) ×4 IMPLANT
TOWEL OR 17X24 6PK STRL BLUE (TOWEL DISPOSABLE) ×2 IMPLANT
TRAY FOLEY W/BAG SLVR 14FR LF (SET/KITS/TRAYS/PACK) IMPLANT
WATER STERILE IRR 1000ML POUR (IV SOLUTION) ×2 IMPLANT

## 2018-01-23 NOTE — Progress Notes (Signed)
LABOR PROGRESS NOTE  Maria Cooper is a 22 y.o. G1P0000 at [redacted]w[redacted]d admitted for IOL for polyhydramnios  Subjective: Doing well, tolerating pain well. Slightly nervous for delivery, but otherwise doing well. SROM with clear fluid  Objective: BP 99/85   Pulse 74   Temp 98.5 F (36.9 C) (Oral)   Resp 18   Ht 5' 6"  (1.676 m)   Wt 77.2 kg   LMP 04/24/2017   SpO2 99%   BMI 27.47 kg/m  or  Vitals:   01/23/18 1201 01/23/18 1231 01/23/18 1301 01/23/18 1331  BP: 100/61 (!) 104/58 108/62 99/85  Pulse: 70 79 80 74  Resp:   18   Temp:    98.5 F (36.9 C)  TempSrc:    Oral  SpO2:      Weight:      Height:        Dilation: 7.5 Effacement (%): 90 Cervical Position: Middle Station: 0 Presentation: Vertex Exam by:: sowder FHT: baseline rate 135, moderate varibility, +acel, occasional late decel but improve with positional change  Toco: q34m  Labs: Lab Results  Component Value Date   WBC 14.8 (H) 01/22/2018   HGB 13.0 01/22/2018   HCT 38.3 01/22/2018   MCV 90.3 01/22/2018   PLT 185 01/22/2018    Patient Active Problem List   Diagnosis Date Noted  . Supervision of other normal pregnancy, antepartum 01/09/2018  . Polyhydramnios 12/05/2017  . Fetal macrosomia 12/05/2017  . High-risk pregnancy 07/02/2017  . Celiac sprue 07/09/2014  . Esophageal reflux   . Dyspepsia 05/14/2014    Assessment / Plan: [age].o. G1P0000 at 3953w1dre for IOL for polyhydramnios  Labor: IOL, continue Pitocin (8mi76mnits/min). S/p SROM Fetal Wellbeing:  Cat 1 Pain Control:  Epidural  Anticipated MOD:  SVD  SherCaroline More PGY-2 01/23/2018, 1:55 PM

## 2018-01-23 NOTE — Progress Notes (Signed)
[facility]N [facility]: Labor Progress Note  Subjective: Much more comfortable since getting epidural. No longer feeling contractions. Mild back pressure.   Objective: BP (!) 90/44   Pulse 73   Temp 98.2 F (36.8 C) (Oral)   Resp 18   Ht 5' 6"  (1.676 m)   Wt 77.2 kg   LMP 04/24/2017   BMI 27.47 kg/m  Gen: no distress Dilation: 5 Effacement (%): 80 Cervical Position: Posterior Station: Ballotable Presentation: Vertex Exam by:: Jerrell Mylar, RN  Assessment and Plan: 22 y.o. G1P0000 30w1dadmitted for IOL 2/2 polyhydramnios  Labor: s/p cytotec x1, FB in place -- pain control: IV fentanyl, wants an epidural  Fetal Well-Being: EFW 99th%tile. Cephalic by prior exam -- Category 1 - continuous fetal monitoring  -- GBS negative  Leg pain/numbness: likely 2/2 to nerve and venous compression as baby descends -- low concern for DVT as Homan's is negative and the left leg is not erythematous or warm  -- patient has been repositioned to her side    JOrlene Plum MD Family Medicine Resident 5:25 AM

## 2018-01-23 NOTE — Anesthesia Preprocedure Evaluation (Addendum)
Anesthesia Evaluation  Patient identified by MRN, date of birth, ID band Patient awake    Reviewed: Allergy & Precautions, H&P , NPO status , Patient's Chart, lab work & pertinent test results, reviewed documented beta blocker date and time   Airway Mallampati: II  TM Distance: >3 FB Neck ROM: full    Dental no notable dental hx.    Pulmonary neg pulmonary ROS,    Pulmonary exam normal breath sounds clear to auscultation       Cardiovascular negative cardio ROS Normal cardiovascular exam Rhythm:regular Rate:Normal     Neuro/Psych negative neurological ROS  negative psych ROS   GI/Hepatic negative GI ROS, Neg liver ROS,   Endo/Other  negative endocrine ROS  Renal/GU negative Renal ROS  negative genitourinary   Musculoskeletal   Abdominal   Peds  Hematology negative hematology ROS (+)   Anesthesia Other Findings   Reproductive/Obstetrics (+) Pregnancy                             Anesthesia Physical Anesthesia Plan  ASA: II  Anesthesia Plan: Epidural   Post-op Pain Management:    Induction:   PONV Risk Score and Plan: 4 or greater and Scopolamine patch - Pre-op, Ondansetron, Dexamethasone and Treatment may vary due to age or medical condition  Airway Management Planned: Natural Airway  Additional Equipment:   Intra-op Plan:   Post-operative Plan:   Informed Consent: I have reviewed the patients History and Physical, chart, labs and discussed the procedure including the risks, benefits and alternatives for the proposed anesthesia with the patient or authorized representative who has indicated his/her understanding and acceptance.   Dental Advisory Given and Dental advisory given  Plan Discussed with: CRNA, Anesthesiologist and Surgeon  Anesthesia Plan Comments: (Labs checked- platelets confirmed with RN in room. Fetal heart tracing, per RN, reported to be stable enough for  sitting procedure. Discussed epidural, and patient consents to the procedure:  included risk of possible headache,backache, failed block, allergic reaction, and nerve injury. This patient was asked if she had any questions or concerns before the procedure started.  Patient for C/section for arrest of descent. Will use Epidural for C/section. M. Royce Macadamia, MD)       Anesthesia Quick Evaluation

## 2018-01-23 NOTE — Progress Notes (Signed)
Patient ID: CARIANNE TAIRA, female   DOB: 1996-01-18, 22 y.o.   MRN: [id number]  Pushed from 1700-2030. Feeling exhausted now.  BP 103/48, P 92 FHR 130s, +accels, no decels, occ early variables Ctx q 3 mins w/ Pit at 56m/min Pelvic: caput at +2, bones higher and haven't rounded the pubis yet  IUP@term  Polyhydramnios LGA End 1st stage and pushing  Options given by Dr EElonda Husky continue pushing vs pLTCS, second in line; pt and family elect for C/S delivery; will stop Pitocin and keep comfortable for now.   SSerita GrammesCNM 01/23/2018 9:23 PM

## 2018-01-23 NOTE — Anesthesia Procedure Notes (Signed)
Epidural Patient location during procedure: OB Start time: 01/23/2018 2:00 AM End time: 01/23/2018 2:05 AM  Staffing Anesthesiologist: Janeece Riggers, MD  Preanesthetic Checklist Completed: patient identified, site marked, surgical consent, pre-op evaluation, timeout performed, IV checked, risks and benefits discussed and monitors and equipment checked  Epidural Patient position: sitting Prep: site prepped and draped and DuraPrep Patient monitoring: continuous pulse ox and blood pressure Approach: midline Location: L4-L5 Injection technique: LOR air  Needle:  Needle type: Tuohy  Needle gauge: 17 G Needle length: 9 cm and 9 Needle insertion depth: 5 cm cm Catheter type: closed end flexible Catheter size: 19 Gauge Catheter at skin depth: 10 cm Test dose: negative  Assessment Events: blood not aspirated, injection not painful, no injection resistance, negative IV test and no paresthesia

## 2018-01-23 NOTE — Progress Notes (Signed)
LABOR PROGRESS NOTE  Maria Cooper is a 22 y.o. G1P0000 at [redacted]w[redacted]d admitted for IOL for polyhydramnios  Subjective: Doing well, tolerating pain well. States pain is significantly improved with epidural.   Objective: BP (!) 105/59   Pulse 68   Temp 98.4 F (36.9 C) (Oral)   Resp (P) 18   Ht 5' 6"  (1.676 m)   Wt 77.2 kg   LMP 04/24/2017   SpO2 99%   BMI 27.47 kg/m  or  Vitals:   01/23/18 0831 01/23/18 0901 01/23/18 0931 01/23/18 1001  BP: 109/63 107/60 (!) 103/57 (!) 105/59  Pulse: 96 74 68 68  Resp:   20 (P) 18  Temp:      TempSrc:      SpO2:      Weight:      Height:        Dilation: (P) 6 Effacement (%): (P) 80 Cervical Position: (P) Middle Station: (P) -2 Presentation: (P) Vertex Exam by:: (P) Kimberlye Dilger FHT: baseline rate 135, moderate varibility, +acel, occasional late decel but improve with positional change  Toco: q2-310m  Labs: Lab Results  Component Value Date   WBC 14.8 (H) 01/22/2018   HGB 13.0 01/22/2018   HCT 38.3 01/22/2018   MCV 90.3 01/22/2018   PLT 185 01/22/2018    Patient Active Problem List   Diagnosis Date Noted  . Supervision of other normal pregnancy, antepartum 01/09/2018  . Polyhydramnios 12/05/2017  . Fetal macrosomia 12/05/2017  . High-risk pregnancy 07/02/2017  . Celiac sprue 07/09/2014  . Esophageal reflux   . Dyspepsia 05/14/2014    Assessment / Plan: [age].o. G1P0000 at 3944w1dre for IOL for polyhydramnios  Labor: IOL, continue Pitocin (8mi88mnits/min). Can consider AROM once fetus is well applied  Fetal Wellbeing:  Cat 1 Pain Control:  Epidural  Anticipated MOD:  SVD  SherCaroline More PGY-2 01/23/2018, 10:37 AM

## 2018-01-23 NOTE — Progress Notes (Signed)
LABOR PROGRESS NOTE  Maria Cooper is a 22 y.o. G1P0000 at [redacted]w[redacted]d admitted for IOL for polyhydramnios  Subjective: Very excited about cervical change. Hopeful for delivery soon.   Objective: BP 117/72   Pulse 83   Temp 97.6 F (36.4 C) (Oral)   Resp 18   Ht 5' 6"  (1.676 m)   Wt 77.2 kg   LMP 04/24/2017   SpO2 99%   BMI 27.47 kg/m  or  Vitals:   01/23/18 1544 01/23/18 1546 01/23/18 1551 01/23/18 1556  BP: 117/69 122/70 117/67 117/72  Pulse: 82 88 75 83  Resp:   18   Temp:      TempSrc:      SpO2:      Weight:      Height:        Dilation: 8.5 Effacement (%): 100 Cervical Position: Middle Station: 0 Presentation: Vertex Exam by:: sowder FHT: baseline rate 125, moderate varibility, +acel, +early decel   Toco: q335m  Labs: Lab Results  Component Value Date   WBC 14.8 (H) 01/22/2018   HGB 13.0 01/22/2018   HCT 38.3 01/22/2018   MCV 90.3 01/22/2018   PLT 185 01/22/2018    Patient Active Problem List   Diagnosis Date Noted  . Supervision of other normal pregnancy, antepartum 01/09/2018  . Polyhydramnios 12/05/2017  . Fetal macrosomia 12/05/2017  . High-risk pregnancy 07/02/2017  . Celiac sprue 07/09/2014  . Esophageal reflux   . Dyspepsia 05/14/2014    Assessment / Plan: [age].o. G1P0000 at 3997w1dre for IOL for polyhydramnios  Labor: IOL, continue Pitocin (8mi90mnits/min)  Fetal Wellbeing:  Cat 1 Pain Control:  Epidural  Anticipated MOD:  SVD  SherCaroline More PGY-2 01/23/2018, 4:01 PM

## 2018-01-23 NOTE — Progress Notes (Signed)
OB/GYN Faculty Practice: Labor Progress Note  Subjective: Starting to have uncomfortable contractions, relief with IV fentanyl. FOB at bedside. Also having L lateral leg pain and numbness. Has not repositioned recently   Objective: BP 98/64   Pulse 85   Temp 98.2 F (36.8 C) (Oral)   Resp 17   Ht 5' 6"  (1.676 m)   Wt 77.2 kg   LMP 04/24/2017   BMI 27.47 kg/m  Gen: mild distress Ext: L left moderately edematous distal to the knee compared to the R, no pitting edema, no warmth, normal strength, decreased sensation to light touch of the L lateral thigh.  Dilation: 1 Effacement (%): 80 Cervical Position: Posterior Station: -3 Presentation: Vertex Exam by:: Kymberlyn Eckford  Assessment and Plan: 22 y.o. G1P0000 37w1dadmitted for IOL 2/2 polyhydramnios  Labor: s/p cytotec x1, FB in place -- pain control: IV fentanyl, wants an epidural  Fetal Well-Being: EFW 99th%tile. Cephalic by prior exam -- Category 1 - continuous fetal monitoring  -- GBS negative  Leg pain/numbness: likely 2/2 to nerve and venous compression as baby descends -- low concern for DVT as Homan's is negative and the left leg is not warm  -- recommended repositioning onto patient's L side   JOrlene Plum MD Family Medicine Resident 12:44 AM

## 2018-01-24 ENCOUNTER — Encounter (HOSPITAL_COMMUNITY): Payer: Self-pay

## 2018-01-24 DIAGNOSIS — Z3A39 39 weeks gestation of pregnancy: Secondary | ICD-10-CM

## 2018-01-24 DIAGNOSIS — O403XX0 Polyhydramnios, third trimester, not applicable or unspecified: Secondary | ICD-10-CM

## 2018-01-24 LAB — CBC
HEMATOCRIT: 34.7 % — AB (ref 36.0–46.0)
HEMOGLOBIN: 11.6 g/dL — AB (ref 12.0–15.0)
MCH: 30.6 pg (ref 26.0–34.0)
MCHC: 33.4 g/dL (ref 30.0–36.0)
MCV: 91.6 fL (ref 80.0–100.0)
Platelets: 157 10*3/uL (ref 150–400)
RBC: 3.79 MIL/uL — AB (ref 3.87–5.11)
RDW: 13.5 % (ref 11.5–15.5)
WBC: 29.9 10*3/uL — ABNORMAL HIGH (ref 4.0–10.5)

## 2018-01-24 MED ORDER — NALBUPHINE HCL 10 MG/ML IJ SOLN: 5.0000 mg | INTRAMUSCULAR | Status: DC | PRN

## 2018-01-24 MED ORDER — LACTATED RINGERS IV SOLN
INTRAVENOUS | Status: DC
  Administered 2018-01-24: 06:00:00 via INTRAVENOUS

## 2018-01-24 MED ORDER — OXYTOCIN 40 UNITS IN LACTATED RINGERS INFUSION - SIMPLE MED: 2.5000 [IU]/h | INTRAVENOUS | Status: AC

## 2018-01-24 MED ORDER — SENNOSIDES-DOCUSATE SODIUM 8.6-50 MG PO TABS
2.0000 | ORAL_TABLET | ORAL | Status: DC
  Administered 2018-01-25 (×2): 2 via ORAL
  Filled 2018-01-24 (×2): qty 2

## 2018-01-24 MED ORDER — SIMETHICONE 80 MG PO CHEW
80.0000 mg | CHEWABLE_TABLET | ORAL | Status: DC | PRN
  Administered 2018-01-24: 80 mg via ORAL

## 2018-01-24 MED ORDER — NALBUPHINE HCL 10 MG/ML IJ SOLN: 5.0000 mg | Freq: Once | INTRAMUSCULAR | Status: DC | PRN

## 2018-01-24 MED ORDER — DIPHENHYDRAMINE HCL 50 MG/ML IJ SOLN: 12.5000 mg | INTRAMUSCULAR | Status: DC | PRN

## 2018-01-24 MED ORDER — KETOROLAC TROMETHAMINE 30 MG/ML IJ SOLN: 30.0000 mg | Freq: Four times a day (QID) | INTRAMUSCULAR | Status: AC | PRN

## 2018-01-24 MED ORDER — SODIUM CHLORIDE 0.9% FLUSH: 3.0000 mL | INTRAVENOUS | Status: DC | PRN

## 2018-01-24 MED ORDER — ONDANSETRON HCL 4 MG/2ML IJ SOLN: 4.0000 mg | Freq: Three times a day (TID) | INTRAMUSCULAR | Status: DC | PRN

## 2018-01-24 MED ORDER — CEFAZOLIN SODIUM-DEXTROSE 2-4 GM/100ML-% IV SOLN
INTRAVENOUS | Status: AC
  Filled 2018-01-24: qty 100

## 2018-01-24 MED ORDER — MEPERIDINE HCL 25 MG/ML IJ SOLN
INTRAMUSCULAR | Status: AC
  Filled 2018-01-24: qty 1

## 2018-01-24 MED ORDER — FENTANYL CITRATE (PF) 100 MCG/2ML IJ SOLN
INTRAMUSCULAR | Status: AC
  Filled 2018-01-24: qty 2

## 2018-01-24 MED ORDER — ACETAMINOPHEN 325 MG PO TABS
650.0000 mg | ORAL_TABLET | ORAL | Status: DC | PRN
  Administered 2018-01-24 – 2018-01-25 (×2): 650 mg via ORAL
  Filled 2018-01-24 (×2): qty 2

## 2018-01-24 MED ORDER — MEPERIDINE HCL 25 MG/ML IJ SOLN
INTRAMUSCULAR | Status: DC | PRN
  Administered 2018-01-24 (×2): 12.5 mg via INTRAVENOUS

## 2018-01-24 MED ORDER — NALOXONE HCL 4 MG/10ML IJ SOLN
1.0000 ug/kg/h | INTRAVENOUS | Status: DC | PRN
  Filled 2018-01-24: qty 5

## 2018-01-24 MED ORDER — SIMETHICONE 80 MG PO CHEW
80.0000 mg | CHEWABLE_TABLET | Freq: Three times a day (TID) | ORAL | Status: DC
  Administered 2018-01-24 – 2018-01-26 (×5): 80 mg via ORAL
  Filled 2018-01-24 (×6): qty 1

## 2018-01-24 MED ORDER — OXYCODONE HCL 5 MG PO TABS
5.0000 mg | ORAL_TABLET | ORAL | Status: DC | PRN
  Administered 2018-01-25 (×2): 5 mg via ORAL
  Filled 2018-01-24 (×2): qty 1

## 2018-01-24 MED ORDER — WITCH HAZEL-GLYCERIN EX PADS: 1.0000 "application " | MEDICATED_PAD | CUTANEOUS | Status: DC | PRN

## 2018-01-24 MED ORDER — PRENATAL MULTIVITAMIN CH
1.0000 | ORAL_TABLET | Freq: Every day | ORAL | Status: DC
  Administered 2018-01-24 – 2018-01-26 (×3): 1 via ORAL
  Filled 2018-01-24 (×3): qty 1

## 2018-01-24 MED ORDER — SODIUM CHLORIDE 0.9 % IR SOLN
Status: DC | PRN
  Administered 2018-01-24 (×2): 1

## 2018-01-24 MED ORDER — IBUPROFEN 600 MG PO TABS
600.0000 mg | ORAL_TABLET | Freq: Four times a day (QID) | ORAL | Status: DC
  Administered 2018-01-24 – 2018-01-26 (×9): 600 mg via ORAL
  Filled 2018-01-24 (×9): qty 1

## 2018-01-24 MED ORDER — KETOROLAC TROMETHAMINE 30 MG/ML IJ SOLN
INTRAMUSCULAR | Status: AC
  Filled 2018-01-24: qty 1

## 2018-01-24 MED ORDER — FENTANYL CITRATE (PF) 100 MCG/2ML IJ SOLN: 25.0000 ug | INTRAMUSCULAR | Status: DC | PRN

## 2018-01-24 MED ORDER — ZOLPIDEM TARTRATE 5 MG PO TABS: 5.0000 mg | ORAL_TABLET | Freq: Every evening | ORAL | Status: DC | PRN

## 2018-01-24 MED ORDER — KETOROLAC TROMETHAMINE 30 MG/ML IJ SOLN
30.0000 mg | Freq: Four times a day (QID) | INTRAMUSCULAR | Status: AC | PRN
  Administered 2018-01-24: 30 mg via INTRAMUSCULAR

## 2018-01-24 MED ORDER — OXYCODONE HCL 5 MG PO TABS
10.0000 mg | ORAL_TABLET | ORAL | Status: DC | PRN
  Administered 2018-01-26: 10 mg via ORAL
  Filled 2018-01-24: qty 2

## 2018-01-24 MED ORDER — OXYTOCIN 10 UNIT/ML IJ SOLN
INTRAVENOUS | Status: DC | PRN
  Administered 2018-01-24: 40 [IU] via INTRAVENOUS

## 2018-01-24 MED ORDER — MORPHINE SULFATE (PF) 0.5 MG/ML IJ SOLN
INTRAMUSCULAR | Status: AC
  Filled 2018-01-24: qty 10

## 2018-01-24 MED ORDER — DIPHENHYDRAMINE HCL 25 MG PO CAPS: 25.0000 mg | ORAL_CAPSULE | ORAL | Status: DC | PRN

## 2018-01-24 MED ORDER — DIBUCAINE 1 % RE OINT: 1.0000 "application " | TOPICAL_OINTMENT | RECTAL | Status: DC | PRN

## 2018-01-24 MED ORDER — SIMETHICONE 80 MG PO CHEW
80.0000 mg | CHEWABLE_TABLET | ORAL | Status: DC
  Administered 2018-01-25 (×2): 80 mg via ORAL
  Filled 2018-01-24 (×2): qty 1

## 2018-01-24 MED ORDER — MORPHINE SULFATE (PF) 0.5 MG/ML IJ SOLN
INTRAMUSCULAR | Status: DC | PRN
  Administered 2018-01-24: 3 mg via EPIDURAL

## 2018-01-24 MED ORDER — FENTANYL CITRATE (PF) 100 MCG/2ML IJ SOLN
INTRAMUSCULAR | Status: DC | PRN
  Administered 2018-01-24: 100 ug via INTRAVENOUS

## 2018-01-24 MED ORDER — NALOXONE HCL 0.4 MG/ML IJ SOLN: 0.4000 mg | INTRAMUSCULAR | Status: DC | PRN

## 2018-01-24 MED ORDER — MEPERIDINE HCL 25 MG/ML IJ SOLN: 6.2500 mg | INTRAMUSCULAR | Status: DC | PRN

## 2018-01-24 MED ORDER — MENTHOL 3 MG MT LOZG: 1.0000 | LOZENGE | OROMUCOSAL | Status: DC | PRN

## 2018-01-24 MED ORDER — COCONUT OIL OIL: 1.0000 "application " | TOPICAL_OIL | Status: DC | PRN

## 2018-01-24 MED ORDER — TETANUS-DIPHTH-ACELL PERTUSSIS 5-2.5-18.5 LF-MCG/0.5 IM SUSP: 0.5000 mL | Freq: Once | INTRAMUSCULAR | Status: DC

## 2018-01-24 MED ORDER — DIPHENHYDRAMINE HCL 25 MG PO CAPS
25.0000 mg | ORAL_CAPSULE | ORAL | Status: DC | PRN
  Filled 2018-01-24: qty 1

## 2018-01-24 MED ORDER — SCOPOLAMINE 1 MG/3DAYS TD PT72
1.0000 | MEDICATED_PATCH | Freq: Once | TRANSDERMAL | Status: DC
  Filled 2018-01-24: qty 1

## 2018-01-24 NOTE — Plan of Care (Signed)
  Problem: Education: Goal: Knowledge of Childbirth will improve Outcome: Completed/Met Goal: Ability to make informed decisions regarding treatment and plan of care will improve Outcome: Completed/Met Goal: Ability to state and carry out methods to decrease the pain will improve Outcome: Completed/Met   Problem: Coping: Goal: Ability to verbalize concerns and feelings about labor and delivery will improve Outcome: Completed/Met   Problem: Life Cycle: Goal: Ability to make normal progression through stages of labor will improve Outcome: Completed/Met Goal: Ability to effectively push during vaginal delivery will improve Outcome: Completed/Met   Problem: Role Relationship: Goal: Ability to demonstrate positive interaction with the child will improve Outcome: Completed/Met   Problem: Safety: Goal: Risk of complications during labor and delivery will decrease Outcome: Completed/Met   Problem: Pain Management: Goal: Relief or control of pain from uterine contractions will improve Outcome: Completed/Met

## 2018-01-24 NOTE — Transfer of Care (Signed)
Immediate Anesthesia Transfer of Care Note  Patient: Maria Cooper  Procedure(s) Performed: CESAREAN SECTION (N/A )  Patient Location: PACU  Anesthesia Type:Epidural  Level of Consciousness: awake, alert  and patient cooperative  Airway & Oxygen Therapy: Patient Spontanous Breathing  Post-op Assessment: Report given to RN and Post -op Vital signs reviewed and stable  Post vital signs: Reviewed and stable  Last Vitals:  Vitals Value Taken Time  BP 166/98 01/24/2018 12:55 AM  Temp    Pulse 118 01/24/2018 12:56 AM  Resp 19 01/24/2018 12:56 AM  SpO2 98 % 01/24/2018 12:56 AM  Vitals shown include unvalidated device data.  Last Pain:  Vitals:   01/23/18 2311  TempSrc:   PainSc: 0-No pain         Complications: No apparent anesthesia complications

## 2018-01-24 NOTE — Anesthesia Postprocedure Evaluation (Signed)
Anesthesia Post Note  Patient: Maria Cooper  Procedure(s) Performed: CESAREAN SECTION (N/A )     Patient location during evaluation: Mother Baby Anesthesia Type: Epidural Level of consciousness: awake, awake and alert and oriented Pain management: pain level controlled Vital Signs Assessment: post-procedure vital signs reviewed and stable Respiratory status: spontaneous breathing and respiratory function stable Cardiovascular status: blood pressure returned to baseline Postop Assessment: no headache, no backache, epidural receding, patient able to bend at knees, no apparent nausea or vomiting, adequate PO intake and able to ambulate Anesthetic complications: no    Last Vitals:  Vitals:   01/24/18 0430 01/24/18 0545  BP: 110/61   Pulse: 67 76  Resp: 18 18  Temp: 36.7 C 36.7 C  SpO2: 97% 97%    Last Pain:  Vitals:   01/24/18 0545  TempSrc: Oral  PainSc: 4    Pain Goal:                 Bufford Spikes

## 2018-01-24 NOTE — Lactation Note (Signed)
This note was copied from a baby's chart. Lactation Consultation Note  Patient Name: Maria Cooper Today's Date: 01/24/2018 Reason for consult: Initial assessment;Primapara;1st time breastfeeding;Term;Other (Comment)(LGA)  71 hours old FT female who is being exclusively BF by his mother, she's a P1. 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 Baby is bruised, and mom aware of a higher risk for jaundice. LC offered to set up a DEBP but mom is not ready to pump yet, but will let her RN know when she is; baby is LGA.  Offered assistance with latch and mom agreed to wake baby up. LC took baby STS to mom's breast and he was able to latch in football positioin only after a few tries; audible swallows were heard; baby was able to feed for 15 minutes. Mom and LC did some breast compressions every time he started to doze off to wake him up. Mom is small breasted but has plenty of colostrum, she was able to express several drops when revising hand expression with LC, LC also showed parents how to finger feed baby. Cluster feeding was discussed  Feeding plan:  1. Encouraged mom to feed baby STS 8-12 times/24 hours or sooner if feeding cues are present 2. Hand expression and finger/spoon feeding was also encouraged 3. Mom will let her RN know when she's ready to start a DEBP  4. She'll also notify her RN when she has picked up her employee pump to notify lactation for issuance  BF brochure, BF resources and feeding diary were reviewed. Parents reported all questions and concerns were answered, they're both aware of [place] services and will call PRN.  Maternal Data Formula Feeding for Exclusion: No Has patient been taught Hand Expression?: Yes Does the patient have breastfeeding experience prior to this delivery?: No  Feeding Feeding Type: Breast Fed  LATCH  Score Latch: Repeated attempts needed to sustain latch, nipple held in mouth throughout feeding, stimulation needed to elicit sucking reflex.  Audible Swallowing: A few with stimulation  Type of Nipple: Everted at rest and after stimulation  Comfort (Breast/Nipple): Soft / non-tender  Hold (Positioning): Assistance needed to correctly position infant at breast and maintain latch.  LATCH Score: 7  Interventions Interventions: Breast feeding basics reviewed;Assisted with latch;Skin to skin;Breast massage;Hand express;Breast compression;Adjust position;Support pillows  Lactation Tools Discussed/Used WIC Program: No   Consult Status Consult Status: Follow-up Date: 01/25/18 Follow-up type: In-patient    Lorisa Scheid Francene Boyers 01/24/2018, 4:16 PM

## 2018-01-24 NOTE — Addendum Note (Signed)
Addendum  created 01/24/18 1552 by Bufford Spikes, CRNA   Sign clinical note

## 2018-01-24 NOTE — Lactation Note (Signed)
This note was copied from a baby's chart. Lactation Consultation Note  Patient Name: Maria Cooper Today's Date: 01/24/2018 Reason for consult: Initial assessment;Primapara;1st time breastfeeding;Term;Other (Comment)(LGA)  RN Peter Congo gave [facility] pump form filled out, mom decided to get the [employer] Pump and Style Backpack as her personal pump with her [employer]. No further questions and concerns at this point.  Maternal Data Formula Feeding for Exclusion: No Has patient been taught Hand Expression?: Yes Does the patient have breastfeeding experience prior to this delivery?: No  Feeding Feeding Type: Breast Fed  LATCH Score Latch: Repeated attempts needed to sustain latch, nipple held in mouth throughout feeding, stimulation needed to elicit sucking reflex.  Audible Swallowing: A few with stimulation  Type of Nipple: Everted at rest and after stimulation  Comfort (Breast/Nipple): Soft / non-tender  Hold (Positioning): Assistance needed to correctly position infant at breast and maintain latch.  LATCH Score: 7  Interventions Interventions: Breast feeding basics reviewed;Assisted with latch;Skin to skin;Breast massage;Hand express;Breast compression;Adjust position;Support pillows  Lactation Tools Discussed/Used WIC Program: No   Consult Status Consult Status: Follow-up Date: 01/25/18 Follow-up type: In-patient    Mylene Bow Francene Boyers 01/24/2018, 7:49 PM

## 2018-01-24 NOTE — Anesthesia Postprocedure Evaluation (Signed)
Anesthesia Post Note  Patient: Maria Cooper  Procedure(s) Performed: CESAREAN SECTION (N/A )     Patient location during evaluation: PACU Anesthesia Type: Epidural Level of consciousness: oriented and awake and alert Pain management: pain level controlled Vital Signs Assessment: post-procedure vital signs reviewed and stable Respiratory status: spontaneous breathing, respiratory function stable and nonlabored ventilation Cardiovascular status: blood pressure returned to baseline and stable Postop Assessment: no headache, no backache, no apparent nausea or vomiting, patient able to bend at knees and epidural receding Anesthetic complications: no    Last Vitals:  Vitals:   01/24/18 0225 01/24/18 0325  BP: 112/72 102/67  Pulse: 67 66  Resp: 18 17  Temp: 36.8 C 36.4 C  SpO2: 95% 96%    Last Pain:  Vitals:   01/24/18 0325  TempSrc: Oral  PainSc: 0-No pain   Pain Goal:                 Krystyn Picking A.

## 2018-01-24 NOTE — Op Note (Signed)
[name] PROCEDURE DATE: 01/24/2018  PREOPERATIVE DIAGNOSES: Intrauterine pregnancy at 37w2dweeks gestation; failure to progress (descend)  prolonged second stage   POSTOPERATIVE DIAGNOSES: The same  PROCEDURE: Primary Low Transverse Cesarean Section  SURGEON:  Dr. LDarlis LoanASSISTANT:  Dr. LGerri Lins ANESTHESIOLOGY TEAM: Anesthesiologist: FJosephine Igo MD; OJaneece Riggers MD; WFreddrick March MD CRNA: BSandrea Matte CRNA  INDICATIONS: Maria AGUINIGAis a 22y.o. G1P1001 at 23w2dere for cesarean section secondary to the indications listed under preoperative diagnoses; please see preoperative note for further details.  The risks of cesarean section were discussed with the patient including but were not limited to: bleeding which may require transfusion or reoperation; infection which may require antibiotics; injury to bowel, bladder, ureters or other surrounding organs; injury to the fetus; need for additional procedures including hysterectomy in the event of a life-threatening hemorrhage; placental abnormalities wth subsequent pregnancies, incisional problems, thromboembolic phenomenon and other postoperative/anesthesia complications.   The patient concurred with the proposed plan, giving informed written consent for the procedure.    FINDINGS:  Viable female infant in cephalic presentation.  Apgars 7 and 9.  Clear amniotic fluid.  Intact placenta, three vessel cord.  Normal uterus, fallopian tubes and ovaries bilaterally.  ANESTHESIA: Epidural  INTRAVENOUS FLUIDS: 1600 ml   ESTIMATED BLOOD LOSS: 232 ml URINE OUTPUT:  150 ml SPECIMENS: Placenta sent to L&D COMPLICATIONS: None immediate  PROCEDURE IN DETAIL:  The patient preoperatively received intravenous antibiotics and had sequential compression devices applied to her lower extremities.  She was then taken to the operating room where the epidural anesthesia was dosed up to surgical level and was found to be  adequate. She was then placed in a dorsal supine position with a leftward tilt, and prepped and draped in a sterile manner.  A foley catheter was already in place.  After an adequate timeout was performed, a Pfannenstiel skin incision was made with scalpel and carried through to the underlying layer of fascia. The fascia was incised in the midline, and this incision was extended bilaterally bluntly. A Kocher clamp was applied to the superior aspect of the fascial incision and the underlying rectus muscles were dissected off bluntly. The rectus muscles were separated in the midline and the peritoneum was entered bluntly.  Attention was turned to the lower uterine segment where a low transverse hysterotomy was made with a scalpel and extended bilaterally bluntly.  The infant was successfully delivered, the cord was clamped and cut after one minute, and the infant was handed over to the awaiting neonatology team. Uterine massage was then administered, and the placenta delivered intact with a three-vessel cord. The uterus was then cleared of clots and debris.  The hysterotomy was closed with 0 Monocryl in a running locked fashion, and an imbricating layer was also placed with 0 Monocryl.   The pelvis was cleared of all clot and debris. Irrigation was administered and hemostasis was confirmed on all surfaces.   The peritoneum and muscles were reapproximated with 2-0 Chromic interrupted stitches. The fascia was then closed using 0 Vicryl in a running fashion.  The subcutaneous layer was irrigated, and the skin was closed with a 4-0 Vicryl subcuticular stitch. The patient tolerated the procedure well. Sponge, instrument and needle counts were correct x 3.  She was taken to the recovery room in stable condition.   LaLambert ModyWaJuleen ChinaDO OB/GYN Fellow

## 2018-01-25 NOTE — Progress Notes (Signed)
Post-Op Day 1,PLTCS for FTP  Subjective: No complaints, up ad lib, voiding and tolerating PO, passing flatus,small lochia,.plans to breastfeed, oral progesterone-only contraceptive  Objective: Blood pressure (!) 100/48, pulse 66, temperature 98.6 F (37 C), temperature source Oral, resp. rate 18, height 5' 6"  (1.676 m), weight 77.2 kg, last menstrual period 04/24/2017, SpO2 97 %, unknown if currently breastfeeding.  Physical Exam:  General: alert, cooperative and no distress Lochia:normal flow Chest: CTAB Heart: RRR no m/r/g Abdomen: +BS, soft, nontender, dsg c/d/intact, no erythema Uterine Fundus: firm DVT Evaluation: No evidence of DVT seen on physical exam. Extremities: trace edema  Recent Labs    01/22/18 0741 01/24/18 0608  HGB 13.0 11.6*  HCT 38.3 34.7*    Assessment/Plan: Plan for discharge tomorrow and Breastfeeding   LOS: 3 days   Maria Cooper 01/25/2018, 7:29 AM

## 2018-01-26 DIAGNOSIS — Z98891 History of uterine scar from previous surgery: Secondary | ICD-10-CM

## 2018-01-26 MED ORDER — OXYCODONE HCL 5 MG PO TABS: 5.0000 mg | ORAL_TABLET | ORAL | 0 refills | Status: DC | PRN

## 2018-01-26 MED ORDER — NORETHINDRONE 0.35 MG PO TABS: 1.0000 | ORAL_TABLET | Freq: Every day | ORAL | 11 refills | Status: DC

## 2018-01-26 NOTE — Discharge Summary (Signed)
Obstetrics Discharge Summary OB/GYN Faculty Practice   Patient Name: Maria Cooper DOB: Mar 09, 1996 MRN: [id number]  Date of admission: 01/22/2018 Delivering MD: Florian Buff   Date of discharge: 01/26/2018  Admitting diagnosis: induction Intrauterine pregnancy: [redacted]w[redacted]d    Secondary diagnosis:   Principal Problem:   S/P cesarean section Active Problems:   Celiac sprue   Polyhydramnios   Fetal macrosomia   Failure to progress in labor     Discharge diagnosis: Term Pregnancy Delivered                                            Postpartum procedures: None  Complications: failure of descent Polyhydramnios, LGA  Hospital course: JJUDA LAJEUNESSEis a 22y.o. 362w2dho was admitted for IOL for polyhydramnios. Her pregnancy was complicated by celiac disease, fetal macrosomia. Her labor course was notable for failure to descend after 3.5 hrs of pushing. Please see delivery/op note for additional details. Her postpartum course was uncomplicated. She was breastfeeding without difficulty. By day of discharge, she was passing flatus, urinating, eating and drinking without difficulty. Her pain was well-controlled, and she was discharged home with oxycodone #15. She will follow-up in clinic in 1 week.  Physical exam  Vitals:   01/25/18 0628 01/25/18 1420 01/25/18 1430 01/25/18 2147  BP: (!) 100/48 110/81 103/60 (!) 100/58  Pulse: 66 70 83 67  Resp: 18 19 15 16   Temp: 98.6 F (37 C) 98.4 F (36.9 C) 98.1 F (36.7 C) 98.3 F (36.8 C)  TempSrc: Oral Oral Oral Oral  SpO2:    98%  Weight:      Height:       General: well-appearing, no acute distress Lochia: appropriate Uterine Fundus: firm Incision: Dressing is clean, dry, and intact DVT Evaluation: No evidence of DVT seen on physical exam. Labs: Lab Results  Component Value Date   WBC 29.9 (H) 01/24/2018   HGB 11.6 (L) 01/24/2018   HCT 34.7 (L) 01/24/2018   MCV 91.6 01/24/2018   PLT 157 01/24/2018   CMP Latest Ref Rng &  Units 01/10/2018  Glucose 65 - 99 mg/dL 76  BUN 6 - 20 mg/dL 5(L)  Creatinine 0.57 - 1.00 mg/dL 0.54(L)  Sodium 134 - 144 mmol/L 139  Potassium 3.5 - 5.2 mmol/L 3.7  Chloride 96 - 106 mmol/L 104  CO2 20 - 29 mmol/L 21  Calcium 8.7 - 10.2 mg/dL 9.0  Total Protein 6.0 - 8.5 g/dL 5.8(L)  Total Bilirubin 0.0 - 1.2 mg/dL 0.4  Alkaline Phos 39 - 117 IU/L 222(H)  AST 0 - 40 IU/L 21  ALT 0 - 32 IU/L 15    Discharge instructions: Per After Visit Summary and "Baby and Me Booklet"  After visit meds:  Allergies as of 01/26/2018      Reactions   Citrus Other (See Comments)   Blisters in mouth, and swells   Nyquil Multi-symptom [pseudoeph-doxylamine-dm-apap] Nausea And Vomiting      Medication List    TAKE these medications   docusate sodium 100 MG capsule Commonly known as:  COLACE Take 100 mg by mouth daily as needed for mild constipation.   norethindrone 0.35 MG tablet Commonly known as:  MICRONOR,CAMILA,ERRIN Take 1 tablet (0.35 mg total) by mouth daily.   oxyCODONE 5 MG immediate release tablet Commonly known as:  Oxy IR/ROXICODONE Take 1 tablet (5 mg total)  by mouth every 4 (four) hours as needed for up to 15 doses for moderate pain or severe pain.   PRENATAL VITAMIN PLUS LOW IRON 27-1 MG Tabs TAKE 1 TABLET BY MOUTH ONCE DAILY AT 12 NOON       Postpartum contraception: Progesterone only pills Diet: Routine Diet Activity: Advance as tolerated. Pelvic rest for 6 weeks.   Outpatient follow up:1 week Follow-up Appt: Future Appointments  Date Time Provider [place]  01/31/2018  2:45 PM Florian Buff, MD [facility]  02/28/2018  4:00 PM Roma Schanz, CNM [facility]   Follow-up Visit:No follow-ups on file.  Newborn Data: Live born female  Birth Weight: 8 lb 14.2 oz (4031 g) APGAR: 7, 9  Newborn Delivery   Birth date/time:  01/24/2018 00:01:00 Delivery type:  C-Section, Low Transverse Trial of labor:  Yes C-section categorization:   Primary     Baby Feeding: Breast Disposition:home with mother  Mickel Duhamel, MD OB/GYN Fellow, Faculty Practice

## 2018-01-26 NOTE — Discharge Instructions (Signed)
Taking care of yourself after Baby arrives 1. Vaginal Bleeding: Vaginal bleeding is common after delivery, with the amount decreasing gradually over 1-2 weeks. If the bleeding increases, is mixed with pus, or is foul-smelling, call your doctor, as this may be a sign of infection.  2. Abdominal Pain: Abdominal cramping after delivery is common, especially when you breastfeed. The same hormones responsible for letting milk down to your nipple also contract your uterus. If the pain worsens, or occurs more frequently over 48 hrs after delivery, call your doctor.  3. Fevers: After delivery you are at increased risk of developing an infection. If you have a fever, increased vaginal bleeding, foul-smelling vaginal discharge, or increased abdominal pain, call your doctor.  4. Breast Feeding: Feeding every 1.5-3 hours keeps Baby satisfied and your milk in good supply. If 3 hours have gone by and Baby is sleeping, wake him/her up to feed. Nurse for 15-20 minutes on one breast before offering the other. Breast-fed babies often lose up to 7% of their birth weight in the first few days of life, but should start gaining about an ounce per day after 4 days.   5. Mastitis (Breast infection): Breaks in the skin or bacteria passing into your breast ducts can cause an infection. If you notice a triangular shaped area on your breast that is red, warm to the touch and tender, call your doctor. It is safe for Baby and helps you to heal faster if you keep breast feeding through this infection.  6. Postpartum Depression: Postpartum depression is very common after a woman delivers because of all the hormonal changes happening in her body. If you notice that you start to feel more sad or anxious than usual or have any thoughts of hurting yourself or Baby, tell someone right away.  If you have any questions or concerns, please call your doctor.

## 2018-01-26 NOTE — Progress Notes (Signed)
C/o back pain and left lower flank pressure/pain 8/10 that came on suddenly. Called anesthesia for eval-no concerned. Provide pt with heat pack and medications around noon. Re-eval-feeling better now.

## 2018-01-27 ENCOUNTER — Ambulatory Visit: Payer: Self-pay

## 2018-01-27 NOTE — Lactation Note (Signed)
This note was copied from a baby's chart. Lactation Consultation Note  Patient Name: Maria Cooper Today's Date: 01/27/2018   Despite excellent latch scores, there's less output than expected, so I encouraged Mom to pump. Resulting EBM was finger-fed to infant.   Large urine, noted to be concentrated on diaper with a small amount of uric acid crystals. After putting infant to breast, infant still hungry. Mom amenable to formula supplementation; MD aware.   A slightly heart-shaped tongue noted but Mom has had no discomfort with latch & cup-fed with ease.  The hand-out from the CDC, "How to Keep Your Breast Pump Kit Clean," was provided to Mom.  Dad shown how to wash breast pump parts.    Matthias Hughs [facility] 01/27/2018, 3:35 PM

## 2018-01-27 NOTE — Lactation Note (Signed)
This note was copied from a baby's chart. Lactation Consultation Note  Patient Name: Maria Cooper Today's Date: 01/27/2018   Infant is currently sleeping (fed relatively recently). Mom has my # to call when ready for consult.   Matthias Hughs [facility] 01/27/2018, 12:13 PM

## 2018-01-28 ENCOUNTER — Ambulatory Visit: Payer: Self-pay

## 2018-01-28 MED FILL — NORETHINDRONE 0.35 MG TAB: 0.35 | 28 days supply | Qty: 28 | Fill #0

## 2018-01-28 NOTE — Lactation Note (Signed)
This note was copied from a Maria's chart. Lactation Consultation Note  Patient Name: Maria Cooper Date: 01/28/2018  Maria Cooper is now 15 days old and still under bili lights but will be discharged today. Mom reports is now getting 1 oz when she pumps. Mom reports she feels comfortable going home. Mom reports she feels breastfeeding is going well now.  Reviewed how to know Maria is getting enough. Reviewed Breastfeeding Resource List and BFSG .  Urged mom to call as needed PRN.   Maternal Data    Feeding Feeding Type: Breast Fed Nipple Type: Slow - flow  LATCH Score                   Interventions    Lactation Tools Discussed/Used     Consult Status      Maria Cooper 01/28/2018, 7:39 AM

## 2018-01-29 ENCOUNTER — Inpatient Hospital Stay (HOSPITAL_COMMUNITY): Admission: AD | Admit: 2018-01-29 | Payer: No Typology Code available for payment source | Source: Home / Self Care

## 2018-01-31 ENCOUNTER — Encounter: Payer: Self-pay | Admitting: Obstetrics & Gynecology

## 2018-01-31 ENCOUNTER — Ambulatory Visit (INDEPENDENT_AMBULATORY_CARE_PROVIDER_SITE_OTHER): Payer: No Typology Code available for payment source | Admitting: Obstetrics & Gynecology

## 2018-01-31 VITALS — Ht 66.0 in | Wt 154.5 lb

## 2018-01-31 DIAGNOSIS — Z98891 History of uterine scar from previous surgery: Secondary | ICD-10-CM | POA: Diagnosis not present

## 2018-01-31 NOTE — Progress Notes (Signed)
  HPI: Patient returns for routine postoperative follow-up having undergone primary C section 01/24/2018 .  The patient's immediate postoperative recovery has been unremarkable. Since hospital discharge the patient reports musculoskeletal pain.   Current Outpatient Medications: acetaminophen (TYLENOL) 500 MG tablet, Take 500 mg by mouth as needed., Disp: , Rfl:  docusate sodium (COLACE) 100 MG capsule, Take 100 mg by mouth daily as needed for mild constipation., Disp: , Rfl:  ibuprofen (ADVIL,MOTRIN) 200 MG tablet, Take 600 mg by mouth every 6 (six) hours as needed., Disp: , Rfl:  oxyCODONE (OXY IR/ROXICODONE) 5 MG immediate release tablet, Take 1 tablet (5 mg total) by mouth every 4 (four) hours as needed for up to 15 doses for moderate pain or severe pain., Disp: 15 tablet, Rfl: 0 norethindrone (ORTHO MICRONOR) 0.35 MG tablet, Take 1 tablet (0.35 mg total) by mouth daily., Disp: 1 Package, Rfl: 11 Prenatal Vit-Fe Fumarate-FA (PRENATAL VITAMIN PLUS LOW IRON) 27-1 MG TABS, TAKE 1 TABLET BY MOUTH ONCE DAILY AT 12 NOON, Disp: , Rfl: 3  No current facility-administered medications for this visit.     Height 5' 6"  (1.676 m), weight 154 lb 8 oz (70.1 kg), currently breastfeeding.  Physical Exam: Incision clean dry intact  Diagnostic Tests:   Pathology: benign  Impression: S/p Primary Caesarean section for acynclitism  Plan:   Follow up: 4  weeks  Florian Buff, MD

## 2018-02-28 ENCOUNTER — Ambulatory Visit (INDEPENDENT_AMBULATORY_CARE_PROVIDER_SITE_OTHER): Payer: No Typology Code available for payment source | Admitting: Women's Health

## 2018-02-28 ENCOUNTER — Encounter: Payer: Self-pay | Admitting: Women's Health

## 2018-02-28 DIAGNOSIS — Z98891 History of uterine scar from previous surgery: Secondary | ICD-10-CM | POA: Diagnosis not present

## 2018-02-28 NOTE — Patient Instructions (Addendum)
Tips To Increase Milk Supply  Lots of water! Enough so that your urine is clear  Plenty of calories, if you're not getting enough calories, your milk supply can decrease  Breastfeed/pump often, every 2-3 hours x 20-64mns  Fenugreek 3 pills 3 times a day, this may make your urine smell like maple syrup  Mother's Milk Tea  Lactation cookies, google for the recipe  Real oatmeal   Norethindrone tablets (contraception) What is this medicine? NORETHINDRONE (nor eth IN drone) is an oral contraceptive. The product contains a female hormone known as a progestin. It is used to prevent pregnancy. This medicine may be used for other purposes; ask your health care provider or pharmacist if you have questions. COMMON BRAND NAME(S): Camila, Deblitane 28-Day, Errin, Heather, JNorth [place] Jolivette, [place] Nor-QD, Nora-BE, Norlyroc, Ortho Micronor, SAmerican Express28-Day What should I tell my health care provider before I take this medicine? They need to know if you have any of these conditions: -blood vessel disease or blood clots -breast, cervical, or vaginal cancer -diabetes -heart disease -kidney disease -liver disease -mental depression -migraine -seizures -stroke -vaginal bleeding -an unusual or allergic reaction to norethindrone, other medicines, foods, dyes, or preservatives -pregnant or trying to get pregnant -breast-feeding How should I use this medicine? Take this medicine by mouth with a glass of water. You may take it with or without food. Follow the directions on the prescription label. Take this medicine at the same time each day and in the order directed on the package. Do not take your medicine more often than directed. Contact your pediatrician regarding the use of this medicine in children. Special care may be needed. This medicine has been used in female children who have started having menstrual periods. A patient package insert for the product will be given with each prescription  and refill. Read this sheet carefully each time. The sheet may change frequently. Overdosage: If you think you have taken too much of this medicine contact a poison control center or emergency room at once. NOTE: This medicine is only for you. Do not share this medicine with others. What if I miss a dose? Try not to miss a dose. Every time you miss a dose or take a dose late your chance of pregnancy increases. When 1 pill is missed (even if only 3 hours late), take the missed pill as soon as possible and continue taking a pill each day at the regular time (use a back up method of birth control for the next 48 hours). If more than 1 dose is missed, use an additional birth control method for the rest of your pill pack until menses occurs. Contact your health care professional if more than 1 dose has been missed. What may interact with this medicine? Do not take this medicine with any of the following medications: -amprenavir or fosamprenavir -bosentan This medicine may also interact with the following medications: -antibiotics or medicines for infections, especially rifampin, rifabutin, rifapentine, and griseofulvin, and possibly penicillins or tetracyclines -aprepitant -barbiturate medicines, such as phenobarbital -carbamazepine -felbamate -modafinil -oxcarbazepine -phenytoin -ritonavir or other medicines for HIV infection or AIDS -St. John's wort -topiramate This list may not describe all possible interactions. Give your health care provider a list of all the medicines, herbs, non-prescription drugs, or dietary supplements you use. Also tell them if you smoke, drink alcohol, or use illegal drugs. Some items may interact with your medicine. What should I watch for while using this medicine? Visit your doctor or health care professional for  regular checks on your progress. You will need a regular breast and pelvic exam and Pap smear while on this medicine. Use an additional method of birth  control during the first cycle that you take these tablets. If you have any reason to think you are pregnant, stop taking this medicine right away and contact your doctor or health care professional. If you are taking this medicine for hormone related problems, it may take several cycles of use to see improvement in your condition. This medicine does not protect you against HIV infection (AIDS) or any other sexually transmitted diseases. What side effects may I notice from receiving this medicine? Side effects that you should report to your doctor or health care professional as soon as possible: -breast tenderness or discharge -pain in the abdomen, chest, groin or leg -severe headache -skin rash, itching, or hives -sudden shortness of breath -unusually weak or tired -vision or speech problems -yellowing of skin or eyes Side effects that usually do not require medical attention (report to your doctor or health care professional if they continue or are bothersome): -changes in sexual desire -change in menstrual flow -facial hair growth -fluid retention and swelling -headache -irritability -nausea -weight gain or loss This list may not describe all possible side effects. Call your doctor for medical advice about side effects. You may report side effects to FDA at 1-800-FDA-1088. Where should I keep my medicine? Keep out of the reach of children. Store at room temperature between 15 and 30 degrees C (59 and 86 degrees F). Throw away any unused medicine after the expiration date. NOTE: This sheet is a summary. It may not cover all possible information. If you have questions about this medicine, talk to your doctor, pharmacist, or health care provider.  2019 Elsevier/Gold Standard (2011-11-17 16:41:35)

## 2018-02-28 NOTE — Progress Notes (Signed)
   POSTPARTUM VISIT Patient name: Maria Cooper MRN [id number]  Date of birth: 1995/07/06 Chief Complaint:   Postpartum Care  History of Present Illness:   Maria Cooper is a 22 y.o. G36P1001 Caucasian female being seen today for a postpartum visit. She is 4 weeks postpartum following a primary cesarean section, low transverse incision at 39.2 gestational weeks after failure to descend after 3.5hrs pushing, baby weighed 8lb14oz-was IOL for polyhydramnios, . Anesthesia: epidural. Laceration: n/a. I have fully reviewed the prenatal and intrapartum course. Pregnancy complicated by polyhydramnios and LGA, normal GTT. Postpartum course has been uncomplicated. Bleeding no bleeding. Bowel function is normal. Bladder function is normal.  Patient is not sexually active. Last sexual activity: prior to birth of baby.  Baby being tx for thrush, she has been using nystatin for her breasts, not completely gone yet Contraception method is has rx for POPs, hasn't started yet.  [place] Postpartum Depression Screening: negative. Score 8.   Last pap 02/2016 @ [facility].  Results were normal .  No LMP recorded.  Baby's course has been uncomplicated. Baby is feeding by breast & bottle.  Review of Systems:   Pertinent items are noted in HPI Denies Abnormal vaginal discharge w/ itching/odor/irritation, headaches, visual changes, shortness of breath, chest pain, abdominal pain, severe nausea/vomiting, or problems with urination or bowel movements. Pertinent History Reviewed:  Reviewed past medical,surgical, obstetrical and family history.  Reviewed problem list, medications and allergies. OB History  Gravida Para Term Preterm AB Living  1 1 1  0 0 1  SAB TAB Ectopic Multiple Live Births  0 0 0 0 1    # Outcome Date GA Lbr Len/2nd Weight Sex Delivery Anes PTL Lv  1 Term 01/24/18 [redacted]w[redacted]d/ 06:59 8 lb 14.2 oz (4.031 kg) M CS-LTranv EPI  LIV    Obstetric Comments  Followed by Dr. FBing Quarry    Physical  Assessment:   Vitals:   02/28/18 1445  BP: 108/71  Pulse: (!) 115  Weight: 145 lb (65.8 kg)  Height: 5' 6"  (1.676 m)  Body mass index is 23.4 kg/m.       Physical Examination:   General appearance: alert, well appearing, and in no distress  Mental status: alert, oriented to person, place, and time  Skin: warm & dry   Cardiovascular: normal heart rate noted   Respiratory: normal respiratory effort, no distress   Breasts: deferred, no complaints   Abdomen: soft, non-tender, c/s incision well healed, few small areas where suture visible, removed these   Pelvic: VULVA: normal appearing vulva with no masses, tenderness or lesions, UTERUS: uterus is normal size, shape, consistency and nontender  Rectal: no hemorrhoids  Extremities: no edema       No results found for this or any previous visit (from the past 24 hour(s)).  Assessment & Plan:  1) Postpartum exam 2) 4 wks s/p PLTCS for FTD after 3.5hrs pushing, IOL for polyhydramnios 3) Breast & bottlefeeding 4) Depression screening 5) Contraception counseling, pt prefers oral progesterone-only contraceptive, already has rx. Understands has to take at exact same time daily to be effective, if late taking use condom as back-up   Meds: No orders of the defined types were placed in this encounter.   Follow-up: Return in about 1 year (around 03/01/2019) for Pap & physical.   No orders of the defined types were placed in this encounter.   [place] [facility]12/19/2019 3:30 PM

## 2018-03-21 ENCOUNTER — Telehealth: Payer: No Typology Code available for payment source | Admitting: Physician Assistant

## 2018-03-21 DIAGNOSIS — R079 Chest pain, unspecified: Secondary | ICD-10-CM

## 2018-03-21 NOTE — Progress Notes (Signed)
For the safety of you and your child, I recommend a face to face office visit with a health care provider.  Many mothers need to take medicines during their pregnancy and while nursing.  Almost all medicines pass into the breast milk in small quantities.  Most are generally considered safe for a mother to take but some medicines must be avoided.  After reviewing your E-Visit request, I recommend that you consult your OB/GYN or pediatrician for medical advice in relation to your condition and prescription medications while pregnant or breastfeeding.   If you are having a true medical emergency please call 911.  If you need an urgent face to face visit, [facility] has four urgent care centers for your convenience.  If you need care fast and have a high deductible or no insurance consider:   [email or web address] to reserve your spot online an avoid wait times  [facility] [address], [address] [place], [place] [address] 8 am to 8 pm Monday-Friday 10 am to 4 pm Saturday-Sunday *Across the street from [employer]  [place], [address] 8 am to 5 pm Monday-Friday * In the [facility] on the [facility]   The following sites will take your  insurance:  . [facility] [place] a Provider at this Location  [address] [place], [place] [address] . 10 am to 8 pm Monday-Friday . 12 pm to 8 pm Saturday-Sunday   . [facility] [place] a Provider at this Location  [place], [place], [place] [address] . 8 am to 8 pm Monday-Friday . 9 am to 6 pm Saturday . 11 am to 6 pm Sunday   . [facility] at [place] Get Driving Directions  [address] [place], [place] [address] . 8 am to 8 pm Monday-Friday . 8 am to 4 pm  Saturday-Sunday   Your e-visit answers were reviewed by a board certified advanced clinical practitioner to complete your personal care plan.  Thank you for using e-Visits.

## 2018-06-27 ENCOUNTER — Other Ambulatory Visit: Payer: Self-pay | Admitting: Women's Health

## 2018-06-27 MED ORDER — NORETHINDRONE-ETH ESTRADIOL 0.4-35 MG-MCG PO TABS: 1.0000 | ORAL_TABLET | Freq: Every day | ORAL | 11 refills | Status: DC

## 2019-01-29 ENCOUNTER — Other Ambulatory Visit: Payer: Self-pay

## 2019-01-29 DIAGNOSIS — Z20822 Contact with and (suspected) exposure to covid-19: Secondary | ICD-10-CM

## 2019-01-31 LAB — NOVEL CORONAVIRUS, NAA: SARS-CoV-2, NAA: NOT DETECTED

## 2019-01-31 LAB — SPECIMEN STATUS REPORT

## 2019-06-25 ENCOUNTER — Other Ambulatory Visit: Payer: Self-pay

## 2019-06-25 ENCOUNTER — Other Ambulatory Visit (HOSPITAL_COMMUNITY): Payer: Self-pay | Admitting: Adult Health

## 2019-06-25 ENCOUNTER — Ambulatory Visit (INDEPENDENT_AMBULATORY_CARE_PROVIDER_SITE_OTHER): Payer: No Typology Code available for payment source

## 2019-06-25 VITALS — BP 128/68 | HR 90 | Ht 66.0 in | Wt 159.4 lb

## 2019-06-25 DIAGNOSIS — Z3201 Encounter for pregnancy test, result positive: Secondary | ICD-10-CM | POA: Diagnosis not present

## 2019-06-25 LAB — POCT URINE PREGNANCY: Preg Test, Ur: POSITIVE — AB

## 2019-06-25 MED ORDER — PRENATAL PLUS 27-1 MG PO TABS: 1.0000 | ORAL_TABLET | Freq: Every day | ORAL | 12 refills | Status: DC

## 2019-06-25 MED FILL — PRENATAL FE 27-1 MG TAB: 27-1 | 30 days supply | Qty: 30 | Fill #0

## 2019-06-25 NOTE — Progress Notes (Signed)
   NURSE VISIT- PREGNANCY CONFIRMATION   SUBJECTIVE:  Maria Cooper is a 24 y.o. G60P1001 female   Here for pregnancy confirmation.  Home pregnancy test +  taking prenatal vitamins gummies.but want prenatal vitamins she was on with last pregnancy  OBJECTIVE:  There were no vitals taken for this visit.  Appears well, in no apparent distress OB History  Gravida Para Term Preterm AB Living  1 1 1  0 0 1  SAB TAB Ectopic Multiple Live Births  0 0 0 0 1    # Outcome Date GA Lbr Len/2nd Weight Sex Delivery Anes PTL Lv  1 Term 01/24/18 [redacted]w[redacted]d/ 06:59 8 lb 14.2 oz (4.031 kg) M CS-LTranv EPI  LIV    Obstetric Comments  Followed by Dr. FBing Quarry       ASSESSMENT:  pregnancy test +. Having some cramping no spotting,advise increase fluids.need prenatal vitamins call in pharmacy. PLAN: Schedule for dating ultrasound  1 week  Prenatal vitamins need, will route not Lilla griffin   Nausea medicines not need   OB packet given: Yes  PLadonna Snide 06/25/2019 10:29 AM

## 2019-06-25 NOTE — Addendum Note (Signed)
Addended by: Derrek Monaco A on: 06/25/2019 11:13 AM   Modules accepted: Orders

## 2019-06-25 NOTE — Progress Notes (Signed)
Chart reviewed for nurse visit. Agree with plan of care. Rx prenatal vitamin Mayreli, Alden, NP 06/25/2019 11:13 AM

## 2019-06-30 ENCOUNTER — Other Ambulatory Visit: Payer: Self-pay | Admitting: Women's Health

## 2019-06-30 ENCOUNTER — Telehealth: Payer: Self-pay | Admitting: Women's Health

## 2019-06-30 MED ORDER — DOXYLAMINE-PYRIDOXINE 10-10 MG PO TBEC: DELAYED_RELEASE_TABLET | ORAL | 6 refills | Status: DC

## 2019-06-30 MED FILL — DOXYLAMINE-PYRIDOXINE 10-10: 10-10 | 27 days supply | Qty: 100 | Fill #0

## 2019-06-30 NOTE — Telephone Encounter (Signed)
Pt would like to see if something for nausea can be sent in to [facility] on [address].

## 2019-07-02 ENCOUNTER — Other Ambulatory Visit: Payer: Self-pay | Admitting: Obstetrics and Gynecology

## 2019-07-02 DIAGNOSIS — O3680X0 Pregnancy with inconclusive fetal viability, not applicable or unspecified: Secondary | ICD-10-CM

## 2019-07-03 ENCOUNTER — Other Ambulatory Visit: Payer: Self-pay | Admitting: Obstetrics and Gynecology

## 2019-07-03 ENCOUNTER — Ambulatory Visit (INDEPENDENT_AMBULATORY_CARE_PROVIDER_SITE_OTHER): Payer: No Typology Code available for payment source

## 2019-07-03 ENCOUNTER — Other Ambulatory Visit: Payer: Self-pay

## 2019-07-03 DIAGNOSIS — O30041 Twin pregnancy, dichorionic/diamniotic, first trimester: Secondary | ICD-10-CM

## 2019-07-03 DIAGNOSIS — O3680X0 Pregnancy with inconclusive fetal viability, not applicable or unspecified: Secondary | ICD-10-CM | POA: Diagnosis not present

## 2019-07-03 DIAGNOSIS — Z3A01 Less than 8 weeks gestation of pregnancy: Secondary | ICD-10-CM

## 2019-07-03 NOTE — Progress Notes (Signed)
[place] 7 wks IUP,DI/DI twins,normal ovaries BABY A: CRL 5.67 mm,positive fht 95 bpm BABY B: CRL 7.43 mm,positive fht 106 bpm

## 2019-07-07 ENCOUNTER — Other Ambulatory Visit: Payer: Self-pay | Admitting: Women's Health

## 2019-07-07 MED ORDER — PROMETHAZINE HCL 25 MG PO TABS: 12.5000 mg | ORAL_TABLET | Freq: Four times a day (QID) | ORAL | 0 refills | Status: DC | PRN

## 2019-07-07 MED ORDER — PROMETHAZINE HCL 25 MG PO TABS: 12.5000 mg | ORAL_TABLET | Freq: Four times a day (QID) | ORAL | 3 refills | Status: DC | PRN

## 2019-07-07 MED FILL — PROMETHAZINE 25 MG TABLET: 25 | 8 days supply | Qty: 30 | Fill #0

## 2019-08-07 ENCOUNTER — Ambulatory Visit (INDEPENDENT_AMBULATORY_CARE_PROVIDER_SITE_OTHER): Payer: No Typology Code available for payment source | Admitting: Advanced Practice Midwife

## 2019-08-07 ENCOUNTER — Encounter: Payer: Self-pay | Admitting: Advanced Practice Midwife

## 2019-08-07 ENCOUNTER — Other Ambulatory Visit: Payer: No Typology Code available for payment source

## 2019-08-07 ENCOUNTER — Ambulatory Visit: Payer: No Typology Code available for payment source | Admitting: *Deleted

## 2019-08-07 ENCOUNTER — Other Ambulatory Visit (HOSPITAL_COMMUNITY)
Admission: RE | Admit: 2019-08-07 | Discharge: 2019-08-07 | Disposition: A | Discharge status: Home / Self Care | Payer: No Typology Code available for payment source | Source: Ambulatory Visit | Attending: Advanced Practice Midwife | Admitting: Advanced Practice Midwife

## 2019-08-07 VITALS — BP 117/63 | HR 101 | Wt 153.0 lb

## 2019-08-07 DIAGNOSIS — O0991 Supervision of high risk pregnancy, unspecified, first trimester: Secondary | ICD-10-CM

## 2019-08-07 DIAGNOSIS — O0990 Supervision of high risk pregnancy, unspecified, unspecified trimester: Secondary | ICD-10-CM | POA: Diagnosis not present

## 2019-08-07 DIAGNOSIS — O30049 Twin pregnancy, dichorionic/diamniotic, unspecified trimester: Secondary | ICD-10-CM | POA: Insufficient documentation

## 2019-08-07 DIAGNOSIS — O30041 Twin pregnancy, dichorionic/diamniotic, first trimester: Secondary | ICD-10-CM

## 2019-08-07 DIAGNOSIS — Z3A12 12 weeks gestation of pregnancy: Secondary | ICD-10-CM

## 2019-08-07 LAB — POCT URINALYSIS DIPSTICK OB
Blood, UA: NEGATIVE
Glucose, UA: NEGATIVE
Ketones, UA: NEGATIVE
Leukocytes, UA: NEGATIVE
Nitrite, UA: NEGATIVE
POC,PROTEIN,UA: NEGATIVE

## 2019-08-07 MED ORDER — ONDANSETRON 4 MG PO TBDP: 4.0000 mg | ORAL_TABLET | Freq: Four times a day (QID) | ORAL | 2 refills | Status: DC | PRN

## 2019-08-07 NOTE — Progress Notes (Signed)
INITIAL OBSTETRICAL VISIT Patient name: Maria Cooper MRN [id number]  Date of birth: 01-Mar-1996 Chief Complaint:   Initial Prenatal Visit (nausea)  History of Present Illness:   Maria Cooper is a 24 y.o. G38P1001 Caucasian female at 35w0dby LMP/7 week [place] an Estimated Date of Delivery: 02/19/20 being seen today for her initial obstetrical visit.   Her obstetrical history is significant for CS for CPD after 3.5 hours pushing, 8#14oz baby.  Mild polyhydramnios. She has celiac disease, did gtt w/sprite d/t citric acid allergy; QID testing was normal.  Today she reports nausea. Already on diclegis and phenergan Patient's last menstrual period was 05/15/2019. Last pap 2018. Results were: normal Review of Systems:   Pertinent items are noted in HPI Denies cramping/contractions, leakage of fluid, vaginal bleeding, abnormal vaginal discharge w/ itching/odor/irritation, headaches, visual changes, shortness of breath, chest pain, abdominal pain, severe nausea/vomiting, or problems with urination or bowel movements unless otherwise stated above.  Pertinent History Reviewed:  Reviewed past medical,surgical, social, obstetrical and family history.  Reviewed problem list, medications and allergies. OB History  Gravida Para Term Preterm AB Living  2 1 1  0 0 1  SAB TAB Ectopic Multiple Live Births  0 0 0 0 1    # Outcome Date GA Lbr Len/2nd Weight Sex Delivery Anes PTL Lv  2 Current           1 Term 01/24/18 342w2d 06:59 8 lb 14.2 oz (4.031 kg) M CS-LTranv EPI N LIV    Obstetric Comments  Followed by Dr. FeBing Quarry   Physical Assessment:   Vitals:   08/07/19 1528  BP: 117/63  Pulse: (!) 101  Weight: 153 lb (69.4 kg)  Body mass index is 24.69 kg/m.       Physical Examination:  General appearance - well appearing, and in no distress  Mental status - alert, oriented to person, place, and time  Psych:  She has a normal mood and affect  Skin - warm and dry, normal color, no  suspicious lesions noted  Chest - effort normal, all lung fields clear to auscultation bilaterally  Heart - normal rate and regular rhythm  Abdomen - soft, nontender  Extremities:  No swelling or varicosities noted   Pelvic - VULVA: normal appearing vulva with no masses, tenderness or lesions  VAGINA: normal appearing vagina with normal color and discharge, no lesions  CERVIX: normal appearing cervix without discharge or lesions, no CMT  Thin prep pap is done with HR HPV cotesting  Fetal Heart Rate (bpm): 150/150 via bedside US[place]oreaResults for orders placed or performed in visit on 08/07/19 (from the past 24 hour(s))  POC Urinalysis Dipstick OB   Collection Time: 08/07/19  3:39 PM  Result Value Ref Range   Color, UA     Clarity, UA     Glucose, UA Negative Negative   Bilirubin, UA     Ketones, UA neg    Spec Grav, UA     Blood, UA neg    pH, UA     POC,PROTEIN,UA Negative Negative, Trace, Small (1+), Moderate (2+), Large (3+), 4+   Urobilinogen, UA     Nitrite, UA neg    Leukocytes, UA Negative Negative   Appearance     Odor      Assessment & Plan:  1) High-Risk Pregnancy G2P1001 at 129w0dth an Estimated Date of Delivery: 02/19/20   2) Initial OB visit  3) Di/Di twins  Meds:  Meds ordered  this encounter  Medications  . ondansetron (ZOFRAN ODT) 4 MG disintegrating tablet    Sig: Take 1 tablet (4 mg total) by mouth every 6 (six) hours as needed for nausea.    Dispense:  30 tablet    Refill:  2    Order Specific Question:   Supervising Provider    Answer:   Florian Buff [[id number]]    Initial labs obtained Continue prenatal vitamins Reviewed n/v relief measures and warning s/s to report.  Rx zofran Reviewed recommended weight gain based on pre-gravid BMI Encouraged well-balanced diet Genetic Screening discussed First Screen, Integrated Screen, Quad Screen, CVS and Amniocentesis: declined Cystic fibrosis screening declined SMA screening declined Fragile X screening  declined Ultrasound discussed; fetal survey: requested CCNC completed  Follow-up: Return in about 4 weeks (around 09/04/2019) for [place] w/CNM or MD.   Orders Placed This Encounter  Procedures  . Urine Culture  . CBC/D/Plt+RPR+Rh+ABO+Rub Ab...  . Hgb Fractionation Cascade  . POC Urinalysis Dipstick OB    Christin Fudge DNP, CNM 08/07/2019 7:22 PM

## 2019-08-07 NOTE — Patient Instructions (Signed)
Maria Cooper, I greatly value your feedback.  If you receive a survey following your visit with [place] today, we appreciate you taking the time to fill it out.  Thanks, Maria Berthold, DNP, [place]!!! It is now [place] at [facility] ([place], [place] [address]) Entrance located off of [address] parking   Nausea & Vomiting  Have saltine crackers or pretzels by your bed and eat a few bites before you raise your head out of bed in the morning  Eat small frequent meals throughout the day instead of large meals  Drink plenty of fluids throughout the day to stay hydrated, just don't drink a lot of fluids with your meals.  This can make your stomach fill up faster making you feel sick  Do not brush your teeth right after you eat  Products with real ginger are good for nausea, like ginger ale and ginger hard candy Make sure it says made with real ginger!  Sucking on sour candy like lemon heads is also good for nausea  If your prenatal vitamins make you nauseated, take them at night so you will sleep through the nausea  Sea Bands  If you feel like you need medicine for the nausea & vomiting please let us know  If you are unable to keep any fluids or food down please let us know   Constipation  Drink plenty of fluid, preferably water, throughout the day  Eat foods high in fiber such as fruits, vegetables, and grains  Exercise, such as walking, is a good way to keep your bowels regular  Drink warm fluids, especially warm prune juice, or decaf coffee  Eat a 1/2 cup of real oatmeal (not instant), 1/2 cup applesauce, and 1/2-1 cup warm prune juice every day  If needed, you may take Colace (docusate sodium) stool softener once or twice a day to help keep the stool soft.   If you still are having problems with constipation, you may take Miralax once daily as needed to help keep your bowels regular.    Home Blood Pressure Monitoring for Patients   Your provider has recommended that you check your blood pressure (BP) at least once a week at home. If you do not have a blood pressure cuff at home, one will be provided for you. Contact your provider if you have not received your monitor within 1 week.   Helpful Tips for Accurate Home Blood Pressure Checks  . Don't smoke, exercise, or drink caffeine 30 minutes before checking your BP . Use the restroom before checking your BP (a full bladder can raise your pressure) . Relax in a comfortable upright chair . Feet on the ground . Left arm resting comfortably on a flat surface at the level of your heart . Legs uncrossed . Back supported . Sit quietly and don't talk . Place the cuff on your bare arm . Adjust snuggly, so that only two fingertips can fit between your skin and the top of the cuff . Check 2 readings separated by at least one minute . Keep a log of your BP readings . For a visual, please reference this diagram: http://ccnc.care/bpdiagram  Provider Name: [facility]     Phone: [phone]  Zone 1: ALL CLEAR  Continue to monitor your symptoms:  . BP reading is less than 140 (top number) or less than 90 (bottom number)  . No right upper stomach  pain . No headaches or seeing spots . No feeling nauseated or throwing up . No swelling in face and hands  Zone 2: CAUTION Call your doctor's office for any of the following:  . BP reading is greater than 140 (top number) or greater than 90 (bottom number)  . Stomach pain under your ribs in the middle or right side . Headaches or seeing spots . Feeling nauseated or throwing up . Swelling in face and hands  Zone 3: EMERGENCY  Seek immediate medical care if you have any of the following:  . BP reading is greater than160 (top number) or greater than 110 (bottom number) . Severe headaches not improving with Tylenol . Serious difficulty catching your breath . Any worsening  symptoms from Zone 2    First Trimester of Pregnancy The first trimester of pregnancy is from week 1 until the end of week 12 (months 1 through 3). A week after a sperm fertilizes an egg, the egg will implant on the wall of the uterus. This embryo will begin to develop into a baby. Genes from you and your partner are forming the baby. The female genes determine whether the baby is a boy or a girl. At 6-8 weeks, the eyes and face are formed, and the heartbeat can be seen on ultrasound. At the end of 12 weeks, all the baby's organs are formed.  Now that you are pregnant, you will want to do everything you can to have a healthy baby. Two of the most important things are to get good prenatal care and to follow your health care provider's instructions. Prenatal care is all the medical care you receive before the baby's birth. This care will help prevent, find, and treat any problems during the pregnancy and childbirth. BODY CHANGES Your body goes through many changes during pregnancy. The changes vary from woman to woman.   You may gain or lose a couple of pounds at first.  You may feel sick to your stomach (nauseous) and throw up (vomit). If the vomiting is uncontrollable, call your health care provider.  You may tire easily.  You may develop headaches that can be relieved by medicines approved by your health care provider.  You may urinate more often. Painful urination may mean you have a bladder infection.  You may develop heartburn as a result of your pregnancy.  You may develop constipation because certain hormones are causing the muscles that push waste through your intestines to slow down.  You may develop hemorrhoids or swollen, bulging veins (varicose veins).  Your breasts may begin to grow larger and become tender. Your nipples may stick out more, and the tissue that surrounds them (areola) may become darker.  Your gums may bleed and may be sensitive to brushing and flossing.  Dark  spots or blotches (chloasma, mask of pregnancy) may develop on your face. This will likely fade after the baby is born.  Your menstrual periods will stop.  You may have a loss of appetite.  You may develop cravings for certain kinds of food.  You may have changes in your emotions from day to day, such as being excited to be pregnant or being concerned that something may go wrong with the pregnancy and baby.  You may have more vivid and strange dreams.  You may have changes in your hair. These can include thickening of your hair, rapid growth, and changes in texture. Some women also have hair loss during or after pregnancy, or hair that  feels dry or thin. Your hair will most likely return to normal after your baby is born. WHAT TO EXPECT AT YOUR PRENATAL VISITS During a routine prenatal visit:  You will be weighed to make sure you and the baby are growing normally.  Your blood pressure will be taken.  Your abdomen will be measured to track your baby's growth.  The fetal heartbeat will be listened to starting around week 10 or 12 of your pregnancy.  Test results from any previous visits will be discussed. Your health care provider may ask you:  How you are feeling.  If you are feeling the baby move.  If you have had any abnormal symptoms, such as leaking fluid, bleeding, severe headaches, or abdominal cramping.  If you have any questions. Other tests that may be performed during your first trimester include:  Blood tests to find your blood type and to check for the presence of any previous infections. They will also be used to check for low iron levels (anemia) and Rh antibodies. Later in the pregnancy, blood tests for diabetes will be done along with other tests if problems develop.  Urine tests to check for infections, diabetes, or protein in the urine.  An ultrasound to confirm the proper growth and development of the baby.  An amniocentesis to check for possible genetic  problems.  Fetal screens for spina bifida and Down syndrome.  You may need other tests to make sure you and the baby are doing well. HOME CARE INSTRUCTIONS  Medicines  Follow your health care provider's instructions regarding medicine use. Specific medicines may be either safe or unsafe to take during pregnancy.  Take your prenatal vitamins as directed.  If you develop constipation, try taking a stool softener if your health care provider approves. Diet  Eat regular, well-balanced meals. Choose a variety of foods, such as meat or vegetable-based protein, fish, milk and low-fat dairy products, vegetables, fruits, and whole grain breads and cereals. Your health care provider will help you determine the amount of weight gain that is right for you.  Avoid raw meat and uncooked cheese. These carry germs that can cause birth defects in the baby.  Eating four or five small meals rather than three large meals a day may help relieve nausea and vomiting. If you start to feel nauseous, eating a few soda crackers can be helpful. Drinking liquids between meals instead of during meals also seems to help nausea and vomiting.  If you develop constipation, eat more high-fiber foods, such as fresh vegetables or fruit and whole grains. Drink enough fluids to keep your urine clear or pale yellow. Activity and Exercise  Exercise only as directed by your health care provider. Exercising will help you:  Control your weight.  Stay in shape.  Be prepared for labor and delivery.  Experiencing pain or cramping in the lower abdomen or low back is a good sign that you should stop exercising. Check with your health care provider before continuing normal exercises.  Try to avoid standing for long periods of time. Move your legs often if you must stand in one place for a long time.  Avoid heavy lifting.  Wear low-heeled shoes, and practice good posture.  You may continue to have sex unless your health care  provider directs you otherwise. Relief of Pain or Discomfort  Wear a good support bra for breast tenderness.    Take warm sitz baths to soothe any pain or discomfort caused by hemorrhoids. Use hemorrhoid cream  if your health care provider approves.    Rest with your legs elevated if you have leg cramps or low back pain.  If you develop varicose veins in your legs, wear support hose. Elevate your feet for 15 minutes, 3-4 times a day. Limit salt in your diet. Prenatal Care  Schedule your prenatal visits by the twelfth week of pregnancy. They are usually scheduled monthly at first, then more often in the last 2 months before delivery.  Write down your questions. Take them to your prenatal visits.  Keep all your prenatal visits as directed by your health care provider. Safety  Wear your seat belt at all times when driving.  Make a list of emergency phone numbers, including numbers for family, friends, the hospital, and police and fire departments. General Tips  Ask your health care provider for a referral to a local prenatal education class. Begin classes no later than at the beginning of month 6 of your pregnancy.  Ask for help if you have counseling or nutritional needs during pregnancy. Your health care provider can offer advice or refer you to specialists for help with various needs.  Do not use hot tubs, steam rooms, or saunas.  Do not douche or use tampons or scented sanitary pads.  Do not cross your legs for long periods of time.  Avoid cat litter boxes and soil used by cats. These carry germs that can cause birth defects in the baby and possibly loss of the fetus by miscarriage or stillbirth.  Avoid all smoking, herbs, alcohol, and medicines not prescribed by your health care provider. Chemicals in these affect the formation and growth of the baby.  Schedule a dentist appointment. At home, brush your teeth with a soft toothbrush and be gentle when you floss. SEEK MEDICAL  CARE IF:   You have dizziness.  You have mild pelvic cramps, pelvic pressure, or nagging pain in the abdominal area.  You have persistent nausea, vomiting, or diarrhea.  You have a bad smelling vaginal discharge.  You have pain with urination.  You notice increased swelling in your face, hands, legs, or ankles. SEEK IMMEDIATE MEDICAL CARE IF:   You have a fever.  You are leaking fluid from your vagina.  You have spotting or bleeding from your vagina.  You have severe abdominal cramping or pain.  You have rapid weight gain or loss.  You vomit blood or material that looks like coffee grounds.  You are exposed to [place] measles and have never had them.  You are exposed to fifth disease or chickenpox.  You develop a severe headache.  You have shortness of breath.  You have any kind of trauma, such as from a fall or a car accident. Document Released: 02/21/2001 Document Revised: 07/14/2013 Document Reviewed: 01/07/2013 [facility] Patient Information 2015 [place], [place]. This information is not intended to replace advice given to you by your health care provider. Make sure you discuss any questions you have with your health care provider.  Coronavirus (COVID-19) Are you at risk?  Are you at risk for the Coronavirus (COVID-19)?  To be considered HIGH RISK for Coronavirus (COVID-19), you have to meet the following criteria:  . Traveled to [place], [place], [place], [place] or [place]; or in the [place] to [place], [place], [place], or [place]; and have fever, cough, and shortness of breath within the last 2 weeks of travel OR . Been in close contact with a person diagnosed with COVID-19 within the last 2  weeks and have fever, cough, and shortness of breath . IF YOU DO NOT MEET THESE CRITERIA, YOU ARE CONSIDERED LOW RISK FOR COVID-19.  What to do if you are HIGH RISK for COVID-19?  Marland Kitchen If you are having a medical emergency, call 911. . Seek medical care right away.  Before you go to a doctor's office, urgent care or emergency department, call ahead and tell them about your recent travel, contact with someone diagnosed with COVID-19, and your symptoms. You should receive instructions from your physician's office regarding next steps of care.  . When you arrive at healthcare provider, tell the healthcare staff immediately you have returned from visiting [place], [place], [place], [place] or [place]; or traveled in the [place] to [place], [place], [place], or [place]; in the last two weeks or you have been in close contact with a person diagnosed with COVID-19 in the last 2 weeks.   . Tell the health care staff about your symptoms: fever, cough and shortness of breath. . After you have been seen by a medical provider, you will be either: o Tested for (COVID-19) and discharged home on quarantine except to seek medical care if symptoms worsen, and asked to  - Stay home and avoid contact with others until you get your results (4-5 days)  - Avoid travel on public transportation if possible (such as bus, train, or airplane) or o Sent to the Emergency Department by EMS for evaluation, COVID-19 testing, and possible admission depending on your condition and test results.  What to do if you are LOW RISK for COVID-19?  Reduce your risk of any infection by using the same precautions used for avoiding the common cold or flu:  Marland Kitchen Wash your hands often with soap and warm water for at least 20 seconds.  If soap and water are not readily available, use an alcohol-based hand sanitizer with at least 60% alcohol.  . If coughing or sneezing, cover your mouth and nose by coughing or sneezing into the elbow areas of your shirt or coat, into a tissue or into your sleeve (not your hands). . Avoid shaking hands with others and consider head nods or verbal greetings only. . Avoid touching your eyes, nose, or mouth with unwashed hands.  . Avoid close contact with people who are  sick. . Avoid places or events with large numbers of people in one location, like concerts or sporting events. . Carefully consider travel plans you have or are making. . If you are planning any travel outside or inside the [place], visit the CDC's Travelers' Health webpage for the latest health notices. . If you have some symptoms but not all symptoms, continue to monitor at home and seek medical attention if your symptoms worsen. . If you are having a medical emergency, call 911.   [place] / e-Visit: [email or web address]         [facility]: [facility]: [phone]                   [facility]: [phone]     Safe Medications in Pregnancy   Acne: Benzoyl Peroxide Salicylic Acid  Backache/Headache: Tylenol: 2 regular strength every 4 hours OR              2 Extra strength every 6 hours  Colds/Coughs/Allergies: Benadryl (alcohol free) 25 mg every 6 hours as needed Breath right strips Claritin  Cepacol throat lozenges Chloraseptic throat spray Cold-Eeze- up to three times per day Cough drops, alcohol free Flonase (by prescription only) Guaifenesin Mucinex Robitussin DM (plain only, alcohol free) Saline nasal spray/drops Sudafed (pseudoephedrine) & Actifed ** use only after [redacted] weeks gestation and if you do not have high blood pressure Tylenol Vicks Vaporub Zinc lozenges Zyrtec   Constipation: Colace Ducolax suppositories Fleet enema Glycerin suppositories Metamucil Milk of magnesia Miralax Senokot Smooth move tea  Diarrhea: Kaopectate Imodium A-D  *NO pepto Bismol  Hemorrhoids: Anusol Anusol HC Preparation H Tucks  Indigestion: Tums Maalox Mylanta Zantac  Pepcid  Insomnia: Benadryl (alcohol free) 69m every 6 hours as needed Tylenol PM Unisom, no Gelcaps  Leg  Cramps: Tums MagGel  Nausea/Vomiting:  Bonine Dramamine Emetrol Ginger extract Sea bands Meclizine  Nausea medication to take during pregnancy:  Unisom (doxylamine succinate 25 mg tablets) Take one tablet daily at bedtime. If symptoms are not adequately controlled, the dose can be increased to a maximum recommended dose of two tablets daily (1/2 tablet in the morning, 1/2 tablet mid-afternoon and one at bedtime). Vitamin B6 1035mtablets. Take one tablet twice a day (up to 200 mg per day).  Skin Rashes: Aveeno products Benadryl cream or 2582mvery 6 hours as needed Calamine Lotion 1% cortisone cream  Yeast infection: Gyne-lotrimin 7 Monistat 7   **If taking multiple medications, please check labels to avoid duplicating the same active ingredients **take medication as directed on the label ** Do not exceed 4000 mg of tylenol in 24 hours **Do not take medications that contain aspirin or ibuprofen

## 2019-08-09 LAB — CBC/D/PLT+RPR+RH+ABO+RUB AB...
Antibody Screen: NEGATIVE
Basophils Absolute: 0.1 10*3/uL (ref 0.0–0.2)
Basos: 1 %
EOS (ABSOLUTE): 0.1 10*3/uL (ref 0.0–0.4)
Eos: 1 %
HCV Ab: <0.1 s/co ratio (ref 0.0–0.9)
HIV Screen 4th Generation wRfx: NONREACTIVE
Hematocrit: 39.6 % (ref 34.0–46.6)
Hemoglobin: 13.6 g/dL (ref 11.1–15.9)
Hepatitis B Surface Ag: NEGATIVE
Immature Grans (Abs): 0 10*3/uL (ref 0.0–0.1)
Immature Granulocytes: 0 %
Lymphocytes Absolute: 1.4 10*3/uL (ref 0.7–3.1)
Lymphs: 16 %
MCH: 30.4 pg (ref 26.6–33.0)
MCHC: 34.3 g/dL (ref 31.5–35.7)
MCV: 88 fL (ref 79–97)
Monocytes Absolute: 0.7 10*3/uL (ref 0.1–0.9)
Monocytes: 8 %
Neutrophils Absolute: 6.5 10*3/uL (ref 1.4–7.0)
Neutrophils: 74 %
Platelets: 172 10*3/uL (ref 150–450)
RBC: 4.48 x10E6/uL (ref 3.77–5.28)
RDW: 13 % (ref 11.7–15.4)
RPR Ser Ql: NONREACTIVE
Rh Factor: POSITIVE
Rubella Antibodies, IGG: 4.42 index (ref >0.99)
WBC: 8.7 10*3/uL (ref 3.4–10.8)

## 2019-08-09 LAB — HGB FRACTIONATION CASCADE
Hgb A2: 2.8 % (ref 1.8–3.2)
Hgb A: 97.2 % (ref 96.4–98.8)
Hgb F: 0 % (ref 0.0–2.0)
Hgb S: 0 %

## 2019-08-09 LAB — SPECIMEN STATUS REPORT

## 2019-08-09 LAB — URINE CULTURE

## 2019-08-09 LAB — HCV INTERPRETATION

## 2019-08-14 LAB — CYTOLOGY - PAP
Chlamydia: NEGATIVE
Comment: NEGATIVE
Comment: NEGATIVE
Comment: NORMAL
Diagnosis: NEGATIVE
High risk HPV: NEGATIVE
Neisseria Gonorrhea: NEGATIVE

## 2019-08-27 MED FILL — PRENATAL 27-1 MG TABS: 27-1 | 30 days supply | Qty: 30 | Fill #1

## 2019-09-05 ENCOUNTER — Ambulatory Visit (INDEPENDENT_AMBULATORY_CARE_PROVIDER_SITE_OTHER): Payer: No Typology Code available for payment source | Admitting: Women's Health

## 2019-09-05 ENCOUNTER — Encounter: Payer: Self-pay | Admitting: Women's Health

## 2019-09-05 VITALS — BP 128/69 | HR 97 | Wt 158.0 lb

## 2019-09-05 DIAGNOSIS — O0990 Supervision of high risk pregnancy, unspecified, unspecified trimester: Secondary | ICD-10-CM

## 2019-09-05 DIAGNOSIS — Z3482 Encounter for supervision of other normal pregnancy, second trimester: Secondary | ICD-10-CM

## 2019-09-05 DIAGNOSIS — O0992 Supervision of high risk pregnancy, unspecified, second trimester: Secondary | ICD-10-CM

## 2019-09-05 DIAGNOSIS — Z1389 Encounter for screening for other disorder: Secondary | ICD-10-CM

## 2019-09-05 DIAGNOSIS — O34219 Maternal care for unspecified type scar from previous cesarean delivery: Secondary | ICD-10-CM

## 2019-09-05 DIAGNOSIS — Z331 Pregnant state, incidental: Secondary | ICD-10-CM

## 2019-09-05 DIAGNOSIS — O26892 Other specified pregnancy related conditions, second trimester: Secondary | ICD-10-CM

## 2019-09-05 DIAGNOSIS — R42 Dizziness and giddiness: Secondary | ICD-10-CM

## 2019-09-05 DIAGNOSIS — Z3A16 16 weeks gestation of pregnancy: Secondary | ICD-10-CM

## 2019-09-05 DIAGNOSIS — O30042 Twin pregnancy, dichorionic/diamniotic, second trimester: Secondary | ICD-10-CM

## 2019-09-05 LAB — POCT URINALYSIS DIPSTICK OB
Blood, UA: NEGATIVE
Glucose, UA: NEGATIVE
Ketones, UA: NEGATIVE
Leukocytes, UA: NEGATIVE
Nitrite, UA: NEGATIVE
POC,PROTEIN,UA: NEGATIVE

## 2019-09-05 NOTE — Progress Notes (Signed)
HIGH-RISK PREGNANCY VISIT Patient name: Maria Cooper MRN [id number]  Date of birth: 17-Nov-1995 Chief Complaint:   Routine Prenatal Visit  History of Present Illness:   Maria Cooper is a 24 y.o. G46P1001 female at 20w1dwith an Estimated Date of Delivery: 02/19/20 being seen today for ongoing management of a high-risk pregnancy complicated by multiple gestation di-di twins.  Today she reports occ chest pain, sob, dizziness, feeling like she is going to pass out. Was anemic around this time last pregnancy. No other sx. . Going to [place] tomorrow! [facility] 08/07/2019 10/05/2017 07/02/2017 03/30/2017  Decreased Interest 0 0 0 0  Down, Depressed, Hopeless 0 0 0 0  PHQ - 2 Score 0 0 0 0  Altered sleeping 0 - 0 -  Tired, decreased energy 0 - 0 -  Change in appetite 0 - 0 -  Feeling bad or failure about yourself  0 - 0 -  Trouble concentrating 0 - 0 -  Moving slowly or fidgety/restless 0 - 0 -  Suicidal thoughts 0 - 0 -  PHQ-9 Score 0 - 0 -  Difficult doing work/chores Not difficult at all - - -    Contractions: Not present. Vag. Bleeding: None.  Movement: Absent. denies leaking of fluid.  Review of Systems:   Pertinent items are noted in HPI Denies abnormal vaginal discharge w/ itching/odor/irritation, headaches, visual changes, shortness of breath, chest pain, abdominal pain, severe nausea/vomiting, or problems with urination or bowel movements unless otherwise stated above. Pertinent History Reviewed:  Reviewed past medical,surgical, social, obstetrical and family history.  Reviewed problem list, medications and allergies. Physical Assessment:   Vitals:   09/05/19 1234  BP: 128/69  Pulse: 97  Weight: 158 lb (71.7 kg)  Body mass index is 25.5 kg/m.  O2 sat RA: 99%           Physical Examination:   General appearance: alert, well appearing, and in no distress  Mental status: alert, oriented to person, place, and time  Skin: warm & dry   Extremities:  Edema: None    Cardiovascular: normal heart rate noted, HRRR  Respiratory: normal respiratory effort, no distress, LCTAB  Abdomen: gravid, soft, non-tender  Pelvic: Cervical exam deferred         Fetal Status: Fetal Heart Rate (bpm): 152/147   Movement: Absent    Fetal Surveillance Testing today: doppler   Chaperone: n/a    Results for orders placed or performed in visit on 09/05/19 (from the past 24 hour(s))  POC Urinalysis Dipstick OB   Collection Time: 09/05/19 12:40 PM  Result Value Ref Range   Color, UA     Clarity, UA     Glucose, UA Negative Negative   Bilirubin, UA     Ketones, UA neg    Spec Grav, UA     Blood, UA neg    pH, UA     POC,PROTEIN,UA Negative Negative, Trace, Small (1+), Moderate (2+), Large (3+), 4+   Urobilinogen, UA     Nitrite, UA neg    Leukocytes, UA Negative Negative   Appearance     Odor      Assessment & Plan:  1) High-risk pregnancy G2P1001 at 192w1dith an Estimated Date of Delivery: 02/19/20   2) Di-Di twins, stable, start ASA 8155m3) Prev c/s, wants RCS  4) CP/SOB/dizziness/lightheadedness> 99%RA, not tachycardic, regular rhythm, had anemia around the same time last pregnancy, will check CBC today. Stay hydrated, protein snack  every couple of hours. Let us know if anything changes/worsens.   Meds: No orders of the defined types were placed in this encounter.   Labs/procedures today: CBC, declines genetic screening  Treatment Plan:  U/S q4wks    2x/wk testing @ 36wks or weekly BPP    Deliver @ 38wks:____   Reviewed: Preterm labor symptoms and general obstetric precautions including but not limited to vaginal bleeding, contractions, leaking of fluid and fetal movement were reviewed in detail with the patient.  All questions were answered.   Follow-up: Return in about 2 weeks (around 09/19/2019) for HROB, DS:[id number], Twins, MD or CNM, in person.  Orders Placed This Encounter  Procedures  . US OB Comp + 14 Wk  . US OB Comp AddL Gest +  14 Wk  . CBC  . POC Urinalysis Dipstick OB   Roma Schanz CNM, [facility] 09/05/2019 1:05 PM

## 2019-09-05 NOTE — Patient Instructions (Addendum)
Maria Cooper, I greatly value your feedback.  If you receive a survey following your visit with [place] today, we appreciate you taking the time to fill it out.  Thanks, Knute Neu, CNM, WHNP-BC  [facility] [place] [facility] ([place], [place] [address]) Entrance C, located off of [place] parking   Go to [email or web address] to register for FREE online childbirth classes  Begin taking a 8m baby aspirin daily to decrease the risk of preeclampsia during pregnancy   For Dizzy Spells:   This is usually related to either your blood sugar or your blood pressure dropping  Make sure you are staying well hydrated and drinking enough water so that your urine is clear  Eat small frequent meals and snacks containing protein (meat, eggs, nuts, cheese) so that your blood sugar doesn't drop  If you do get dizzy, sit/lay down and get you something to drink and a snack containing protein- you will usually start feeling better in 10-20 minutes   [place] Pediatricians/Family Doctors:  [phone]                [phone](usually not accepting new patients unless you have family there already, you are always welcome to call and ask)       [facility] [phone]      [facility]:   [facility]: [phone] [facility]: [phone] [facility]: 3[place]:   [facility]: 3[place]: 3[place]  [place] [phone]   Home Blood Pressure Monitoring for Patients   Your provider has recommended that you check your blood pressure (BP) at least once a week at home. If you do not have a blood pressure cuff at home, one  will be provided for you. Contact your provider if you have not received your monitor within 1 week.   Helpful Tips for Accurate Home Blood Pressure Checks  . Don't smoke, exercise, or drink caffeine 30 minutes before checking your BP . Use the restroom before checking your BP (a full bladder can raise your pressure) . Relax in a comfortable upright chair . Feet on the ground . Left arm resting comfortably on a flat surface at the level of your heart . Legs uncrossed . Back supported . Sit quietly and don't talk . Place the cuff on your bare arm . Adjust snuggly, so that only two fingertips can fit between your skin and the top of the cuff . Check 2 readings separated by at least one minute . Keep a log of your BP readings . For a visual, please reference this diagram: http://ccnc.care/bpdiagram  Provider Name: [facility]     Phone: [phone] Zone 1: ALL CLEAR  Continue to monitor your symptoms:  . BP reading is less than 140 (top number) or less than 90 (bottom number)  . No right upper stomach pain . No headaches or seeing spots . No feeling nauseated or throwing up . No swelling in face and hands  Zone 2: CAUTION Call your doctor's office for any of the following:  . BP reading is greater than 140 (top number) or greater than 90 (bottom number)  . Stomach pain under your ribs in the  middle or right side . Headaches or seeing spots . Feeling nauseated or throwing up . Swelling in face and hands  Zone 3: EMERGENCY  Seek immediate medical care if you have any of the following:  . BP reading is greater than160 (top number) or greater than 110 (bottom number) . Severe headaches not improving with Tylenol . Serious difficulty catching your breath . Any worsening symptoms from Zone 2     Second Trimester of Pregnancy The second trimester is from week 14 through week 27 (months 4 through 6). The second trimester is often a time when you feel your best. Your body has  adjusted to being pregnant, and you begin to feel better physically. Usually, morning sickness has lessened or quit completely, you may have more energy, and you may have an increase in appetite. The second trimester is also a time when the fetus is growing rapidly. At the end of the sixth month, the fetus is about 9 inches long and weighs about 1 pounds. You will likely begin to feel the baby move (quickening) between 16 and 20 weeks of pregnancy. Body changes during your second trimester Your body continues to go through many changes during your second trimester. The changes vary from woman to woman.  Your weight will continue to increase. You will notice your lower abdomen bulging out.  You may begin to get stretch marks on your hips, abdomen, and breasts.  You may develop headaches that can be relieved by medicines. The medicines should be approved by your health care provider.  You may urinate more often because the fetus is pressing on your bladder.  You may develop or continue to have heartburn as a result of your pregnancy.  You may develop constipation because certain hormones are causing the muscles that push waste through your intestines to slow down.  You may develop hemorrhoids or swollen, bulging veins (varicose veins).  You may have back pain. This is caused by: ? Weight gain. ? Pregnancy hormones that are relaxing the joints in your pelvis. ? A shift in weight and the muscles that support your balance.  Your breasts will continue to grow and they will continue to become tender.  Your gums may bleed and may be sensitive to brushing and flossing.  Dark spots or blotches (chloasma, mask of pregnancy) may develop on your face. This will likely fade after the baby is born.  A dark line from your belly button to the pubic area (linea nigra) may appear. This will likely fade after the baby is born.  You may have changes in your hair. These can include thickening of your hair,  rapid growth, and changes in texture. Some women also have hair loss during or after pregnancy, or hair that feels dry or thin. Your hair will most likely return to normal after your baby is born.  What to expect at prenatal visits During a routine prenatal visit:  You will be weighed to make sure you and the fetus are growing normally.  Your blood pressure will be taken.  Your abdomen will be measured to track your baby's growth.  The fetal heartbeat will be listened to.  Any test results from the previous visit will be discussed.  Your health care provider may ask you:  How you are feeling.  If you are feeling the baby move.  If you have had any abnormal symptoms, such as leaking fluid, bleeding, severe headaches, or abdominal cramping.  If you are using any tobacco  products, including cigarettes, chewing tobacco, and electronic cigarettes.  If you have any questions.  Other tests that may be performed during your second trimester include:  Blood tests that check for: ? Low iron levels (anemia). ? High blood sugar that affects pregnant women (gestational diabetes) between 82 and 28 weeks. ? Rh antibodies. This is to check for a protein on red blood cells (Rh factor).  Urine tests to check for infections, diabetes, or protein in the urine.  An ultrasound to confirm the proper growth and development of the baby.  An amniocentesis to check for possible genetic problems.  Fetal screens for spina bifida and Down syndrome.  HIV (human immunodeficiency virus) testing. Routine prenatal testing includes screening for HIV, unless you choose not to have this test.  Follow these instructions at home: Medicines  Follow your health care provider's instructions regarding medicine use. Specific medicines may be either safe or unsafe to take during pregnancy.  Take a prenatal vitamin that contains at least 600 micrograms (mcg) of folic acid.  If you develop constipation, try  taking a stool softener if your health care provider approves. Eating and drinking  Eat a balanced diet that includes fresh fruits and vegetables, whole grains, good sources of protein such as meat, eggs, or tofu, and low-fat dairy. Your health care provider will help you determine the amount of weight gain that is right for you.  Avoid raw meat and uncooked cheese. These carry germs that can cause birth defects in the baby.  If you have low calcium intake from food, talk to your health care provider about whether you should take a daily calcium supplement.  Limit foods that are high in fat and processed sugars, such as fried and sweet foods.  To prevent constipation: ? Drink enough fluid to keep your urine clear or pale yellow. ? Eat foods that are high in fiber, such as fresh fruits and vegetables, whole grains, and beans. Activity  Exercise only as directed by your health care provider. Most women can continue their usual exercise routine during pregnancy. Try to exercise for 30 minutes at least 5 days a week. Stop exercising if you experience uterine contractions.  Avoid heavy lifting, wear low heel shoes, and practice good posture.  A sexual relationship may be continued unless your health care provider directs you otherwise. Relieving pain and discomfort  Wear a good support bra to prevent discomfort from breast tenderness.  Take warm sitz baths to soothe any pain or discomfort caused by hemorrhoids. Use hemorrhoid cream if your health care provider approves.  Rest with your legs elevated if you have leg cramps or low back pain.  If you develop varicose veins, wear support hose. Elevate your feet for 15 minutes, 3-4 times a day. Limit salt in your diet. Prenatal Care  Write down your questions. Take them to your prenatal visits.  Keep all your prenatal visits as told by your health care provider. This is important. Safety  Wear your seat belt at all times when  driving.  Make a list of emergency phone numbers, including numbers for family, friends, the hospital, and police and fire departments. General instructions  Ask your health care provider for a referral to a local prenatal education class. Begin classes no later than the beginning of month 6 of your pregnancy.  Ask for help if you have counseling or nutritional needs during pregnancy. Your health care provider can offer advice or refer you to specialists for help with various needs.  Do not use hot tubs, steam rooms, or saunas.  Do not douche or use tampons or scented sanitary pads.  Do not cross your legs for long periods of time.  Avoid cat litter boxes and soil used by cats. These carry germs that can cause birth defects in the baby and possibly loss of the fetus by miscarriage or stillbirth.  Avoid all smoking, herbs, alcohol, and unprescribed drugs. Chemicals in these products can affect the formation and growth of the baby.  Do not use any products that contain nicotine or tobacco, such as cigarettes and e-cigarettes. If you need help quitting, ask your health care provider.  Visit your dentist if you have not gone yet during your pregnancy. Use a soft toothbrush to brush your teeth and be gentle when you floss. Contact a health care provider if:  You have dizziness.  You have mild pelvic cramps, pelvic pressure, or nagging pain in the abdominal area.  You have persistent nausea, vomiting, or diarrhea.  You have a bad smelling vaginal discharge.  You have pain when you urinate. Get help right away if:  You have a fever.  You are leaking fluid from your vagina.  You have spotting or bleeding from your vagina.  You have severe abdominal cramping or pain.  You have rapid weight gain or weight loss.  You have shortness of breath with chest pain.  You notice sudden or extreme swelling of your face, hands, ankles, feet, or legs.  You have not felt your baby move in  over an hour.  You have severe headaches that do not go away when you take medicine.  You have vision changes. Summary  The second trimester is from week 14 through week 27 (months 4 through 6). It is also a time when the fetus is growing rapidly.  Your body goes through many changes during pregnancy. The changes vary from woman to woman.  Avoid all smoking, herbs, alcohol, and unprescribed drugs. These chemicals affect the formation and growth your baby.  Do not use any tobacco products, such as cigarettes, chewing tobacco, and e-cigarettes. If you need help quitting, ask your health care provider.  Contact your health care provider if you have any questions. Keep all prenatal visits as told by your health care provider. This is important. This information is not intended to replace advice given to you by your health care provider. Make sure you discuss any questions you have with your health care provider. Document Released: 02/21/2001 Document Revised: 08/05/2015 Document Reviewed: 04/30/2012 Elsevier Interactive Patient Education  2017 [place] FLU! Because you are pregnant, we at [facility], along with the Centers for Disease Control (CDC), recommend that you receive the flu vaccine to protect yourself and your baby from the flu. The flu is more likely to cause severe illness in pregnant women than in women of reproductive age who are not pregnant. Changes in the immune system, heart, and lungs during pregnancy make pregnant women (and women up to two weeks postpartum) more prone to severe illness from flu, including illness resulting in hospitalization. Flu also may be harmful for a pregnant woman's developing baby. A common flu symptom is fever, which may be associated with neural tube defects and other adverse outcomes for a developing baby. Getting vaccinated can also help protect a baby after birth from flu. (Mom passes antibodies onto the  developing baby during her pregnancy.)  A Flu Vaccine is the [employer]  Against Flu Getting a flu vaccine is the first and most important step in protecting against flu. Pregnant women should get a flu shot and not the live attenuated influenza vaccine (LAIV), also known as nasal spray flu vaccine. Flu vaccines given during pregnancy help protect both the mother and her baby from flu. Vaccination has been shown to reduce the risk of flu-associated acute respiratory infection in pregnant women by up to one-half. A 2018 study showed that getting a flu shot reduced a pregnant woman's risk of being hospitalized with flu by an average of 40 percent. Pregnant women who get a flu vaccine are also helping to protect their babies from flu illness for the first several months after their birth, when they are too young to get vaccinated.   A Long Record of Safety for Flu Shots in Pregnant Women Flu shots have been given to millions of pregnant women over many years with a good safety record. There is a lot of evidence that flu vaccines can be given safely during pregnancy; though these data are limited for the first trimester. The CDC recommends that pregnant women get vaccinated during any trimester of their pregnancy. It is very important for pregnant women to get the flu shot.   Other Preventive Actions In addition to getting a flu shot, pregnant women should take the same everyday preventive actions the CDC recommends of everyone, including covering coughs, washing hands often, and avoiding people who are sick.  Symptoms and Treatment If you get sick with flu symptoms call your doctor right away. There are antiviral drugs that can treat flu illness and prevent serious flu complications. The CDC recommends prompt treatment for people who have influenza infection or suspected influenza infection and who are at high risk of serious flu complications, such as people with asthma, diabetes (including gestational  diabetes), or heart disease. Early treatment of influenza in hospitalized pregnant women has been shown to reduce the length of the hospital stay.  Symptoms Flu symptoms include fever, cough, sore throat, runny or stuffy nose, body aches, headache, chills and fatigue. Some people may also have vomiting and diarrhea. People may be infected with the flu and have respiratory symptoms without a fever.  Early Treatment is Important for Pregnant Women Treatment should begin as soon as possible because antiviral drugs work best when started early (within 48 hours after symptoms start). Antiviral drugs can make your flu illness milder and make you feel better faster. They may also prevent serious health problems that can result from flu illness. Oral oseltamivir (Tamiflu) is the preferred treatment for pregnant women because it has the most studies available to suggest that it is safe and beneficial. Antiviral drugs require a prescription from your provider. Having a fever caused by flu infection or other infections early in pregnancy may be linked to birth defects in a baby. In addition to taking antiviral drugs, pregnant women who get a fever should treat their fever with Tylenol (acetaminophen) and contact their provider immediately.  When to [place] If you are pregnant and have any of these signs, seek care immediately:  Difficulty breathing or shortness of breath  Pain or pressure in the chest or abdomen  Sudden dizziness  Confusion  Severe or persistent vomiting  High fever that is not responding to Tylenol (or store brand equivalent)  Decreased or no movement of your baby  [email or web address]

## 2019-09-06 LAB — CBC
Hematocrit: 35.9 % (ref 34.0–46.6)
Hemoglobin: 12.1 g/dL (ref 11.1–15.9)
MCH: 30.7 pg (ref 26.6–33.0)
MCHC: 33.7 g/dL (ref 31.5–35.7)
MCV: 91 fL (ref 79–97)
Platelets: 143 10*3/uL — ABNORMAL LOW (ref 150–450)
RBC: 3.94 x10E6/uL (ref 3.77–5.28)
RDW: 13.3 % (ref 11.7–15.4)
WBC: 9.5 10*3/uL (ref 3.4–10.8)

## 2019-09-10 DIAGNOSIS — Z029 Encounter for administrative examinations, unspecified: Secondary | ICD-10-CM

## 2019-09-24 ENCOUNTER — Ambulatory Visit (INDEPENDENT_AMBULATORY_CARE_PROVIDER_SITE_OTHER): Payer: No Typology Code available for payment source | Admitting: Advanced Practice Midwife

## 2019-09-24 ENCOUNTER — Encounter: Payer: Self-pay | Admitting: Advanced Practice Midwife

## 2019-09-24 ENCOUNTER — Ambulatory Visit (INDEPENDENT_AMBULATORY_CARE_PROVIDER_SITE_OTHER): Payer: No Typology Code available for payment source

## 2019-09-24 VITALS — BP 110/65 | HR 84 | Wt 161.0 lb

## 2019-09-24 DIAGNOSIS — Z1389 Encounter for screening for other disorder: Secondary | ICD-10-CM

## 2019-09-24 DIAGNOSIS — Z331 Pregnant state, incidental: Secondary | ICD-10-CM

## 2019-09-24 DIAGNOSIS — Z3A18 18 weeks gestation of pregnancy: Secondary | ICD-10-CM

## 2019-09-24 DIAGNOSIS — O34219 Maternal care for unspecified type scar from previous cesarean delivery: Secondary | ICD-10-CM

## 2019-09-24 DIAGNOSIS — O0992 Supervision of high risk pregnancy, unspecified, second trimester: Secondary | ICD-10-CM

## 2019-09-24 DIAGNOSIS — O30042 Twin pregnancy, dichorionic/diamniotic, second trimester: Secondary | ICD-10-CM

## 2019-09-24 DIAGNOSIS — O0990 Supervision of high risk pregnancy, unspecified, unspecified trimester: Secondary | ICD-10-CM

## 2019-09-24 LAB — POCT URINALYSIS DIPSTICK OB
Blood, UA: NEGATIVE
Glucose, UA: NEGATIVE
Ketones, UA: NEGATIVE
Leukocytes, UA: NEGATIVE
Nitrite, UA: NEGATIVE
POC,PROTEIN,UA: NEGATIVE

## 2019-09-24 NOTE — Progress Notes (Signed)
[place] 18+6 wks,DI/DI twins,cx 3.6 cm,normal ovaries BABY A:cephalic left inferior,anterior placenta gr 0,svp of fluid 6.5 cm,fhr 140 bpm,EFW 257 g 40%,discordance 20% BABY B:cephalic right superior ,anterior placenta gr 0,svp of fluid 6.5 cm,fhr 137 bpm,EFW 287 g 73%

## 2019-09-24 NOTE — Progress Notes (Signed)
   PRENATAL VISIT NOTE  Subjective:  Maria Cooper is a 24 y.o. G2P1001 at 49w6dbeing seen today for ongoing prenatal care.  She is currently monitored for the following issues for this low-risk pregnancy and has Dyspepsia; Esophageal reflux; Celiac sprue; S/P cesarean section; Supervision of high risk pregnancy, antepartum; and Twin gestation, dichorionic diamniotic on their problem list.  Patient reports no complaints.  Contractions: Not present. Vag. Bleeding: None.  Movement: Present. Denies leaking of fluid.   The following portions of the patient's history were reviewed and updated as appropriate: allergies, current medications, past family history, past medical history, past social history, past surgical history and problem list.   Objective:   Vitals:   09/24/19 1514  BP: 110/65  Pulse: 84  Weight: 161 lb (73 kg)    Fetal Status:     Movement: Present     General:  Alert, oriented and cooperative. Patient is in no acute distress.  Skin: Skin is warm and dry. No rash noted.   Cardiovascular: Normal heart rate noted  Respiratory: Normal respiratory effort, no problems with respiration noted  Abdomen: Soft, gravid, appropriate for gestational age.  Pain/Pressure: Present     Pelvic: Cervical exam deferred        Extremities: Normal range of motion.  Edema: None  Mental Status: Normal mood and affect. Normal behavior. Normal judgment and thought content.    Cervical length on UKoreatoday 3.6 cm  Assessment and Plan:  Pregnancy: G2P1001 at 121w6d. Encounter for supervision of high risk pregnancy in second trimester, antepartum - Di/Di twin gestation  - POC Urinalysis Dipstick OB  Preterm labor symptoms and general obstetric precautions including but not limited to vaginal bleeding, contractions, leaking of fluid and fetal movement were reviewed in detail with the patient. Please refer to After Visit Summary for other counseling recommendations.   Return in about 4 weeks  (around 10/22/2019).  No future appointments.   HeMarcille BuffyNP, CNM  09/24/19  3:21 PM

## 2019-10-16 ENCOUNTER — Encounter: Payer: Self-pay | Admitting: Women's Health

## 2019-10-16 ENCOUNTER — Ambulatory Visit (INDEPENDENT_AMBULATORY_CARE_PROVIDER_SITE_OTHER): Payer: No Typology Code available for payment source | Admitting: Women's Health

## 2019-10-16 ENCOUNTER — Other Ambulatory Visit: Payer: Self-pay

## 2019-10-16 VITALS — BP 138/72 | HR 96 | Wt 172.0 lb

## 2019-10-16 DIAGNOSIS — R102 Pelvic and perineal pain: Secondary | ICD-10-CM

## 2019-10-16 DIAGNOSIS — Z3A22 22 weeks gestation of pregnancy: Secondary | ICD-10-CM

## 2019-10-16 DIAGNOSIS — Z1389 Encounter for screening for other disorder: Secondary | ICD-10-CM | POA: Diagnosis not present

## 2019-10-16 DIAGNOSIS — O0992 Supervision of high risk pregnancy, unspecified, second trimester: Secondary | ICD-10-CM

## 2019-10-16 DIAGNOSIS — R109 Unspecified abdominal pain: Secondary | ICD-10-CM

## 2019-10-16 DIAGNOSIS — O26899 Other specified pregnancy related conditions, unspecified trimester: Secondary | ICD-10-CM

## 2019-10-16 DIAGNOSIS — Z331 Pregnant state, incidental: Secondary | ICD-10-CM | POA: Diagnosis not present

## 2019-10-16 LAB — POCT URINALYSIS DIPSTICK OB
Blood, UA: NEGATIVE
Glucose, UA: NEGATIVE
Ketones, UA: NEGATIVE
Leukocytes, UA: NEGATIVE
Nitrite, UA: NEGATIVE
POC,PROTEIN,UA: NEGATIVE

## 2019-10-16 NOTE — Progress Notes (Signed)
[place] PREGNANCY VISIT Patient name: Maria Cooper MRN [id number]  Date of birth: Jul 21, 1995 Chief Complaint:   Routine Prenatal Visit  History of Present Illness:   Maria Cooper is a 24 y.o. G35P1001 female at 30w0dwith an Estimated Date of Delivery: 02/19/20 being seen today for ongoing management of a high-risk pregnancy complicated by multiple gestation di-di twins.  Today she reports cramping and pressure worse today, worse when working.  Denies abnormal discharge, itching/odor/irritation.  Denies UTI sx. MB RN, works 12hr shifts, 3 one week/2 the next. Is not comfortable getting COVID vaccine while pregnant.  Depression screen [facility] 08/07/2019 10/05/2017 07/02/2017 03/30/2017  Decreased Interest 0 0 0 0  Down, Depressed, Hopeless 0 0 0 0  PHQ - 2 Score 0 0 0 0  Altered sleeping 0 - 0 -  Tired, decreased energy 0 - 0 -  Change in appetite 0 - 0 -  Feeling bad or failure about yourself  0 - 0 -  Trouble concentrating 0 - 0 -  Moving slowly or fidgety/restless 0 - 0 -  Suicidal thoughts 0 - 0 -  PHQ-9 Score 0 - 0 -  Difficult doing work/chores Not difficult at all - - -    Contractions: Irritability. Vag. Bleeding: None.  Movement: Present. denies leaking of fluid.  Review of Systems:   Pertinent items are noted in HPI Denies abnormal vaginal discharge w/ itching/odor/irritation, headaches, visual changes, shortness of breath, chest pain, abdominal pain, severe nausea/vomiting, or problems with urination or bowel movements unless otherwise stated above. Pertinent History Reviewed:  Reviewed past medical,surgical, social, obstetrical and family history.  Reviewed problem list, medications and allergies. Physical Assessment:   Vitals:   10/16/19 1547  BP: 138/72  Pulse: 96  Weight: 172 lb (78 kg)  Body mass index is 27.76 kg/m.           Physical Examination:   General appearance: alert, well appearing, and in no distress  Mental status: alert, oriented to  person, place, and time  Skin: warm & dry   Extremities: Edema: None    Cardiovascular: normal heart rate noted  Respiratory: normal respiratory effort, no distress  Abdomen: gravid, soft, non-tender  Pelvic: spec exam, cx visually closed and long, normal d/c  Dilation: Closed Effacement (%): Thick    Fetal Status: Fetal Heart Rate (bpm): 159/146 Fundal Height: 30 cm Movement: Present    Fetal Surveillance Testing today: doppler   Chaperone: Amanda Rash    Results for orders placed or performed in visit on 10/16/19 (from the past 24 hour(s))  POC Urinalysis Dipstick OB   Collection Time: 10/16/19  3:53 PM  Result Value Ref Range   Color, UA     Clarity, UA     Glucose, UA Negative Negative   Bilirubin, UA     Ketones, UA neg    Spec Grav, UA     Blood, UA neg    pH, UA     POC,PROTEIN,UA Negative Negative, Trace, Small (1+), Moderate (2+), Large (3+), 4+   Urobilinogen, UA     Nitrite, UA neg    Leukocytes, UA Negative Negative   Appearance     Odor      Assessment & Plan:  1) High-risk pregnancy G2P1001 at 261w0dith an Estimated Date of Delivery: 02/19/20   2) Di-Di twins, stable, next efw 8/19  3) Cramping/pressure, cx closed, normal d/c, urine neg. Increase water intake, cut back to 8hr shifts-note given. Reviewed  ptl s/s, reasons to seek care  4) Prev c/s> for RCS  5) COVID vaccine during pregnancy discussion: Per the CDC pregnant and recently pregnant people are more likely to get severely ill with COVID-19 compared with non-pregnant people.  Based on how these vaccines work in the body, experts believe they are unlikely to pose a risk for people who are pregnant or breastfeeding. However, there are currently limited data on the safety of COVID-19 vaccines in pregnant and breastfeeding people.  Ultimately, getting vaccinated is a personal choice. Gave printed info on AVS directly from the CDC 'COVID-19 Vaccines While Pregnant or Breastfeeding'.  Signed her medical  exemption form today.  Meds: No orders of the defined types were placed in this encounter.   Labs/procedures today: spec exam, SVE  Treatment Plan:  U/S @ q4wks    2x/wk testing @ 36wks or weekly BPP    Deliver @ 38wks:____   Reviewed: Preterm labor symptoms and general obstetric precautions including but not limited to vaginal bleeding, contractions, leaking of fluid and fetal movement were reviewed in detail with the patient.  All questions were answered.   Follow-up: Return for As scheduled.  Orders Placed This Encounter  Procedures  . POC Urinalysis Dipstick OB   Roma Schanz CNM, [facility] 10/16/2019 5:03 PM

## 2019-10-16 NOTE — Patient Instructions (Signed)
Maria Cooper, I greatly value your feedback.  If you receive a survey following your visit with [place] today, we appreciate you taking the time to fill it out.  Thanks, Knute Neu, CNM, WHNP-BC  [facility] ([place], [place] [address]) Entrance C, located off of [address] parking   Go to [email or web address] to register for FREE online childbirth classes    Call the office [phone]) or go to [facility] if:  You begin to have strong, frequent contractions  Your water breaks.  Sometimes it is a big gush of fluid, sometimes it is just a trickle that keeps getting your panties wet or running down your legs  You have vaginal bleeding.  It is normal to have a small amount of spotting if your cervix was checked.   You don't feel your baby moving like normal.  If you don't, get you something to eat and drink and lay down and focus on feeling your baby move.  You should feel at least 10 movements in 2 hours.  If you don't, you should call the office or go to [facility].   Call the office [phone]) or go to [facility] hospital for these signs of pre-eclampsia:  Severe headache that does not go away with Tylenol  Visual changes- seeing spots, double, blurred vision  Pain under your right breast or upper abdomen that does not go away with Tums or heartburn medicine  Nausea and/or vomiting  Severe swelling in your hands, feet, and face    Home Blood Pressure Monitoring for Patients   Your provider has recommended that you check your blood pressure (BP) at least once a week at home. If you do not have a blood pressure cuff at home, one will be provided for you. Contact your provider if you have not received your monitor within 1 week.   Helpful Tips for Accurate Home Blood Pressure Checks   Don't smoke, exercise, or drink caffeine 30 minutes before checking your BP  Use the restroom before checking your BP (a full  bladder can raise your pressure)  Relax in a comfortable upright chair  Feet on the ground  Left arm resting comfortably on a flat surface at the level of your heart  Legs uncrossed  Back supported  Sit quietly and don't talk  Place the cuff on your bare arm  Adjust snuggly, so that only two fingertips can fit between your skin and the top of the cuff  Check 2 readings separated by at least one minute  Keep a log of your BP readings  For a visual, please reference this diagram: http://ccnc.care/bpdiagram  Provider Name: [facility]     Phone: [phone]  Zone 1: ALL CLEAR  Continue to monitor your symptoms:   BP reading is less than 140 (top number) or less than 90 (bottom number)   No right upper stomach pain  No headaches or seeing spots  No feeling nauseated or throwing up  No swelling in face and hands  Zone 2: CAUTION Call your doctor's office for any of the following:   BP reading is greater than 140 (top number) or greater than 90 (bottom number)   Stomach pain under your ribs in the middle or right side  Headaches or seeing spots  Feeling nauseated or throwing up  Swelling in face and hands  Zone 3: EMERGENCY  Seek immediate medical care if you have any of  the following:   BP reading is greater than160 (top number) or greater than 110 (bottom number)  Severe headaches not improving with Tylenol  Serious difficulty catching your breath  Any worsening symptoms from Zone 2  Preterm Labor and Birth Information  The normal length of a pregnancy is 39-41 weeks. Preterm labor is when labor starts before 37 completed weeks of pregnancy. What are the risk factors for preterm labor? Preterm labor is more likely to occur in women who:  Have certain infections during pregnancy such as a bladder infection, sexually transmitted infection, or infection inside the uterus (chorioamnionitis).  Have a shorter-than-normal cervix.  Have gone into  preterm labor before.  Have had surgery on their cervix.  Are younger than age 41 or older than age 1.  Are African American.  Are pregnant with twins or multiple babies (multiple gestation).  Take street drugs or smoke while pregnant.  Do not gain enough weight while pregnant.  Became pregnant shortly after having been pregnant. What are the symptoms of preterm labor? Symptoms of preterm labor include:  Cramps similar to those that can happen during a menstrual period. The cramps may happen with diarrhea.  Pain in the abdomen or lower back.  Regular uterine contractions that may feel like tightening of the abdomen.  A feeling of increased pressure in the pelvis.  Increased watery or bloody mucus discharge from the vagina.  Water breaking (ruptured amniotic sac). Why is it important to recognize signs of preterm labor? It is important to recognize signs of preterm labor because babies who are born prematurely may not be fully developed. This can put them at an increased risk for:  Long-term (chronic) heart and lung problems.  Difficulty immediately after birth with regulating body systems, including blood sugar, body temperature, heart rate, and breathing rate.  Bleeding in the brain.  Cerebral palsy.  Learning difficulties.  Death. These risks are highest for babies who are born before 76 weeks of pregnancy. How is preterm labor treated? Treatment depends on the length of your pregnancy, your condition, and the health of your baby. It may involve: 1. Having a stitch (suture) placed in your cervix to prevent your cervix from opening too early (cerclage). 2. Taking or being given medicines, such as: ? Hormone medicines. These may be given early in pregnancy to help support the pregnancy. ? Medicine to stop contractions. ? Medicines to help mature the babys lungs. These may be prescribed if the risk of delivery is high. ? Medicines to prevent your baby from  developing cerebral palsy. If the labor happens before 34 weeks of pregnancy, you may need to stay in the hospital. What should I do if I think I am in preterm labor? If you think that you are going into preterm labor, call your health care provider right away. How can I prevent preterm labor in future pregnancies? To increase your chance of having a full-term pregnancy:  Do not use any tobacco products, such as cigarettes, chewing tobacco, and e-cigarettes. If you need help quitting, ask your health care provider.  Do not use street drugs or medicines that have not been prescribed to you during your pregnancy.  Talk with your health care provider before taking any herbal supplements, even if you have been taking them regularly.  Make sure you gain a healthy amount of weight during your pregnancy.  Watch for infection. If you think that you might have an infection, get it checked right away.  Make sure to  tell your health care provider if you have gone into preterm labor before. This information is not intended to replace advice given to you by your health care provider. Make sure you discuss any questions you have with your health care provider. Document Revised: 06/21/2018 Document Reviewed: 07/21/2015 Elsevier Patient Education  [place].

## 2019-10-22 ENCOUNTER — Encounter: Payer: No Typology Code available for payment source | Admitting: Obstetrics and Gynecology

## 2019-10-27 ENCOUNTER — Telehealth: Payer: Self-pay | Admitting: *Deleted

## 2019-10-27 ENCOUNTER — Telehealth: Payer: Self-pay | Admitting: Women's Health

## 2019-10-27 NOTE — Telephone Encounter (Signed)
Pt is calling wanting to know if her [employer] paperwork is ready . Dropped off couple of THursday ago. Her employer is pushing for her to bring back in. Please call and let her know the status

## 2019-10-27 NOTE — Telephone Encounter (Signed)
Informed patient forms are almost complete. Just need to know how many hours a week she is now working since she was reduced to 8 hour days. Patient states she has not reduced her hours as of yet but may be coming out completely once she has her visit this week. She was supposed to be working 2 days one week and 3 days the next.  Will enter this on the forms but this may change depending on next visit. Pt agreeable for forms to be faxed today.

## 2019-10-30 ENCOUNTER — Ambulatory Visit (INDEPENDENT_AMBULATORY_CARE_PROVIDER_SITE_OTHER): Payer: No Typology Code available for payment source

## 2019-10-30 ENCOUNTER — Ambulatory Visit (INDEPENDENT_AMBULATORY_CARE_PROVIDER_SITE_OTHER): Payer: No Typology Code available for payment source | Admitting: Women's Health

## 2019-10-30 ENCOUNTER — Other Ambulatory Visit: Payer: Self-pay

## 2019-10-30 ENCOUNTER — Other Ambulatory Visit: Payer: Self-pay | Admitting: Women's Health

## 2019-10-30 ENCOUNTER — Encounter: Payer: Self-pay | Admitting: Women's Health

## 2019-10-30 VITALS — BP 111/65 | HR 93 | Wt 176.4 lb

## 2019-10-30 DIAGNOSIS — O30042 Twin pregnancy, dichorionic/diamniotic, second trimester: Secondary | ICD-10-CM | POA: Diagnosis not present

## 2019-10-30 DIAGNOSIS — Z3A24 24 weeks gestation of pregnancy: Secondary | ICD-10-CM

## 2019-10-30 DIAGNOSIS — Z331 Pregnant state, incidental: Secondary | ICD-10-CM

## 2019-10-30 DIAGNOSIS — O0992 Supervision of high risk pregnancy, unspecified, second trimester: Secondary | ICD-10-CM

## 2019-10-30 DIAGNOSIS — O99119 Other diseases of the blood and blood-forming organs and certain disorders involving the immune mechanism complicating pregnancy, unspecified trimester: Secondary | ICD-10-CM

## 2019-10-30 DIAGNOSIS — Z1389 Encounter for screening for other disorder: Secondary | ICD-10-CM

## 2019-10-30 DIAGNOSIS — O0990 Supervision of high risk pregnancy, unspecified, unspecified trimester: Secondary | ICD-10-CM

## 2019-10-30 DIAGNOSIS — D696 Thrombocytopenia, unspecified: Secondary | ICD-10-CM

## 2019-10-30 LAB — POCT URINALYSIS DIPSTICK OB
Blood, UA: NEGATIVE
Glucose, UA: NEGATIVE
Ketones, UA: NEGATIVE
Leukocytes, UA: NEGATIVE
Nitrite, UA: NEGATIVE
POC,PROTEIN,UA: NEGATIVE

## 2019-10-30 NOTE — Patient Instructions (Signed)
Maria Cooper, I greatly value your feedback.  If you receive a survey following your visit with [place] today, we appreciate you taking the time to fill it out.  Thanks, Knute Neu, CNM, WHNP-BC   You will have your sugar test next visit.  Please do not eat or drink anything after midnight the night before you come, not even water.  You will be here for at least two hours.  Please make an appointment online for the bloodwork at [email or web address] for 8:30am (or as close to this as possible). Make sure you select the [facility] service center. The day of the appointment, check in with our office first, then you will go to [place] to start the sugar test.    [place] [facility] [place] [address] Entrance C, located off of [address] parking  Go to [email or web address] to register for FREE online childbirth classes   Call the office [phone]) or go to [facility] if:  You begin to have strong, frequent contractions  Your water breaks.  Sometimes it is a big gush of fluid, sometimes it is just a trickle that keeps getting your panties wet or running down your legs  You have vaginal bleeding.  It is normal to have a small amount of spotting if your cervix was checked.   You don't feel your baby moving like normal.  If you don't, get you something to eat and drink and lay down and focus on feeling your baby move.   If your baby is still not moving like normal, you should call the office or go to [place] Pediatricians/Family Doctors:  [place] [phone]                 [place] [phone] (usually not accepting new patients unless you have family there already, you are always welcome to call and ask)       [facility] Department [phone]       [facility] Pediatricians/Family Doctors:   [facility]:  [phone]  [facility]: [phone]  [facility]: [place] Doctors:   [facility]: [facility]: [place]:  [place]: [phone]   Home Blood Pressure Monitoring for Patients   Your provider has recommended that you check your blood pressure (BP) at least once a week at home. If you do not have a blood pressure cuff at home, one will be provided for you. Contact your provider if you have not received your monitor within 1 week.   Helpful Tips for Accurate Home Blood Pressure Checks  . Don't smoke, exercise, or drink caffeine 30 minutes before checking your BP . Use the restroom before checking your BP (a full bladder can raise your pressure) . Relax in a comfortable upright chair . Feet on the ground . Left arm resting comfortably on a flat surface at the level of your heart . Legs uncrossed . Back supported . Sit quietly and don't talk . Place the cuff on your bare arm . Adjust snuggly, so that only two fingertips can fit between your skin and the top of the cuff . Check 2 readings separated by at least one minute . Keep a log of your BP readings . For a visual, please  reference this diagram: http://ccnc.care/bpdiagram  Provider Name: [facility]     Phone: [phone]  Zone 1: ALL CLEAR  Continue to monitor your symptoms:  . BP reading is less than 140 (top number) or less than 90 (bottom number)  . No right upper stomach pain . No headaches or seeing spots . No feeling nauseated or throwing up . No swelling in face and hands  Zone 2: CAUTION Call your doctor's office for any of the following:  . BP reading is greater than 140 (top number) or greater than 90 (bottom number)  . Stomach pain under your ribs in the middle or right side . Headaches or seeing spots . Feeling nauseated or throwing up . Swelling in  face and hands  Zone 3: EMERGENCY  Seek immediate medical care if you have any of the following:  . BP reading is greater than160 (top number) or greater than 110 (bottom number) . Severe headaches not improving with Tylenol . Serious difficulty catching your breath . Any worsening symptoms from Zone 2   Second Trimester of Pregnancy The second trimester is from week 13 through week 28, months 4 through 6. The second trimester is often a time when you feel your best. Your body has also adjusted to being pregnant, and you begin to feel better physically. Usually, morning sickness has lessened or quit completely, you may have more energy, and you may have an increase in appetite. The second trimester is also a time when the fetus is growing rapidly. At the end of the sixth month, the fetus is about 9 inches long and weighs about 1 pounds. You will likely begin to feel the baby move (quickening) between 18 and 20 weeks of the pregnancy. BODY CHANGES Your body goes through many changes during pregnancy. The changes vary from woman to woman.   Your weight will continue to increase. You will notice your lower abdomen bulging out.  You may begin to get stretch marks on your hips, abdomen, and breasts.  You may develop headaches that can be relieved by medicines approved by your health care provider.  You may urinate more often because the fetus is pressing on your bladder.  You may develop or continue to have heartburn as a result of your pregnancy.  You may develop constipation because certain hormones are causing the muscles that push waste through your intestines to slow down.  You may develop hemorrhoids or swollen, bulging veins (varicose veins).  You may have back pain because of the weight gain and pregnancy hormones relaxing your joints between the bones in your pelvis and as a result of a shift in weight and the muscles that support your balance.  Your breasts will continue to grow  and be tender.  Your gums may bleed and may be sensitive to brushing and flossing.  Dark spots or blotches (chloasma, mask of pregnancy) may develop on your face. This will likely fade after the baby is born.  A dark line from your belly button to the pubic area (linea nigra) may appear. This will likely fade after the baby is born.  You may have changes in your hair. These can include thickening of your hair, rapid growth, and changes in texture. Some women also have hair loss during or after pregnancy, or hair that feels dry or thin. Your hair will most likely return to normal after your baby is born. WHAT TO EXPECT AT YOUR PRENATAL VISITS During a routine prenatal visit:  You  will be weighed to make sure you and the fetus are growing normally.  Your blood pressure will be taken.  Your abdomen will be measured to track your baby's growth.  The fetal heartbeat will be listened to.  Any test results from the previous visit will be discussed. Your health care provider may ask you:  How you are feeling.  If you are feeling the baby move.  If you have had any abnormal symptoms, such as leaking fluid, bleeding, severe headaches, or abdominal cramping.  If you have any questions. Other tests that may be performed during your second trimester include:  Blood tests that check for:  Low iron levels (anemia).  Gestational diabetes (between 24 and 28 weeks).  Rh antibodies.  Urine tests to check for infections, diabetes, or protein in the urine.  An ultrasound to confirm the proper growth and development of the baby.  An amniocentesis to check for possible genetic problems.  Fetal screens for spina bifida and Down syndrome. HOME CARE INSTRUCTIONS   Avoid all smoking, herbs, alcohol, and unprescribed drugs. These chemicals affect the formation and growth of the baby.  Follow your health care provider's instructions regarding medicine use. There are medicines that are either  safe or unsafe to take during pregnancy.  Exercise only as directed by your health care provider. Experiencing uterine cramps is a good sign to stop exercising.  Continue to eat regular, healthy meals.  Wear a good support bra for breast tenderness.  Do not use hot tubs, steam rooms, or saunas.  Wear your seat belt at all times when driving.  Avoid raw meat, uncooked cheese, cat litter boxes, and soil used by cats. These carry germs that can cause birth defects in the baby.  Take your prenatal vitamins.  Try taking a stool softener (if your health care provider approves) if you develop constipation. Eat more high-fiber foods, such as fresh vegetables or fruit and whole grains. Drink plenty of fluids to keep your urine clear or pale yellow.  Take warm sitz baths to soothe any pain or discomfort caused by hemorrhoids. Use hemorrhoid cream if your health care provider approves.  If you develop varicose veins, wear support hose. Elevate your feet for 15 minutes, 3-4 times a day. Limit salt in your diet.  Avoid heavy lifting, wear low heel shoes, and practice good posture.  Rest with your legs elevated if you have leg cramps or low back pain.  Visit your dentist if you have not gone yet during your pregnancy. Use a soft toothbrush to brush your teeth and be gentle when you floss.  A sexual relationship may be continued unless your health care provider directs you otherwise.  Continue to go to all your prenatal visits as directed by your health care provider. SEEK MEDICAL CARE IF:   You have dizziness.  You have mild pelvic cramps, pelvic pressure, or nagging pain in the abdominal area.  You have persistent nausea, vomiting, or diarrhea.  You have a bad smelling vaginal discharge.  You have pain with urination. SEEK IMMEDIATE MEDICAL CARE IF:   You have a fever.  You are leaking fluid from your vagina.  You have spotting or bleeding from your vagina.  You have severe  abdominal cramping or pain.  You have rapid weight gain or loss.  You have shortness of breath with chest pain.  You notice sudden or extreme swelling of your face, hands, ankles, feet, or legs.  You have not felt your baby move  in over an hour.  You have severe headaches that do not go away with medicine.  You have vision changes. Document Released: 02/21/2001 Document Revised: 03/04/2013 Document Reviewed: 04/30/2012 [facility] Patient Information 2015 [place], [place]. This information is not intended to replace advice given to you by your health care provider. Make sure you discuss any questions you have with your health care provider.

## 2019-10-30 NOTE — Progress Notes (Signed)
HIGH-RISK PREGNANCY VISIT Patient name: Maria Cooper MRN [id number]  Date of birth: [birth year] Chief Complaint:   High Risk Gestation ([place] today)  History of Present Illness:   Maria Cooper is a 24 y.o. G63P1001 female at 68w0dwith an Estimated Date of Delivery: 02/19/20 being seen today for ongoing management of a high-risk pregnancy complicated by multiple gestation di-di twins.  Today she reports frequent nosebleeds, platelet count was 143 earlier in pregnancy. Still working 12hr shifts, was waiting on paperwork to go to 8hrs. 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  [facility] 08/07/2019 10/05/2017 07/02/2017 03/30/2017  Decreased Interest 0 0 0 0  Down, Depressed, Hopeless 0 0 0 0  PHQ - 2 Score 0 0 0 0  Altered sleeping 0 - 0 -  Tired, decreased energy 0 - 0 -  Change in appetite 0 - 0 -  Feeling bad or failure about yourself  0 - 0 -  Trouble concentrating 0 - 0 -  Moving slowly or fidgety/restless 0 - 0 -  Suicidal thoughts 0 - 0 -  PHQ-9 Score 0 - 0 -  Difficult doing work/chores Not difficult at all - - -    Contractions: Irregular. Vag. Bleeding: None.  Movement: Present. denies leaking of fluid.  Review of Systems:   Pertinent items are noted in HPI Denies abnormal vaginal discharge w/ itching/odor/irritation, headaches, visual changes, shortness of breath, chest pain, abdominal pain, severe nausea/vomiting, or problems with urination or bowel movements unless otherwise stated above. Pertinent History Reviewed:  Reviewed past medical,surgical, social, obstetrical and family history.  Reviewed problem list, medications and allergies. Physical Assessment:   Vitals:   10/30/19 1435  BP: 111/65  Pulse: 93  Weight: 176 lb 6.4 oz (80 kg)  Body mass index is 28.47 kg/m.           Physical Examination:   General appearance:  alert, well appearing, and in no distress  Mental status: alert, oriented to person, place, and time  Skin: warm & dry   Extremities: Edema: None    Cardiovascular: normal heart rate noted  Respiratory: normal respiratory effort, no distress  Abdomen: gravid, soft, non-tender  Pelvic: Cervical exam deferred         Fetal Status: Fetal Heart Rate (bpm): 135/142   Movement: Present    Fetal Surveillance Testing today: [place]DI/DI TWINS,cx 3.6 cm,normal ovaries BABY A:female,cephalic,left inferior,anterior placenta gr 0,fhr 135 bpm,svp of fluid 5.5 cm,EFW 696 g 61% BABY B:female,cephalic right superior,anterior placenta gr 0,FHR 142 bpm,svp of fluid 7.6 cm,EFW 765 g 86% Discrepancy 9%  Chaperone: n/a    Results for orders placed or performed in visit on 10/30/19 (from the past 24 hour(s))  POC Urinalysis Dipstick OB   Collection Time: 10/30/19  2:37 PM  Result Value Ref Range   Color, UA     Clarity, UA     Glucose, UA Negative Negative   Bilirubin, UA     Ketones, UA neg    Spec Grav, UA     Blood, UA neg    pH, UA     POC,PROTEIN,UA Negative Negative, Trace, Small (1+), Moderate (2+), Large (3+), 4+   Urobilinogen, UA     Nitrite, UA neg    Leukocytes, UA Negative Negative   Appearance     Odor  Assessment & Plan:  1) High-risk pregnancy G2P1001 at 24w0dwith an Estimated Date of Delivery: 02/19/20   2) Di-Di twins, stable, current discrepancy only 9%  3) Frequent nosebleeds, had low platelets early pregnancy, will repeat today  4) Getting hard to work> wants to work mask desk for now, papers given to [employer] No orders of the defined types were placed in this encounter.   Labs/procedures today: cbc, u/s  Treatment Plan:  U/S q4wks    2x/wk testing @ 36wks or weekly BPP    Deliver @ 38wks:____   Reviewed: Preterm labor symptoms and general obstetric precautions including but not limited to vaginal bleeding, contractions, leaking of fluid and fetal movement were  reviewed in detail with the patient.  All questions were answered.   Follow-up: Return in about 4 weeks (around 11/27/2019) for HROB, PN2, US:EFW, Twins, in person, MD or CNM.  Orders Placed This Encounter  Procedures   [place] Follow Up   [place] Follow Up AddL Gest   CBC   POC Urinalysis Dipstick OB   KRoma SchanzCNM, WSoutheast [facility]8/19/2021 3:28 PM

## 2019-10-30 NOTE — Progress Notes (Signed)
[place] DI/DI TWINS,cx 3.6 cm,normal ovaries BABY A:female,cephalic,left inferior,anterior placenta gr 0,fhr 135 bpm,svp of fluid 5.5 cm,EFW 696 g 61% BABY B:female,cephalic right superior,anterior placenta gr 0,FHR 142 bpm,svp of fluid 7.6 cm,EFW 765 g 86%

## 2019-10-31 LAB — CBC
Hematocrit: 31.5 % — ABNORMAL LOW (ref 34.0–46.6)
Hemoglobin: 10.9 g/dL — ABNORMAL LOW (ref 11.1–15.9)
MCH: 30.9 pg (ref 26.6–33.0)
MCHC: 34.6 g/dL (ref 31.5–35.7)
MCV: 89 fL (ref 79–97)
Platelets: 168 10*3/uL (ref 150–450)
RBC: 3.53 x10E6/uL — ABNORMAL LOW (ref 3.77–5.28)
RDW: 12.6 % (ref 11.7–15.4)
WBC: 12 10*3/uL — ABNORMAL HIGH (ref 3.4–10.8)

## 2019-11-04 ENCOUNTER — Inpatient Hospital Stay (HOSPITAL_COMMUNITY)
Admission: AD | Admit: 2019-11-04 | Discharge: 2019-11-05 | Disposition: A | Discharge status: Home / Self Care | Payer: No Typology Code available for payment source | Attending: Family Medicine | Admitting: Family Medicine

## 2019-11-04 ENCOUNTER — Encounter (HOSPITAL_COMMUNITY): Payer: Self-pay | Admitting: Family Medicine

## 2019-11-04 ENCOUNTER — Other Ambulatory Visit: Payer: Self-pay

## 2019-11-04 DIAGNOSIS — Z3A24 24 weeks gestation of pregnancy: Secondary | ICD-10-CM | POA: Insufficient documentation

## 2019-11-04 DIAGNOSIS — O26892 Other specified pregnancy related conditions, second trimester: Secondary | ICD-10-CM | POA: Insufficient documentation

## 2019-11-04 DIAGNOSIS — O99512 Diseases of the respiratory system complicating pregnancy, second trimester: Secondary | ICD-10-CM | POA: Insufficient documentation

## 2019-11-04 DIAGNOSIS — K219 Gastro-esophageal reflux disease without esophagitis: Secondary | ICD-10-CM | POA: Insufficient documentation

## 2019-11-04 DIAGNOSIS — O99891 Other specified diseases and conditions complicating pregnancy: Secondary | ICD-10-CM | POA: Diagnosis not present

## 2019-11-04 DIAGNOSIS — O4702 False labor before 37 completed weeks of gestation, second trimester: Secondary | ICD-10-CM

## 2019-11-04 DIAGNOSIS — R000 Tachycardia, unspecified: Secondary | ICD-10-CM | POA: Diagnosis not present

## 2019-11-04 DIAGNOSIS — O30041 Twin pregnancy, dichorionic/diamniotic, first trimester: Secondary | ICD-10-CM

## 2019-11-04 DIAGNOSIS — R0981 Nasal congestion: Secondary | ICD-10-CM

## 2019-11-04 DIAGNOSIS — O30002 Twin pregnancy, unspecified number of placenta and unspecified number of amniotic sacs, second trimester: Secondary | ICD-10-CM | POA: Insufficient documentation

## 2019-11-04 DIAGNOSIS — Z7982 Long term (current) use of aspirin: Secondary | ICD-10-CM | POA: Insufficient documentation

## 2019-11-04 DIAGNOSIS — K900 Celiac disease: Secondary | ICD-10-CM | POA: Insufficient documentation

## 2019-11-04 DIAGNOSIS — Z79899 Other long term (current) drug therapy: Secondary | ICD-10-CM | POA: Insufficient documentation

## 2019-11-04 DIAGNOSIS — R102 Pelvic and perineal pain: Secondary | ICD-10-CM | POA: Diagnosis not present

## 2019-11-04 DIAGNOSIS — J069 Acute upper respiratory infection, unspecified: Secondary | ICD-10-CM | POA: Diagnosis not present

## 2019-11-04 DIAGNOSIS — O4700 False labor before 37 completed weeks of gestation, unspecified trimester: Secondary | ICD-10-CM

## 2019-11-04 DIAGNOSIS — O99612 Diseases of the digestive system complicating pregnancy, second trimester: Secondary | ICD-10-CM | POA: Insufficient documentation

## 2019-11-04 DIAGNOSIS — O0990 Supervision of high risk pregnancy, unspecified, unspecified trimester: Secondary | ICD-10-CM

## 2019-11-04 DIAGNOSIS — R0789 Other chest pain: Secondary | ICD-10-CM | POA: Diagnosis not present

## 2019-11-04 LAB — BASIC METABOLIC PANEL
Anion gap: 9 (ref 5–15)
BUN: <5 mg/dL — ABNORMAL LOW (ref 6–20)
CO2: 22 mmol/L (ref 22–32)
Calcium: 8.6 mg/dL — ABNORMAL LOW (ref 8.9–10.3)
Chloride: 105 mmol/L (ref 98–111)
Creatinine, Ser: 0.45 mg/dL (ref 0.44–1.00)
GFR calc Af Amer: >60 mL/min (ref >60)
GFR calc non Af Amer: >60 mL/min (ref >60)
Glucose, Bld: 111 mg/dL — ABNORMAL HIGH (ref 70–99)
Potassium: 3.3 mmol/L — ABNORMAL LOW (ref 3.5–5.1)
Sodium: 136 mmol/L (ref 135–145)

## 2019-11-04 LAB — URINALYSIS, ROUTINE W REFLEX MICROSCOPIC
Bilirubin Urine: NEGATIVE
Glucose, UA: NEGATIVE mg/dL
Hgb urine dipstick: NEGATIVE
Ketones, ur: 5 mg/dL — AB
Leukocytes,Ua: NEGATIVE
Nitrite: NEGATIVE
Protein, ur: NEGATIVE mg/dL
Specific Gravity, Urine: 1.003 — ABNORMAL LOW (ref 1.005–1.030)
pH: 7 (ref 5.0–8.0)

## 2019-11-04 LAB — WET PREP, GENITAL
Clue Cells Wet Prep HPF POC: NONE SEEN
Sperm: NONE SEEN
Trich, Wet Prep: NONE SEEN
Yeast Wet Prep HPF POC: NONE SEEN

## 2019-11-04 LAB — MAGNESIUM: Magnesium: 1.7 mg/dL (ref 1.7–2.4)

## 2019-11-04 MED ORDER — NIFEDIPINE 10 MG PO CAPS
10.0000 mg | ORAL_CAPSULE | ORAL | Status: AC
  Administered 2019-11-04 (×2): 10 mg via ORAL
  Filled 2019-11-04 (×2): qty 1

## 2019-11-04 MED ORDER — LACTATED RINGERS IV BOLUS
1000.0000 mL | Freq: Once | INTRAVENOUS | Status: AC
  Administered 2019-11-04: 1000 mL via INTRAVENOUS

## 2019-11-04 NOTE — MAU Note (Signed)
Pt complains of regular contractions every 20 minutes in her lower abdomen starting at 1500 today. Pt states she has had some cramping and pain lower back and abdomen. Also reports pressure in her vagina.
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
## 2019-11-05 ENCOUNTER — Inpatient Hospital Stay (HOSPITAL_COMMUNITY): Payer: No Typology Code available for payment source

## 2019-11-05 ENCOUNTER — Telehealth: Payer: Self-pay | Admitting: Women's Health

## 2019-11-05 DIAGNOSIS — R000 Tachycardia, unspecified: Secondary | ICD-10-CM

## 2019-11-05 LAB — GC/CHLAMYDIA PROBE AMP (~~LOC~~) NOT AT ARMC
Chlamydia: NEGATIVE
Comment: NEGATIVE
Comment: NORMAL
Neisseria Gonorrhea: NEGATIVE

## 2019-11-05 MED ORDER — GUAIFENESIN ER 600 MG PO TB12: 600.0000 mg | ORAL_TABLET | Freq: Two times a day (BID) | ORAL | 1 refills | Status: DC

## 2019-11-05 MED ORDER — LIDOCAINE VISCOUS HCL 2 % MT SOLN
15.0000 mL | Freq: Once | OROMUCOSAL | Status: AC
  Administered 2019-11-05: 15 mL via ORAL
  Filled 2019-11-05: qty 15

## 2019-11-05 MED ORDER — POTASSIUM CHLORIDE ER 10 MEQ PO TBCR: 10.0000 meq | EXTENDED_RELEASE_TABLET | Freq: Every day | ORAL | 0 refills | Status: DC

## 2019-11-05 MED ORDER — ALUM & MAG HYDROXIDE-SIMETH 200-200-20 MG/5ML PO SUSP
30.0000 mL | Freq: Once | ORAL | Status: AC
  Administered 2019-11-05: 30 mL via ORAL
  Filled 2019-11-05: qty 30

## 2019-11-05 NOTE — Telephone Encounter (Signed)
Patient went to the ER yesterday, for contractions, and found out she had covid today. Her symptoms started yesterday as well. She was told to follow up with her OB, would like a call back.

## 2019-11-05 NOTE — Discharge Instructions (Signed)
Viral Respiratory Infection A respiratory infection is an illness that affects part of the respiratory system, such as the lungs, nose, or throat. A respiratory infection that is caused by a virus is called a viral respiratory infection. Common types of viral respiratory infections include:  A cold.  The flu (influenza).  A respiratory syncytial virus (RSV) infection. What are the causes? This condition is caused by a virus. What are the signs or symptoms? Symptoms of this condition include:  A stuffy or runny nose.  Yellow or green nasal discharge.  A cough.  Sneezing.  Fatigue.  Achy muscles.  A sore throat.  Sweating or chills.  A fever.  A headache. How is this diagnosed? This condition may be diagnosed based on:  Your symptoms.  A physical exam.  Testing of nasal swabs. How is this treated? This condition may be treated with medicines, such as:  Antiviral medicine. This may shorten the length of time a person has symptoms.  Expectorants. These make it easier to cough up mucus.  Decongestant nasal sprays.  Acetaminophen or NSAIDs to relieve fever and pain. Antibiotic medicines are not prescribed for viral infections. This is because antibiotics are designed to kill bacteria. They are not effective against viruses. Follow these instructions at home:  Managing pain and congestion  Take over-the-counter and prescription medicines only as told by your health care provider.  If you have a sore throat, gargle with a salt-water mixture 3-4 times a day or as needed. To make a salt-water mixture, completely dissolve -1 tsp of salt in 1 cup of warm water.  Use nose drops made from salt water to ease congestion and soften raw skin around your nose.  Drink enough fluid to keep your urine pale yellow. This helps prevent dehydration and helps loosen up mucus. General instructions  Rest as much as possible.  Do not drink alcohol.  Do not use any products  that contain nicotine or tobacco, such as cigarettes and e-cigarettes. If you need help quitting, ask your health care provider.  Keep all follow-up visits as told by your health care provider. This is important. How is this prevented?   Get an annual flu shot. You may get the flu shot in late summer, fall, or winter. Ask your health care provider when you should get your flu shot.  Avoid exposing others to your respiratory infection. ? Stay home from work or school as told by your health care provider. ? Wash your hands with soap and water often, especially after you cough or sneeze. If soap and water are not available, use alcohol-based hand sanitizer.  Avoid contact with people who are sick during cold and flu season. This is generally fall and winter. Contact a health care provider if:  Your symptoms last for 10 days or longer.  Your symptoms get worse over time.  You have a fever.  You have severe sinus pain in your face or forehead.  The glands in your jaw or neck become very swollen. Get help right away if you:  Feel pain or pressure in your chest.  Have shortness of breath.  Faint or feel like you will faint.  Have severe and persistent vomiting.  Feel confused or disoriented. Summary  A respiratory infection is an illness that affects part of the respiratory system, such as the lungs, nose, or throat. A respiratory infection that is caused by a virus is called a viral respiratory infection.  Common types of viral respiratory infections are a  cold, influenza, and respiratory syncytial virus (RSV) infection.  Symptoms of this condition include a stuffy or runny nose, cough, sneezing, fatigue, achy muscles, sore throat, and fevers or chills.  Antibiotic medicines are not prescribed for viral infections. This is because antibiotics are designed to kill bacteria. They are not effective against viruses. This information is not intended to replace advice given to you by  your health care provider. Make sure you discuss any questions you have with your health care provider. Document Revised: 03/07/2018 Document Reviewed: 04/09/2017 Elsevier Patient Education  [place]. Sinus Tachycardia  Sinus tachycardia is a kind of fast heartbeat. In sinus tachycardia, the heart beats more than 100 times a minute. Sinus tachycardia starts in a part of the heart called the sinus node. Sinus tachycardia may be harmless, or it may be a sign of a serious condition. What are the causes? This condition may be caused by:  Exercise or exertion.  A fever.  Pain.  Loss of body fluids (dehydration).  Severe bleeding (hemorrhage).  Anxiety and stress.  Certain substances, including: ? Alcohol. ? Caffeine. ? Tobacco and nicotine products. ? Cold medicines. ? Illegal drugs.  Medical conditions including: ? Heart disease. ? An infection. ? An overactive thyroid (hyperthyroidism). ? A lack of red blood cells (anemia). What are the signs or symptoms? Symptoms of this condition include:  A feeling that the heart is beating quickly (palpitations).  Suddenly noticing your heartbeat (cardiac awareness).  Dizziness.  Tiredness (fatigue).  Shortness of breath.  Chest pain.  Nausea.  Fainting. How is this diagnosed? This condition is diagnosed with:  A physical exam.  Other tests, such as: ? Blood tests. ? An electrocardiogram (ECG). This test measures the electrical activity of the heart. ? Ambulatory cardiac monitor. This records your heartbeats for 24 hours or more. You may be referred to a heart specialist (cardiologist). How is this treated? Treatment for this condition depends on the cause or the underlying condition. Treatment may involve:  Treating the underlying condition.  Taking new medicines or changing your current medicines as told by your health care provider.  Making changes to your diet or lifestyle. Follow these instructions  at home: Lifestyle   Do not use any products that contain nicotine or tobacco, such as cigarettes and e-cigarettes. If you need help quitting, ask your health care provider.  Do not use illegal drugs, such as cocaine.  Learn relaxation methods to help you when you get stressed or anxious. These include deep breathing.  Avoid caffeine or other stimulants. Alcohol use   Do not drink alcohol if: ? Your health care provider tells you not to drink. ? You are pregnant, may be pregnant, or are planning to become pregnant.  If you drink alcohol, limit how much you have: ? 0-1 drink a day for women. ? 0-2 drinks a day for men.  Be aware of how much alcohol is in your drink. In the [place] one drink equals one typical bottle of beer (12 oz), one-half glass of wine (5 oz), or one shot of hard liquor (1 oz). General instructions  Drink enough fluids to keep your urine pale yellow.  Take over-the-counter and prescription medicines only as told by your health care provider.  Keep all follow-up visits as told by your health care provider. This is important. Contact a health care provider if you have:  A fever.  Vomiting or diarrhea that does not go away. Get help right away if you:  Have  pain in your chest, upper arms, jaw, or neck.  Become weak or dizzy.  Feel faint.  Have palpitations that do not go away. Summary  In sinus tachycardia, the heart beats more than 100 times a minute.  Sinus tachycardia may be harmless, or it may be a sign of a serious condition.  Treatment for this condition depends on the cause or the underlying condition.  Get help right away if you have pain in your chest, upper arms, jaw, or neck. This information is not intended to replace advice given to you by your health care provider. Make sure you discuss any questions you have with your health care provider. Document Revised: 04/18/2017 Document Reviewed: 04/18/2017 Elsevier Patient Education  [place]. COVID-19 Vaccination if [employer] Are Pregnant or Breastfeeding  The Society for Maternal-Fetal Medicine (SMFM) and other pregnancy experts recommend that pregnant and lactating people be vaccinated against COVID-19. The Centers for Disease Control and Prevention (CDC) also recommend vaccination for "all people aged 34 years and older, including people who are pregnant, breastfeeding, trying to get pregnant now, or might become pregnant in the future." Vaccination is the best way to reduce the risks of COVID-19 infection and COVID-related complications for both you and your baby.  Three vaccines are available to prevent COVID-19: . The two-dose Pfizer vaccine for people 12 years and older--APPROVED by the [place] Food and Drug Administration on November 03, 2019 . The two-dose Moderna vaccine for people 18 years and older--AUTHORIZED for emergency use . The one-dose The Sherwin- vaccine for people 18 years and older (you may also see this vaccine referred to as the "Janssen vaccine")--AUTHORIZED for emergency use  For those receiving the [facility], the second dose is given 21 days [employer]) and 28 days (Moderna) after the first dose. The The Sherwin- vaccine is only one dose.  Information for Pregnant Individuals If you are pregnant or planning to become pregnant and are thinking about getting vaccinated, consider talking with your health care professional about the vaccine.   To help with your decision, you should consider the following key points: Anyone can get the COVID vaccines free of charge regardless of immigration status or whether they have insurance. You may be asked for your social security number, but it is  NOT required to get vaccinated.  What are benefits of getting the COVID-19 vaccines during pregnancy?  . The vaccines can help protect you from getting COVID-19. With the two-dose vaccines, you must get both doses for maximum effectiveness. It's not  yet known how long protection lasts.  . Another potential benefit is that getting the vaccine while pregnant may help you pass antiCOVID-19 antibodies to your baby. In numerous studies of vaccinated moms, antibodies were found in the umbilical cord blood of babies and in the mother's breastmilk.  . The CDC, along with other federal partners, are monitoring people who have been vaccinated for serious side effects. So far, more than 139,000 pregnant people have been vaccinated. No unexpected pregnancy or fetal problems have occurred. There have been no reports of any increased risk of pregnancy loss, growth problems, or birth defects.  . A safe vaccine is generally considered one in which the benefits of being vaccinated outweigh the risks. The current vaccines are not live vaccines. There is only a very small  chance that they cross the placenta, so it's unlikely that they even reach the fetus. Vaccines don't affect future fertility. The only people who should NOT  get vaccinated are those who have had a severe allergic reaction to vaccines in the past or any vaccine ingredients.  . Side effects may occur in the first 3 days after getting vaccinated. These include mild to moderate fever, headache, and muscle aches. Side effects may be worse after the second dose of the [employer] and Moderna vaccines. Fever should be avoided during pregnancy,especially in the first trimester. Those who develop a fever after vaccination can take acetaminophen (Tylenol). This medication is safe to use during pregnancy and does not  affect how the vaccine works.   What are the known risks of getting COVID-19 during pregnancy?  About 1 to 3 per 1,000 pregnant women with COVID-19 will develop severe disease. Compared with those who aren't pregnant, pregnant people infected by the COVID-19 virus: . Are 3 times more likely to need ICU care . Are 2 to 3 times more likely to need advanced life support and a breathing tube  . Have  a small increased risk of dying due to COVID-19 They may also be at increased risk of stillbirth and preterm birth.  What is my risk of getting COVID-19?  Your risk of getting COVID-19 depends on the chance that you will come into contact with another infected person. The risk may be higher if you live in a community where there is a lot of COVID-19 infection or work in healthcare or another high-contact setting.   What is my risk for severe complications if I get [id number]?  Data show that older pregnant women; those with preexisting health conditions, such as a body mass index higher than 35 kg/m2, diabetes, and heart disorders; and Black or Latinx women have an especially increased risk of severe disease and death from COVID-19.   If you still have questions about the vaccines or need more information, ask your health care provider or go to the Centers for Disease Control and Prevention's COVID-19 vaccine webpage.   An Update on the The Sherwin- Vaccine   In April 2021, the FDA and CDC called for a brief pause to use of the The Sherwin- vaccine. They did so after reports of a severe side effect in a very small number of women younger than age 50 following vaccination. This side effect, called thrombosis with thrombocytopenia syndrome (TTS), causes blood clots (thrombosis) combined with low levels of platelets (thrombocytopenia).  TTS following the The Sherwin- vaccine is extremely rare. At the time of this update, it has occurred in only 7 people per 1 million The Sherwin- shots given. According to the CDC, being on hormonal birth control (the pill, patch, or ring), pregnancy, breastfeeding, or being recently pregnant does not make you more likely to develop TTS after getting the The Sherwin- vaccine. The pause was lifted on July 04, 2019, after the FDA and CDC determined that the known benefits of the The Sherwin- vaccine far outweigh the risks.   Health care  professionals have been alerted to the possibility of this side effect in people who have received the [facility] vaccine. National organizations continue to recommend COVID-19 vaccination with any of the vaccines for pregnant women. All women younger than age 44 years, whether pregnant, breastfeeding, or not, should be aware of the very rare risk of TTS after getting the The Sherwin- vaccine. The Pfizer and Moderna vaccines don't have this risk. If you get the The Sherwin- vaccine,  seek medical help right away if you develop any  of the following symptoms within 3 weeks of getting your shot:  . Severe or persistent headaches or blurred vision . Shortness of breath . Chest pain . Leg swelling . Persistent abdominal pain . Easy bruising or tiny blood spots under the skin beyond the injection site  Experts continue to collect health and safety information from pregnant people who have been vaccinated. If you have questions about vaccination during pregnancy, visit the CDC website or talk to your health care professional.   Information for Breastfeeding/Lactating Individuals  The Society for Maternal-Fetal Medicine and other pregnancy experts recommend COVID-19 vaccination for people who are breastfeeding/lactating. You don't have to delay or stop breastfeeding just because you get vaccinated.   Getting Vaccinated  You can get vaccinated at any time during pregnancy. The CDC is committed to monitoring the vaccine's safety for all individuals. Your health professional or vaccine clinic may give you information about enrolling in the v-safe after vaccination health checker (see the box below).Even after you're fully vaccinated, it is important to follow the CDC's guidance for wearing a mask indoors in areas where there are substantial or high rates of COVID-19 infection.   What Happens When You Enroll in v-Safe?  The v-safe after vaccination health checker program lets the CDC check in  with you after your vaccination. At sign-up, you can indicate that you are pregnant. Once you do that, expect the following: . Someone may call you from the v-safe program to ask initial questions and get more information. . You may be asked to enroll in the vaccine pregnancy registry, which is collecting information about any effects of the vaccine during pregnancy. This is a great way to help scientists monitor the vaccine's safety and effectiveness.   References 1. [address], Armandina Gemma M, Wallace M, Curran KG, Chamberland M, et al. The Advisory Committee on [employer]' Interim Recommendation for Use of Pfizer-BioNTech  COVID-19 Vaccine -- [place], December 2020. MMWR Morbidity and Mortality Weekly Report 2020;69. 2. FDA Briefing Document. Janssen Ad26.COV2.S Vaccine for the Prevention of COVID-19. 2021. Accessed May 16, 2019; Available from: achegone.com 3. PFIZER-BIONTECH COVID-19 VACCINE [package insert] [place]: Pfizer and [place], [place]: Biontech;2020. 4. FDA Briefing Document. Moderna COVID-19 Vaccine. 2020. Accessed 2020, Dec 18; Available from: NameImpressions.gl  5. Lottie Dawson, Bordt EA, Atyeo C, Deriso E, Akinwunmi B, Young N, et al. COVID-19 vaccine response in pregnant and lactating women: a cohort study. Am J Obstet Gynecol 2021 Mar 24. 6. Panagiotakopoulos Carlean Jews, Alroy Bailiff, Lipkind HS, Jerold Coombe DS, et al. SARS-CoV-2 Infection Among Hospitalized Pregnant Women: Reasons for Admission and Pregnancy  Characteristics - [place], March 1-Aug 10, 2018. MMWR Morb Neysa Bonito Rep 2020 Sep 23;69(38):1355-9. 7. Zambrano LD, Ransom, Strid P, [place], Ralene Cork VT, et al. Update: Characteristics of Symptomatic Women of Reproductive Age with Laboratory-Confirmed SARSCoV-2 Infection by Pregnancy Status - [place], January 22-December 14, 2018. MMWR Morb Neysa Bonito Rep 2020 Nov  6;69(44):1641-7. 8. Delahoy MJ, Madaline Savage, Dorna Leitz PD, Jacinta Shoe, et al. Characteristics and Maternal and Birth Outcomes of Hospitalized Pregnant Women with Laboratory-Confirmed COVID-19 - [place], 3 States, March 1-November 02, 2018. MMWR Morb Neysa Bonito Rep 2020 Sep 25;69(38):1347-54.

## 2019-11-05 NOTE — Telephone Encounter (Signed)
Pt was diagnosed with covid today. Having runny nose, low grade temp, body aches, headache. Pt is taking Claritin. I advised can take Tylenol and Mucinex. Run cool mist humidifier in room. If anything changes, let us know. Pt voiced understanding. [place]

## 2019-11-10 ENCOUNTER — Other Ambulatory Visit: Payer: Self-pay | Admitting: Women's Health

## 2019-11-10 DIAGNOSIS — O0992 Supervision of high risk pregnancy, unspecified, second trimester: Secondary | ICD-10-CM

## 2019-11-10 DIAGNOSIS — Z3A25 25 weeks gestation of pregnancy: Secondary | ICD-10-CM

## 2019-11-10 DIAGNOSIS — L299 Pruritus, unspecified: Secondary | ICD-10-CM

## 2019-11-16 ENCOUNTER — Encounter: Payer: Self-pay | Admitting: Emergency Medicine

## 2019-11-16 ENCOUNTER — Other Ambulatory Visit: Payer: Self-pay

## 2019-11-16 ENCOUNTER — Ambulatory Visit
Admission: EM | Admit: 2019-11-16 | Discharge: 2019-11-16 | Disposition: A | Discharge status: Home / Self Care | Payer: No Typology Code available for payment source | Attending: Emergency Medicine | Admitting: Emergency Medicine

## 2019-11-16 DIAGNOSIS — L299 Pruritus, unspecified: Secondary | ICD-10-CM | POA: Diagnosis not present

## 2019-11-16 DIAGNOSIS — Z3A26 26 weeks gestation of pregnancy: Secondary | ICD-10-CM

## 2019-11-16 LAB — COMPREHENSIVE METABOLIC PANEL
ALT: 42 IU/L — ABNORMAL HIGH (ref 0–32)
AST: 61 IU/L — ABNORMAL HIGH (ref 0–40)
Albumin/Globulin Ratio: 1.4 (ref 1.2–2.2)
Albumin: 3.4 g/dL — ABNORMAL LOW (ref 3.9–5.0)
Alkaline Phosphatase: 307 IU/L — ABNORMAL HIGH (ref 48–121)
BUN/Creatinine Ratio: 11 (ref 9–23)
BUN: 6 mg/dL (ref 6–20)
Bilirubin Total: 1.8 mg/dL — ABNORMAL HIGH (ref 0.0–1.2)
CO2: 20 mmol/L (ref 20–29)
Calcium: 8.6 mg/dL — ABNORMAL LOW (ref 8.7–10.2)
Chloride: 103 mmol/L (ref 96–106)
Creatinine, Ser: 0.53 mg/dL — ABNORMAL LOW (ref 0.57–1.00)
GFR calc Af Amer: 153 mL/min/{1.73_m2} (ref >59)
GFR calc non Af Amer: 132 mL/min/{1.73_m2} (ref >59)
Globulin, Total: 2.5 g/dL (ref 1.5–4.5)
Glucose: 81 mg/dL (ref 65–99)
Potassium: 3.6 mmol/L (ref 3.5–5.2)
Sodium: 137 mmol/L (ref 134–144)
Total Protein: 5.9 g/dL — ABNORMAL LOW (ref 6.0–8.5)

## 2019-11-16 LAB — BILE ACIDS, TOTAL: Bile Acids Total: 79.5 umol/L — ABNORMAL HIGH (ref 0.0–10.0)

## 2019-11-16 MED ORDER — TRIAMCINOLONE ACETONIDE 0.1 % EX CREA: 1.0000 "application " | TOPICAL_CREAM | Freq: Two times a day (BID) | CUTANEOUS | 0 refills | Status: DC

## 2019-11-16 NOTE — Discharge Instructions (Addendum)
Use zyrtec, claritin, or allegra during the wake time hours Try taking benadryl or unisom during sleep hours Triamcinolone prescribed.  Use as directed.  Do not use longer than 1-2 weeks Follow up with OB this week for further evaluation and management Return or go to the ER if you have any new or worsening symptoms such as fever, chills, nausea, vomiting, redness, swelling, discharge, if symptoms do not improve with medications, etc..Marland Kitchen
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
## 2019-11-18 ENCOUNTER — Telehealth: Payer: Self-pay | Admitting: Women's Health

## 2019-11-18 ENCOUNTER — Other Ambulatory Visit: Payer: Self-pay | Admitting: Women's Health

## 2019-11-18 ENCOUNTER — Ambulatory Visit (INDEPENDENT_AMBULATORY_CARE_PROVIDER_SITE_OTHER): Payer: No Typology Code available for payment source | Admitting: *Deleted

## 2019-11-18 ENCOUNTER — Other Ambulatory Visit: Payer: Self-pay

## 2019-11-18 DIAGNOSIS — O30042 Twin pregnancy, dichorionic/diamniotic, second trimester: Secondary | ICD-10-CM

## 2019-11-18 DIAGNOSIS — O30041 Twin pregnancy, dichorionic/diamniotic, first trimester: Secondary | ICD-10-CM

## 2019-11-18 DIAGNOSIS — K831 Obstruction of bile duct: Secondary | ICD-10-CM

## 2019-11-18 DIAGNOSIS — O0992 Supervision of high risk pregnancy, unspecified, second trimester: Secondary | ICD-10-CM

## 2019-11-18 DIAGNOSIS — O0990 Supervision of high risk pregnancy, unspecified, unspecified trimester: Secondary | ICD-10-CM

## 2019-11-18 DIAGNOSIS — Z8719 Personal history of other diseases of the digestive system: Secondary | ICD-10-CM | POA: Insufficient documentation

## 2019-11-18 DIAGNOSIS — Z8759 Personal history of other complications of pregnancy, childbirth and the puerperium: Secondary | ICD-10-CM | POA: Insufficient documentation

## 2019-11-18 MED ORDER — URSODIOL 300 MG PO CAPS: 300.0000 mg | ORAL_CAPSULE | Freq: Three times a day (TID) | ORAL | 1 refills | Status: DC

## 2019-11-18 NOTE — Progress Notes (Signed)
Called pt, discussed lab results, dx cholestasis, rx actigall 338m TID. Baby B not moving as much, will come in for monitoring.  Plan: [id number]     Weekly BPP @ 28wks then 2x/wk testing @ 32wks or weekly BPP     Deliver 37wks or as per MFM:____

## 2019-11-18 NOTE — Telephone Encounter (Signed)
Pt is 26 weeks and having itching hands and feet  And on body please call and advise

## 2019-11-18 NOTE — Progress Notes (Addendum)
   NURSE VISIT- NST  SUBJECTIVE:  Maria Cooper is a 24 y.o. G2P1001 female at [redacted]w[redacted]d here for a NST for pregnancy complicated by Decreased fetal movement .  She reports decreased  fetal movement of baby B, contractions: none, vaginal bleeding: none, membranes: intact.   OBJECTIVE:  BP 107/63   LMP 05/15/2019   Appears well, no apparent distress  No results found for this or any previous visit (from the past 24 hour(s)).  NST: FHR Baby A baseline 140 bpm, Variability: moderate, Accelerations:absent, Decelerations:  Absent= Cat 1/Reactive FHR Baby B baseline 140, Variabliity moderate,Accelerations:absent, decels:absent=Cat 1/Reative Toco: none   ASSESSMENT: G2P1001 at 240w5dith Decreased fetal movement NST reactive  PLAN: EFM strip [facility] KiKnute [facility]M, [facility] Recommendations: keep next appointment as scheduled    LaAlice Rieger9/09/2019 4:23 PM   Chart reviewed for nurse visit. Agree with plan of care.  BoRoma SchanzCNNorth [place]/09/2019 5:07 PM

## 2019-11-21 ENCOUNTER — Other Ambulatory Visit: Payer: Self-pay

## 2019-11-21 ENCOUNTER — Encounter (HOSPITAL_COMMUNITY): Payer: Self-pay | Admitting: Obstetrics & Gynecology

## 2019-11-21 ENCOUNTER — Telehealth: Payer: Self-pay

## 2019-11-21 ENCOUNTER — Inpatient Hospital Stay (HOSPITAL_COMMUNITY)
Admission: AD | Admit: 2019-11-21 | Discharge: 2019-11-21 | Disposition: A | Discharge status: Home / Self Care | Payer: No Typology Code available for payment source | Attending: Obstetrics & Gynecology | Admitting: Obstetrics & Gynecology

## 2019-11-21 DIAGNOSIS — O30042 Twin pregnancy, dichorionic/diamniotic, second trimester: Secondary | ICD-10-CM | POA: Insufficient documentation

## 2019-11-21 DIAGNOSIS — O99612 Diseases of the digestive system complicating pregnancy, second trimester: Secondary | ICD-10-CM | POA: Diagnosis not present

## 2019-11-21 DIAGNOSIS — Z3A27 27 weeks gestation of pregnancy: Secondary | ICD-10-CM | POA: Insufficient documentation

## 2019-11-21 DIAGNOSIS — O4702 False labor before 37 completed weeks of gestation, second trimester: Secondary | ICD-10-CM

## 2019-11-21 DIAGNOSIS — O26612 Liver and biliary tract disorders in pregnancy, second trimester: Secondary | ICD-10-CM | POA: Diagnosis not present

## 2019-11-21 DIAGNOSIS — Z7982 Long term (current) use of aspirin: Secondary | ICD-10-CM | POA: Diagnosis not present

## 2019-11-21 DIAGNOSIS — K900 Celiac disease: Secondary | ICD-10-CM | POA: Diagnosis not present

## 2019-11-21 DIAGNOSIS — K831 Obstruction of bile duct: Secondary | ICD-10-CM | POA: Insufficient documentation

## 2019-11-21 DIAGNOSIS — K219 Gastro-esophageal reflux disease without esophagitis: Secondary | ICD-10-CM | POA: Insufficient documentation

## 2019-11-21 DIAGNOSIS — Z79899 Other long term (current) drug therapy: Secondary | ICD-10-CM | POA: Insufficient documentation

## 2019-11-21 HISTORY — DX: Obstruction of bile duct: K83.1

## 2019-11-21 MED ORDER — HYDROXYZINE HCL 25 MG PO TABS
25.0000 mg | ORAL_TABLET | Freq: Once | ORAL | Status: DC
  Filled 2019-11-21 (×2): qty 1

## 2019-11-21 MED ORDER — NIFEDIPINE 10 MG PO CAPS: 10.0000 mg | ORAL_CAPSULE | ORAL | 3 refills | Status: DC | PRN

## 2019-11-21 MED ORDER — NIFEDIPINE 10 MG PO CAPS
10.0000 mg | ORAL_CAPSULE | Freq: Once | ORAL | Status: AC
  Administered 2019-11-21: 10 mg via ORAL
  Filled 2019-11-21: qty 1

## 2019-11-21 MED ORDER — HYDROXYZINE HCL 25 MG PO TABS: 25.0000 mg | ORAL_TABLET | Freq: Four times a day (QID) | ORAL | 3 refills | Status: DC | PRN

## 2019-11-21 NOTE — Telephone Encounter (Signed)
Period cramping, light contractions consisently  pt 27 weeks with twins sharp pain in pelvic area. 24 hours increasingly getting worse. Please advise pt.

## 2019-11-21 NOTE — MAU Note (Signed)
Pt started having some CTX since around 8pm last night.  Started every 20 min then down to every 10 and today every 43mn.  Pt also having some diarrhea for past few days (1 in past 24hrs).  Pt also c/o itching r/t cholestasis.  Reports good fetal movement.  Denies vaginal bleeding or LOF.
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
## 2019-11-21 NOTE — Telephone Encounter (Signed)
Pt having period cramps and contractions that's getting worse over the last 24 hours. I spoke with Tish, RN. Pt was advised to go to MAU for evaluation. Pt voiced understanding. [place]

## 2019-11-25 ENCOUNTER — Other Ambulatory Visit: Payer: No Typology Code available for payment source

## 2019-11-27 ENCOUNTER — Other Ambulatory Visit: Payer: Self-pay | Admitting: Women's Health

## 2019-11-27 DIAGNOSIS — K831 Obstruction of bile duct: Secondary | ICD-10-CM

## 2019-11-27 DIAGNOSIS — O30042 Twin pregnancy, dichorionic/diamniotic, second trimester: Secondary | ICD-10-CM

## 2019-11-27 DIAGNOSIS — O26612 Liver and biliary tract disorders in pregnancy, second trimester: Secondary | ICD-10-CM

## 2019-11-27 DIAGNOSIS — O0992 Supervision of high risk pregnancy, unspecified, second trimester: Secondary | ICD-10-CM

## 2019-11-28 ENCOUNTER — Other Ambulatory Visit: Payer: Self-pay | Admitting: *Deleted

## 2019-11-28 ENCOUNTER — Ambulatory Visit (INDEPENDENT_AMBULATORY_CARE_PROVIDER_SITE_OTHER): Payer: No Typology Code available for payment source | Admitting: Women's Health

## 2019-11-28 ENCOUNTER — Other Ambulatory Visit: Payer: Self-pay

## 2019-11-28 ENCOUNTER — Encounter: Payer: Self-pay | Admitting: Women's Health

## 2019-11-28 ENCOUNTER — Other Ambulatory Visit: Payer: No Typology Code available for payment source

## 2019-11-28 ENCOUNTER — Ambulatory Visit (INDEPENDENT_AMBULATORY_CARE_PROVIDER_SITE_OTHER): Payer: No Typology Code available for payment source

## 2019-11-28 VITALS — BP 121/65 | HR 70 | Wt 179.6 lb

## 2019-11-28 DIAGNOSIS — Z3A28 28 weeks gestation of pregnancy: Secondary | ICD-10-CM

## 2019-11-28 DIAGNOSIS — O30042 Twin pregnancy, dichorionic/diamniotic, second trimester: Secondary | ICD-10-CM

## 2019-11-28 DIAGNOSIS — Z1389 Encounter for screening for other disorder: Secondary | ICD-10-CM | POA: Diagnosis not present

## 2019-11-28 DIAGNOSIS — O0990 Supervision of high risk pregnancy, unspecified, unspecified trimester: Secondary | ICD-10-CM

## 2019-11-28 DIAGNOSIS — K831 Obstruction of bile duct: Secondary | ICD-10-CM | POA: Diagnosis not present

## 2019-11-28 DIAGNOSIS — O0992 Supervision of high risk pregnancy, unspecified, second trimester: Secondary | ICD-10-CM

## 2019-11-28 DIAGNOSIS — O30041 Twin pregnancy, dichorionic/diamniotic, first trimester: Secondary | ICD-10-CM

## 2019-11-28 DIAGNOSIS — O26612 Liver and biliary tract disorders in pregnancy, second trimester: Secondary | ICD-10-CM

## 2019-11-28 DIAGNOSIS — O30043 Twin pregnancy, dichorionic/diamniotic, third trimester: Secondary | ICD-10-CM

## 2019-11-28 DIAGNOSIS — O26613 Liver and biliary tract disorders in pregnancy, third trimester: Secondary | ICD-10-CM

## 2019-11-28 DIAGNOSIS — Z331 Pregnant state, incidental: Secondary | ICD-10-CM

## 2019-11-28 DIAGNOSIS — O26619 Liver and biliary tract disorders in pregnancy, unspecified trimester: Secondary | ICD-10-CM

## 2019-11-28 DIAGNOSIS — O0993 Supervision of high risk pregnancy, unspecified, third trimester: Secondary | ICD-10-CM

## 2019-11-28 DIAGNOSIS — Z131 Encounter for screening for diabetes mellitus: Secondary | ICD-10-CM

## 2019-11-28 LAB — POCT URINALYSIS DIPSTICK OB
Glucose, UA: NEGATIVE
Ketones, UA: NEGATIVE
Leukocytes, UA: NEGATIVE
Nitrite, UA: NEGATIVE

## 2019-11-28 NOTE — Progress Notes (Signed)
Clear, watery discharge yesterday am.

## 2019-11-28 NOTE — Patient Instructions (Signed)
Maria Cooper, I greatly value your feedback.  If you receive a survey following your visit with [place] today, we appreciate you taking the time to fill it out.  Thanks, Knute Neu, CNM, WHNP-BC   [facility] [place] at [facility] ([place], [place] [address]) Entrance C, located off of [place] parking  Go to [email or web address] to register for FREE online childbirth classes   Call the office [phone]) or go to [facility] if:  You begin to have strong, frequent contractions  Your water breaks.  Sometimes it is a big gush of fluid, sometimes it is just a trickle that keeps getting your panties wet or running down your legs  You have vaginal bleeding.  It is normal to have a small amount of spotting if your cervix was checked.   You don't feel your baby moving like normal.  If you don't, get you something to eat and drink and lay down and focus on feeling your baby move.  You should feel at least 10 movements in 2 hours.  If you don't, you should call the office or go to [facility].    Tdap Vaccine  It is recommended that you get the Tdap vaccine during the third trimester of EACH pregnancy to help protect your baby from getting pertussis (whooping cough)  27-36 weeks is the BEST time to do this so that you can pass the protection on to your baby. During pregnancy is better than after pregnancy, but if you are unable to get it during pregnancy it will be offered at the hospital.   You can get this vaccine with [place], at the health department, your family doctor, or some local pharmacies  Everyone who will be around your baby should also be up-to-date on their vaccines before the baby comes. Adults (who are not pregnant) only need 1 dose of Tdap during adulthood.   [place] Pediatricians/Family Doctors:  [facility] [phone]                 [place]  [phone] (usually not accepting new patients unless you have family there already, you are always welcome to call and ask)       [facility] Department [phone]       [facility] Pediatricians/Family Doctors:   [facility]: [phone]  [facility]: [phone]  [facility]: [place] Doctors:   [facility]: [facility]: [place]:  [place]: [phone]   Home Blood Pressure Monitoring for Patients   Your provider has recommended that you check your blood pressure (BP) at least once a week at home. If you do not have a blood pressure cuff at home, one will be provided for you. Contact your provider if you have not received your monitor within 1 week.   Helpful Tips for Accurate Home Blood Pressure Checks  . Don't smoke, exercise, or drink caffeine 30 minutes before checking your BP . Use the restroom before checking your BP (a full bladder can raise your pressure) . Relax in a comfortable upright chair . Feet on the ground . Left arm resting comfortably on a flat surface at the level of your heart . Legs uncrossed . Back supported . Sit quietly and don't talk . Place the cuff on your  bare arm . Adjust snuggly, so that only two fingertips can fit between your skin and the top of the cuff . Check 2 readings separated by at least one minute . Keep a log of your BP readings . For a visual, please reference this diagram: http://ccnc.care/bpdiagram  Provider Name: [facility]     Phone: [phone]  Zone 1: ALL CLEAR  Continue to monitor your symptoms:  . BP reading is less than 140 (top number) or less than 90 (bottom number)  . No right upper stomach pain . No headaches or seeing spots . No feeling nauseated or throwing up . No swelling in face and hands  Zone 2: CAUTION Call your  doctor's office for any of the following:  . BP reading is greater than 140 (top number) or greater than 90 (bottom number)  . Stomach pain under your ribs in the middle or right side . Headaches or seeing spots . Feeling nauseated or throwing up . Swelling in face and hands  Zone 3: EMERGENCY  Seek immediate medical care if you have any of the following:  . BP reading is greater than160 (top number) or greater than 110 (bottom number) . Severe headaches not improving with Tylenol . Serious difficulty catching your breath . Any worsening symptoms from Zone 2   Third Trimester of Pregnancy The third trimester is from week 29 through week 42, months 7 through 9. The third trimester is a time when the fetus is growing rapidly. At the end of the ninth month, the fetus is about 20 inches in length and weighs 6-10 pounds.  BODY CHANGES Your body goes through many changes during pregnancy. The changes vary from woman to woman.   Your weight will continue to increase. You can expect to gain 25-35 pounds (11-16 kg) by the end of the pregnancy.  You may begin to get stretch marks on your hips, abdomen, and breasts.  You may urinate more often because the fetus is moving lower into your pelvis and pressing on your bladder.  You may develop or continue to have heartburn as a result of your pregnancy.  You may develop constipation because certain hormones are causing the muscles that push waste through your intestines to slow down.  You may develop hemorrhoids or swollen, bulging veins (varicose veins).  You may have pelvic pain because of the weight gain and pregnancy hormones relaxing your joints between the bones in your pelvis. Backaches may result from overexertion of the muscles supporting your posture.  You may have changes in your hair. These can include thickening of your hair, rapid growth, and changes in texture. Some women also have hair loss during or after pregnancy, or hair that  feels dry or thin. Your hair will most likely return to normal after your baby is born.  Your breasts will continue to grow and be tender. A yellow discharge may leak from your breasts called colostrum.  Your belly button may stick out.  You may feel short of breath because of your expanding uterus.  You may notice the fetus "dropping," or moving lower in your abdomen.  You may have a bloody mucus discharge. This usually occurs a few days to a week before labor begins.  Your cervix becomes thin and soft (effaced) near your due date. WHAT TO EXPECT AT YOUR PRENATAL EXAMS  You will have prenatal exams every 2 weeks until week 36. Then, you will have weekly prenatal exams. During a routine prenatal visit:  You will be weighed to make sure you and the fetus are growing normally.  Your blood pressure is taken.  Your abdomen will be measured to track your baby's growth.  The fetal heartbeat will be listened to.  Any test results from the previous visit will be discussed.  You may have a cervical check near your due date to see if you have effaced. At around 36 weeks, your caregiver will check your cervix. At the same time, your caregiver will also perform a test on the secretions of the vaginal tissue. This test is to determine if a type of bacteria, Group B streptococcus, is present. Your caregiver will explain this further. Your caregiver may ask you:  What your birth plan is.  How you are feeling.  If you are feeling the baby move.  If you have had any abnormal symptoms, such as leaking fluid, bleeding, severe headaches, or abdominal cramping.  If you have any questions. Other tests or screenings that may be performed during your third trimester include:  Blood tests that check for low iron levels (anemia).  Fetal testing to check the health, activity level, and growth of the fetus. Testing is done if you have certain medical conditions or if there are problems during the  pregnancy. FALSE LABOR You may feel small, irregular contractions that eventually go away. These are called Braxton Hicks contractions, or false labor. Contractions may last for hours, days, or even weeks before true labor sets in. If contractions come at regular intervals, intensify, or become painful, it is best to be seen by your caregiver.  SIGNS OF LABOR   Menstrual-like cramps.  Contractions that are 5 minutes apart or less.  Contractions that start on the top of the uterus and spread down to the lower abdomen and back.  A sense of increased pelvic pressure or back pain.  A watery or bloody mucus discharge that comes from the vagina. If you have any of these signs before the 37th week of pregnancy, call your caregiver right away. You need to go to the hospital to get checked immediately. HOME CARE INSTRUCTIONS   Avoid all smoking, herbs, alcohol, and unprescribed drugs. These chemicals affect the formation and growth of the baby.  Follow your caregiver's instructions regarding medicine use. There are medicines that are either safe or unsafe to take during pregnancy.  Exercise only as directed by your caregiver. Experiencing uterine cramps is a good sign to stop exercising.  Continue to eat regular, healthy meals.  Wear a good support bra for breast tenderness.  Do not use hot tubs, steam rooms, or saunas.  Wear your seat belt at all times when driving.  Avoid raw meat, uncooked cheese, cat litter boxes, and soil used by cats. These carry germs that can cause birth defects in the baby.  Take your prenatal vitamins.  Try taking a stool softener (if your caregiver approves) if you develop constipation. Eat more high-fiber foods, such as fresh vegetables or fruit and whole grains. Drink plenty of fluids to keep your urine clear or pale yellow.  Take warm sitz baths to soothe any pain or discomfort caused by hemorrhoids. Use hemorrhoid cream if your caregiver approves.  If you  develop varicose veins, wear support hose. Elevate your feet for 15 minutes, 3-4 times a day. Limit salt in your diet.  Avoid heavy lifting, wear low heal shoes, and practice good posture.  Rest a lot with your legs elevated if you have leg cramps or low  back pain.  Visit your dentist if you have not gone during your pregnancy. Use a soft toothbrush to brush your teeth and be gentle when you floss.  A sexual relationship may be continued unless your caregiver directs you otherwise.  Do not travel far distances unless it is absolutely necessary and only with the approval of your caregiver.  Take prenatal classes to understand, practice, and ask questions about the labor and delivery.  Make a trial run to the hospital.  Pack your hospital bag.  Prepare the baby's nursery.  Continue to go to all your prenatal visits as directed by your caregiver. SEEK MEDICAL CARE IF:  You are unsure if you are in labor or if your water has broken.  You have dizziness.  You have mild pelvic cramps, pelvic pressure, or nagging pain in your abdominal area.  You have persistent nausea, vomiting, or diarrhea.  You have a bad smelling vaginal discharge.  You have pain with urination. SEEK IMMEDIATE MEDICAL CARE IF:   You have a fever.  You are leaking fluid from your vagina.  You have spotting or bleeding from your vagina.  You have severe abdominal cramping or pain.  You have rapid weight loss or gain.  You have shortness of breath with chest pain.  You notice sudden or extreme swelling of your face, hands, ankles, feet, or legs.  You have not felt your baby move in over an hour.  You have severe headaches that do not go away with medicine.  You have vision changes. Document Released: 02/21/2001 Document Revised: 03/04/2013 Document Reviewed: 04/30/2012 [facility] Patient Information 2015 [place], [place]. This information is not intended to replace advice given to you by your health  care provider. Make sure you discuss any questions you have with your health care provider.

## 2019-11-28 NOTE — Progress Notes (Signed)
HIGH-RISK PREGNANCY VISIT Patient name: Maria Cooper MRN [id number]  Date of birth: 11/01/1995 Chief Complaint:   High Risk Gestation (PN2 & [place] today)  History of Present Illness:   Maria Cooper is a 24 y.o. G47P1001 female at 19w1dwith an Estimated Date of Delivery: 02/19/20 being seen today for ongoing management of a high-risk pregnancy complicated by intrahepatic cholestasis of pregnancy currently on actigall 3097mTID and multiple gestation di-di twins.  Today she reports no more itching since starting actigall- took about a week to go away. Went to [facility] w/ contractions, given rx for procardia, hasn't had to take yet. Had leaking of fluid x 1 yesterday, hasn't seen anything since.  [facility] 11/28/2019 08/07/2019 10/05/2017 07/02/2017 03/30/2017  Decreased Interest 0 0 0 0 0  Down, Depressed, Hopeless 0 0 0 0 0  PHQ - 2 Score 0 0 0 0 0  Altered sleeping 0 0 - 0 -  Tired, decreased energy 0 0 - 0 -  Change in appetite 0 0 - 0 -  Feeling bad or failure about yourself  0 0 - 0 -  Trouble concentrating 0 0 - 0 -  Moving slowly or fidgety/restless 0 0 - 0 -  Suicidal thoughts 0 0 - 0 -  PHQ-9 Score 0 0 - 0 -  Difficult doing work/chores - Not difficult at all - - -    Contractions: Irregular. Vag. Bleeding: None.  Movement: Present. reports leaking of fluid.  Review of Systems:   Pertinent items are noted in HPI Denies abnormal vaginal discharge w/ itching/odor/irritation, headaches, visual changes, shortness of breath, chest pain, abdominal pain, severe nausea/vomiting, or problems with urination or bowel movements unless otherwise stated above. Pertinent History Reviewed:  Reviewed past medical,surgical, social, obstetrical and family history.  Reviewed problem list, medications and allergies. Physical Assessment:   Vitals:   11/28/19 1021  BP: 121/65  Pulse: 70  Weight: 179 lb 9.6 oz (81.5 kg)  Body mass index is 28.99 kg/m.           Physical  Examination:   General appearance: alert, well appearing, and in no distress  Mental status: alert, oriented to person, place, and time  Skin: warm & dry   Extremities: Edema: Trace    Cardiovascular: normal heart rate noted  Respiratory: normal respiratory effort, no distress  Abdomen: gravid, soft, non-tender  Pelvic: SSE: cx visually closed, no pooling, no change w/ valsalva         Fetal Status: Fetal Heart Rate (bpm): 126/144   Movement: Present    Fetal Surveillance Testing today: US[place]8+1 wks,DI/DI twins,cx 4.2 cm BABY A:female,breech left,BPP 8/8,SVP of fluid 3.8 cm,fhr 126 bpm,EFW 1053 g 13% BABY B:female,cephalic right,BPP 8/3/7,CWUf fluid 4.5 cm,fhr 144 bpm,EFW 1168 G 34%,discordance 9.8%  Chaperone: Peggy Dones    Results for orders placed or performed in visit on 11/28/19 (from the past 24 hour(s))  POC Urinalysis Dipstick OB   Collection Time: 11/28/19 10:25 AM  Result Value Ref Range   Color, UA     Clarity, UA     Glucose, UA Negative Negative   Bilirubin, UA     Ketones, UA neg    Spec Grav, UA     Blood, UA trace    pH, UA     POC,PROTEIN,UA Trace Negative, Trace, Small (1+), Moderate (2+), Large (3+), 4+   Urobilinogen, UA     Nitrite, UA neg    Leukocytes, UA Negative  Negative   Appearance     Odor      Assessment & Plan:  1) High-risk pregnancy G2P1001 at 37w1dwith an Estimated Date of Delivery: 02/19/20   2) Di-Di twins, stable, discordance 9.8%  3) ICP, stable on actigall 3060mTID  4) Prev c/s> for RCS w/ BTL (no mcaid)  Meds: No orders of the defined types were placed in this encounter.   Labs/procedures today: pn2, wants tdap next visit  Treatment Plan:  US[place]4wks     Weekly BPP @ 28wks then 2x/wk testing @ 32wks or weekly BPP     Deliver 37wks or as per MFM:____   Reviewed: Preterm labor symptoms and general obstetric precautions including but not limited to vaginal bleeding, contractions, leaking of fluid and fetal movement were reviewed in  detail with the patient.  All questions were answered. Has home bp cuff.  Check bp weekly, let usKoreanow if >140/90.   Follow-up: Return for As scheduled 1wk for bpp and HROB .  Orders Placed This Encounter  Procedures  . US[place]ETAL BPP WO NON STRESS  . POC Urinalysis Dipstick OB   KiRoma SchanzNM, WHHosp Metropolitano Dr Susoni/17/2021 10:55 AM

## 2019-11-28 NOTE — Progress Notes (Addendum)
[place] 28+1 wks,DI/DI twins,cx 4.2 cm BABY A:female,breech left,BPP 8/8,SVP of fluid 3.8 cm,fhr 126 bpm,EFW 1053 g 13% BABY B:female,cephalic right,BPP 9/3,HQS of fluid 4.5 cm,fhr 144 bpm,EFW 1168 G 34%,discordance 9.8%

## 2019-11-29 LAB — CBC
Hematocrit: 31.2 % — ABNORMAL LOW (ref 34.0–46.6)
Hemoglobin: 10.6 g/dL — ABNORMAL LOW (ref 11.1–15.9)
MCH: 30.5 pg (ref 26.6–33.0)
MCHC: 34 g/dL (ref 31.5–35.7)
MCV: 90 fL (ref 79–97)
Platelets: 177 10*3/uL (ref 150–450)
RBC: 3.48 x10E6/uL — ABNORMAL LOW (ref 3.77–5.28)
RDW: 13 % (ref 11.7–15.4)
WBC: 9.2 10*3/uL (ref 3.4–10.8)

## 2019-11-29 LAB — HIV ANTIBODY (ROUTINE TESTING W REFLEX): HIV Screen 4th Generation wRfx: NONREACTIVE

## 2019-11-29 LAB — GLUCOSE TOLERANCE, 2 HOURS W/ 1HR
Glucose, 1 hour: 122 mg/dL (ref 65–179)
Glucose, 2 hour: 71 mg/dL (ref 65–152)
Glucose, Fasting: 81 mg/dL (ref 65–91)

## 2019-11-29 LAB — ANTIBODY SCREEN: Antibody Screen: NEGATIVE

## 2019-11-29 LAB — RPR: RPR Ser Ql: NONREACTIVE

## 2019-12-02 ENCOUNTER — Other Ambulatory Visit: Payer: Self-pay | Admitting: Women's Health

## 2019-12-02 ENCOUNTER — Encounter: Payer: Self-pay | Admitting: *Deleted

## 2019-12-02 DIAGNOSIS — O0993 Supervision of high risk pregnancy, unspecified, third trimester: Secondary | ICD-10-CM

## 2019-12-02 DIAGNOSIS — O30043 Twin pregnancy, dichorionic/diamniotic, third trimester: Secondary | ICD-10-CM

## 2019-12-02 DIAGNOSIS — K831 Obstruction of bile duct: Secondary | ICD-10-CM

## 2019-12-02 DIAGNOSIS — O26643 Intrahepatic cholestasis of pregnancy, third trimester: Secondary | ICD-10-CM

## 2019-12-05 ENCOUNTER — Other Ambulatory Visit: Payer: No Typology Code available for payment source

## 2019-12-05 ENCOUNTER — Ambulatory Visit (INDEPENDENT_AMBULATORY_CARE_PROVIDER_SITE_OTHER): Payer: No Typology Code available for payment source

## 2019-12-05 ENCOUNTER — Other Ambulatory Visit: Payer: Self-pay

## 2019-12-05 ENCOUNTER — Ambulatory Visit (INDEPENDENT_AMBULATORY_CARE_PROVIDER_SITE_OTHER): Payer: No Typology Code available for payment source | Admitting: Obstetrics and Gynecology

## 2019-12-05 VITALS — BP 128/72 | HR 85 | Wt 185.4 lb

## 2019-12-05 DIAGNOSIS — O0990 Supervision of high risk pregnancy, unspecified, unspecified trimester: Secondary | ICD-10-CM | POA: Diagnosis not present

## 2019-12-05 DIAGNOSIS — O30043 Twin pregnancy, dichorionic/diamniotic, third trimester: Secondary | ICD-10-CM | POA: Diagnosis not present

## 2019-12-05 DIAGNOSIS — O26643 Intrahepatic cholestasis of pregnancy, third trimester: Secondary | ICD-10-CM

## 2019-12-05 DIAGNOSIS — O0993 Supervision of high risk pregnancy, unspecified, third trimester: Secondary | ICD-10-CM | POA: Diagnosis not present

## 2019-12-05 DIAGNOSIS — Z23 Encounter for immunization: Secondary | ICD-10-CM

## 2019-12-05 DIAGNOSIS — Z3A29 29 weeks gestation of pregnancy: Secondary | ICD-10-CM | POA: Diagnosis not present

## 2019-12-05 DIAGNOSIS — K831 Obstruction of bile duct: Secondary | ICD-10-CM

## 2019-12-05 DIAGNOSIS — Z331 Pregnant state, incidental: Secondary | ICD-10-CM | POA: Diagnosis not present

## 2019-12-05 DIAGNOSIS — O26613 Liver and biliary tract disorders in pregnancy, third trimester: Secondary | ICD-10-CM

## 2019-12-05 DIAGNOSIS — Z1389 Encounter for screening for other disorder: Secondary | ICD-10-CM | POA: Diagnosis not present

## 2019-12-05 LAB — POCT URINALYSIS DIPSTICK OB
Blood, UA: NEGATIVE
Glucose, UA: NEGATIVE
Ketones, UA: NEGATIVE
Leukocytes, UA: NEGATIVE
Nitrite, UA: NEGATIVE
POC,PROTEIN,UA: NEGATIVE

## 2019-12-05 NOTE — Progress Notes (Signed)
HIGH-RISK PREGNANCY VISIT Patient name: Maria Cooper MRN [id number]  Date of birth: 28-Feb-1996 Chief Complaint:   Routine Prenatal Visit (Ultrasound)  History of Present Illness:   Maria Cooper is a 24 y.o. G76P1001 female at [redacted]w[redacted]d(prior C-section) with an Estimated Date of Delivery: 02/19/20 being seen today for ongoing management of a high-risk pregnancy complicated by intrahepatic cholestasis of pregnancy currently on Actigall 300 mg TID and multiple gestation di-di twins. Today she reports significant improvement of pruritis.  [facility] 11/28/2019 08/07/2019 10/05/2017 07/02/2017 03/30/2017  Decreased Interest 0 0 0 0 0  Down, Depressed, Hopeless 0 0 0 0 0  PHQ - 2 Score 0 0 0 0 0  Altered sleeping 0 0 - 0 -  Tired, decreased energy 0 0 - 0 -  Change in appetite 0 0 - 0 -  Feeling bad or failure about yourself  0 0 - 0 -  Trouble concentrating 0 0 - 0 -  Moving slowly or fidgety/restless 0 0 - 0 -  Suicidal thoughts 0 0 - 0 -  PHQ-9 Score 0 0 - 0 -  Difficult doing work/chores - Not difficult at all - - -    Contractions: Irregular. Vag. Bleeding: None.  Movement: Present. denies leaking of fluid.  Review of Systems:   Pertinent items are noted in HPI Denies abnormal vaginal discharge w/ itching/odor/irritation, headaches, visual changes, shortness of breath, chest pain, abdominal pain, severe nausea/vomiting, or problems with urination or bowel movements unless otherwise stated above. Pertinent History Reviewed:  Reviewed past medical,surgical, social, obstetrical and family history.  Reviewed problem list, medications and allergies. Physical Assessment:   Vitals:   12/05/19 1030  BP: 128/72  Pulse: 85  Weight: 185 lb 6.4 oz (84.1 kg)  Body mass index is 29.92 kg/m.           Physical Examination:   General appearance: alert, well appearing, and in no distress  Mental status: alert, oriented to person, place, and time, normal mood, behavior, speech,  dress, motor activity, and thought processes  Skin: warm & dry   Extremities: Edema: Trace    Cardiovascular: normal heart rate noted  Respiratory: normal respiratory effort, no distress  Abdomen: gravid, soft, non-tender 33cm  Pelvic: Cervical exam deferred         Fetal Status:   Fundal Height: 33 cm Movement: Present    Fetal Surveillance Testing today: Ultrasound fht + x 2 vertex a , breech B  Chaperone: MClerance Lav   Results for orders placed or performed in visit on 12/05/19 (from the past 24 hour(s))  POC Urinalysis Dipstick OB   Collection Time: 12/05/19 10:26 AM  Result Value Ref Range   Color, UA     Clarity, UA     Glucose, UA Negative Negative   Bilirubin, UA     Ketones, UA n    Spec Grav, UA     Blood, UA n    pH, UA     POC,PROTEIN,UA Negative Negative, Trace, Small (1+), Moderate (2+), Large (3+), 4+   Urobilinogen, UA     Nitrite, UA n    Leukocytes, UA Negative Negative   Appearance     Odor      Assessment & Plan:  1) High-risk pregnancy G2P1001 at 293w1dith an Estimated Date of Delivery: 02/19/20   2) Di-Di twins, stable vertex/breech  3) ICP, stable on Acitgall 30064mID recheck Bile acid (nonfasting)  4) Previous c-section, stable.  Plans for repeat  5) Birth control after delivery, considering tubal ligation  Meds: No orders of the defined types were placed in this encounter.   Labs/procedures today: None  Treatment Plan: Continue routine obstetric care  Reviewed: Preterm labor symptoms and general obstetric precautions including but not limited to vaginal bleeding, contractions, leaking of fluid and fetal movement were reviewed in detail with the patient.  All questions were answered. Has home bp cuff. Check bp weekly, let us know if >140/90.   Follow-up: Return in about 1 week (around 12/12/2019) for BPP, HROB.  Orders Placed This Encounter  Procedures  . Tdap vaccine greater than or equal to 7yo IM  . POC Urinalysis Dipstick OB    By signing my name below, I, Clerance Lav, attest that this documentation has been prepared under the direction and in the presence of Jonnie Kind, MD. Electronically Signed: [place] [employer]. 12/05/19. 11:12 AM.  I personally performed the services described in this documentation, which was SCRIBED in my presence. The recorded information has been reviewed and considered accurate. It has been edited as necessary during review. Jonnie Kind, MD \

## 2019-12-05 NOTE — Progress Notes (Signed)
[place] 29+1 wks,DI/DI twins,CX 3.7 cm,normal ovaries BABY A,breech inferior left,BPP 8/8,anterior placenta gr 1,svp of fluid 4.2 cm,fhr 140 bpm BABY B,cephalic superior right,BPP 8/8,anterior placenta gr 1,svp of fluid 4.5 cm,fhr 142 bpm

## 2019-12-07 LAB — COMPREHENSIVE METABOLIC PANEL
ALT: 8 IU/L (ref 0–32)
AST: 16 IU/L (ref 0–40)
Albumin/Globulin Ratio: 1.5 (ref 1.2–2.2)
Albumin: 3.7 g/dL — ABNORMAL LOW (ref 3.9–5.0)
Alkaline Phosphatase: 134 IU/L — ABNORMAL HIGH (ref 44–121)
BUN/Creatinine Ratio: 15 (ref 9–23)
BUN: 8 mg/dL (ref 6–20)
Bilirubin Total: 0.2 mg/dL (ref 0.0–1.2)
CO2: 22 mmol/L (ref 20–29)
Calcium: 9.3 mg/dL (ref 8.7–10.2)
Chloride: 103 mmol/L (ref 96–106)
Creatinine, Ser: 0.52 mg/dL — ABNORMAL LOW (ref 0.57–1.00)
GFR calc Af Amer: 154 mL/min/{1.73_m2} (ref >59)
GFR calc non Af Amer: 133 mL/min/{1.73_m2} (ref >59)
Globulin, Total: 2.4 g/dL (ref 1.5–4.5)
Glucose: 78 mg/dL (ref 65–99)
Potassium: 4.2 mmol/L (ref 3.5–5.2)
Sodium: 137 mmol/L (ref 134–144)
Total Protein: 6.1 g/dL (ref 6.0–8.5)

## 2019-12-07 LAB — BILE ACIDS, TOTAL: Bile Acids Total: 6.9 umol/L (ref 0.0–10.0)

## 2019-12-08 NOTE — Progress Notes (Signed)
Excellent recovery of bile acids on Actigall 300 mg tid.,  wass 79.5, now nl at 6.9

## 2019-12-12 ENCOUNTER — Ambulatory Visit: Payer: No Typology Code available for payment source | Attending: Women's Health

## 2019-12-12 ENCOUNTER — Other Ambulatory Visit: Payer: Self-pay

## 2019-12-12 ENCOUNTER — Encounter: Payer: Self-pay | Admitting: *Deleted

## 2019-12-12 ENCOUNTER — Ambulatory Visit: Payer: No Typology Code available for payment source | Admitting: *Deleted

## 2019-12-12 DIAGNOSIS — O321XX1 Maternal care for breech presentation, fetus 1: Secondary | ICD-10-CM

## 2019-12-12 DIAGNOSIS — O0990 Supervision of high risk pregnancy, unspecified, unspecified trimester: Secondary | ICD-10-CM | POA: Diagnosis present

## 2019-12-12 DIAGNOSIS — O30043 Twin pregnancy, dichorionic/diamniotic, third trimester: Secondary | ICD-10-CM

## 2019-12-12 DIAGNOSIS — O26619 Liver and biliary tract disorders in pregnancy, unspecified trimester: Secondary | ICD-10-CM | POA: Insufficient documentation

## 2019-12-12 DIAGNOSIS — Z3A30 30 weeks gestation of pregnancy: Secondary | ICD-10-CM

## 2019-12-12 DIAGNOSIS — O34219 Maternal care for unspecified type scar from previous cesarean delivery: Secondary | ICD-10-CM | POA: Diagnosis not present

## 2019-12-12 DIAGNOSIS — K831 Obstruction of bile duct: Secondary | ICD-10-CM | POA: Diagnosis present

## 2019-12-12 DIAGNOSIS — O26613 Liver and biliary tract disorders in pregnancy, third trimester: Secondary | ICD-10-CM

## 2019-12-12 DIAGNOSIS — O321XX2 Maternal care for breech presentation, fetus 2: Secondary | ICD-10-CM | POA: Diagnosis not present

## 2019-12-18 ENCOUNTER — Other Ambulatory Visit: Payer: Self-pay | Admitting: Women's Health

## 2019-12-18 DIAGNOSIS — O30002 Twin pregnancy, unspecified number of placenta and unspecified number of amniotic sacs, second trimester: Secondary | ICD-10-CM

## 2019-12-18 DIAGNOSIS — K831 Obstruction of bile duct: Secondary | ICD-10-CM

## 2019-12-18 DIAGNOSIS — O26612 Liver and biliary tract disorders in pregnancy, second trimester: Secondary | ICD-10-CM

## 2019-12-19 ENCOUNTER — Ambulatory Visit (INDEPENDENT_AMBULATORY_CARE_PROVIDER_SITE_OTHER): Payer: No Typology Code available for payment source | Admitting: Obstetrics & Gynecology

## 2019-12-19 ENCOUNTER — Ambulatory Visit (INDEPENDENT_AMBULATORY_CARE_PROVIDER_SITE_OTHER): Payer: No Typology Code available for payment source

## 2019-12-19 ENCOUNTER — Encounter: Payer: Self-pay | Admitting: Obstetrics & Gynecology

## 2019-12-19 VITALS — BP 122/76 | HR 102 | Wt 192.0 lb

## 2019-12-19 DIAGNOSIS — O0993 Supervision of high risk pregnancy, unspecified, third trimester: Secondary | ICD-10-CM

## 2019-12-19 DIAGNOSIS — O30002 Twin pregnancy, unspecified number of placenta and unspecified number of amniotic sacs, second trimester: Secondary | ICD-10-CM | POA: Diagnosis not present

## 2019-12-19 DIAGNOSIS — K831 Obstruction of bile duct: Secondary | ICD-10-CM

## 2019-12-19 DIAGNOSIS — O26612 Liver and biliary tract disorders in pregnancy, second trimester: Secondary | ICD-10-CM | POA: Diagnosis not present

## 2019-12-19 DIAGNOSIS — O30042 Twin pregnancy, dichorionic/diamniotic, second trimester: Secondary | ICD-10-CM

## 2019-12-19 DIAGNOSIS — Z23 Encounter for immunization: Secondary | ICD-10-CM | POA: Diagnosis not present

## 2019-12-19 DIAGNOSIS — Z3A31 31 weeks gestation of pregnancy: Secondary | ICD-10-CM | POA: Diagnosis not present

## 2019-12-19 DIAGNOSIS — O26613 Liver and biliary tract disorders in pregnancy, third trimester: Secondary | ICD-10-CM

## 2019-12-19 DIAGNOSIS — O0990 Supervision of high risk pregnancy, unspecified, unspecified trimester: Secondary | ICD-10-CM

## 2019-12-19 DIAGNOSIS — Z331 Pregnant state, incidental: Secondary | ICD-10-CM

## 2019-12-19 DIAGNOSIS — Z1389 Encounter for screening for other disorder: Secondary | ICD-10-CM

## 2019-12-19 LAB — POCT URINALYSIS DIPSTICK OB
Blood, UA: NEGATIVE
Glucose, UA: NEGATIVE
Ketones, UA: NEGATIVE
Leukocytes, UA: NEGATIVE
Nitrite, UA: NEGATIVE
POC,PROTEIN,UA: NEGATIVE

## 2019-12-19 MED FILL — URSODIOL 300 MG CAPSULE: 300 | 30 days supply | Qty: 90 | Fill #0

## 2019-12-19 MED FILL — PRENATAL 27-1 MG TABS: 27-1 | 30 days supply | Qty: 30 | Fill #2

## 2019-12-19 NOTE — Progress Notes (Signed)
[place] 31+1 wks,DI/DI twins BABY A:cephalic,anterior placenta gr 1,SVP 6.3 cm,FHR 142 bpm BABY B:cephalic,anterior placenta gr 1,FHR 135 bpm,SVP 5.3 cm

## 2019-12-19 NOTE — Progress Notes (Signed)
HIGH-RISK PREGNANCY VISIT Patient name: Maria Cooper MRN [id number]  Date of birth: December 09, 1995 Chief Complaint:   High Risk Gestation ([place] today)  History of Present Illness:   Maria Cooper is a 24 y.o. G76P1001 female at [redacted]w[redacted]d with an Estimated Date of Delivery: 02/19/20 being seen today for ongoing management of a high-risk pregnancy complicated by Twins with ICP.  Today she reports no complaints.  Depression screen [facility] 2/9 11/28/2019 08/07/2019 10/05/2017 07/02/2017 03/30/2017  Decreased Interest 0 0 0 0 0  Down, Depressed, Hopeless 0 0 0 0 0  PHQ - 2 Score 0 0 0 0 0  Altered sleeping 0 0 - 0 -  Tired, decreased energy 0 0 - 0 -  Change in appetite 0 0 - 0 -  Feeling bad or failure about yourself  0 0 - 0 -  Trouble concentrating 0 0 - 0 -  Moving slowly or fidgety/restless 0 0 - 0 -  Suicidal thoughts 0 0 - 0 -  PHQ-9 Score 0 0 - 0 -  Difficult doing work/chores - Not difficult at all - - -    Contractions: Irregular. Vag. Bleeding: None.  Movement: Present. denies leaking of fluid.  Review of Systems:   Pertinent items are noted in HPI Denies abnormal vaginal discharge w/ itching/odor/irritation, headaches, visual changes, shortness of breath, chest pain, abdominal pain, severe nausea/vomiting, or problems with urination or bowel movements unless otherwise stated above. Pertinent History Reviewed:  Reviewed past medical,surgical, social, obstetrical and family history.  Reviewed problem list, medications and allergies. Physical Assessment:   Vitals:   12/19/19 1029  BP: 122/76  Pulse: (!) 102  Weight: 192 lb (87.1 kg)  Body mass index is 30.99 kg/m.           Physical Examination:   General appearance: alert, well appearing, and in no distress  Mental status: alert, oriented to person, place, and time  Skin: warm & dry   Extremities: Edema: Trace    Cardiovascular: normal heart rate noted  Respiratory: normal respiratory effort, no distress  Abdomen: gravid,  soft, non-tender  Pelvic: Cervical exam deferred         Fetal Status:     Movement: Present    Fetal Surveillance Testing today: BPP x 2 8/8   Chaperone: n/a    Results for orders placed or performed in visit on 12/19/19 (from the past 24 hour(s))  POC Urinalysis Dipstick OB   Collection Time: 12/19/19 10:33 AM  Result Value Ref Range   Color, UA     Clarity, UA     Glucose, UA Negative Negative   Bilirubin, UA     Ketones, UA neg    Spec Grav, UA     Blood, UA neg    pH, UA     POC,PROTEIN,UA Negative Negative, Trace, Small (1+), Moderate (2+), Large (3+), 4+   Urobilinogen, UA     Nitrite, UA neg    Leukocytes, UA Negative Negative   Appearance     Odor      Assessment & Plan:  1) High-risk pregnancy G2P1001 at [redacted]w[redacted]d with an Estimated Date of Delivery: 02/19/20   2) DiDi twins, stable  3) ICP, stable  Meds: No orders of the defined types were placed in this encounter.   Labs/procedures today: BPP 8/8 x 2  Treatment Plan:  Weekly BPP, repeat C section 01/23/20  Reviewed: Preterm labor symptoms and general obstetric precautions including but not limited to vaginal bleeding, contractions,  leaking of fluid and fetal movement were reviewed in detail with the patient.  All questions were answered. Has home bp cuff. Rx faxed to . Check bp weekly, let us know if >140/90.   Follow-up: No follow-ups on file.  Orders Placed This Encounter  Procedures   Flu Vaccine QUAD 36+ mos IM   POC Urinalysis Dipstick OB   Amaryllis Dyke Izaiha Lo  12/19/2019 10:54 AM

## 2019-12-25 ENCOUNTER — Other Ambulatory Visit: Payer: Self-pay | Admitting: Obstetrics & Gynecology

## 2019-12-25 DIAGNOSIS — O26613 Liver and biliary tract disorders in pregnancy, third trimester: Secondary | ICD-10-CM

## 2019-12-25 DIAGNOSIS — O30043 Twin pregnancy, dichorionic/diamniotic, third trimester: Secondary | ICD-10-CM

## 2019-12-25 DIAGNOSIS — K831 Obstruction of bile duct: Secondary | ICD-10-CM

## 2019-12-26 ENCOUNTER — Other Ambulatory Visit: Payer: Self-pay

## 2019-12-26 ENCOUNTER — Encounter: Payer: Self-pay | Admitting: Obstetrics & Gynecology

## 2019-12-26 ENCOUNTER — Ambulatory Visit (INDEPENDENT_AMBULATORY_CARE_PROVIDER_SITE_OTHER): Payer: No Typology Code available for payment source | Admitting: Obstetrics & Gynecology

## 2019-12-26 ENCOUNTER — Ambulatory Visit (INDEPENDENT_AMBULATORY_CARE_PROVIDER_SITE_OTHER): Payer: No Typology Code available for payment source

## 2019-12-26 VITALS — BP 129/76 | HR 94 | Wt 194.0 lb

## 2019-12-26 DIAGNOSIS — Z3A32 32 weeks gestation of pregnancy: Secondary | ICD-10-CM

## 2019-12-26 DIAGNOSIS — O0993 Supervision of high risk pregnancy, unspecified, third trimester: Secondary | ICD-10-CM

## 2019-12-26 DIAGNOSIS — Z1389 Encounter for screening for other disorder: Secondary | ICD-10-CM

## 2019-12-26 DIAGNOSIS — O30043 Twin pregnancy, dichorionic/diamniotic, third trimester: Secondary | ICD-10-CM

## 2019-12-26 DIAGNOSIS — O26613 Liver and biliary tract disorders in pregnancy, third trimester: Secondary | ICD-10-CM

## 2019-12-26 DIAGNOSIS — Z331 Pregnant state, incidental: Secondary | ICD-10-CM

## 2019-12-26 DIAGNOSIS — O0990 Supervision of high risk pregnancy, unspecified, unspecified trimester: Secondary | ICD-10-CM

## 2019-12-26 DIAGNOSIS — K831 Obstruction of bile duct: Secondary | ICD-10-CM | POA: Diagnosis not present

## 2019-12-26 LAB — POCT URINALYSIS DIPSTICK OB
Blood, UA: NEGATIVE
Glucose, UA: NEGATIVE
Ketones, UA: NEGATIVE
Leukocytes, UA: NEGATIVE
Nitrite, UA: NEGATIVE
POC,PROTEIN,UA: NEGATIVE

## 2019-12-26 NOTE — Progress Notes (Signed)
[place] 32+1 wks,DI/DI twins BABY A:breech left,anterior placenta gr 1,BPP 8/8,SVP of fluid 6.3 cm,fhr 123 bpm,EFW 1762 g 20%,discordance 22% BABY B:cephalic right,anterior placenta gr 1,BPP 8/8,SVP of fluid 5.1 cm,fhr 142 bpm,EFW 2012 54%

## 2019-12-26 NOTE — Progress Notes (Signed)
HIGH-RISK PREGNANCY VISIT Patient name: Maria Cooper MRN [id number]  Date of birth: 1995/09/10 Chief Complaint:   Routine Prenatal Visit (u/s)  History of Present Illness:   Maria Cooper is a 24 y.o. G12P1001 female at 74w1dwith an Estimated Date of Delivery: 02/19/20 being seen today for ongoing management of a high-risk pregnancy complicated by DiDi twins with ICP.  Today she reports vision has been changing.  [facility]/28[facility]2019 08/07/2019 10/05/2017 07/02/2017 03/30/2017  Decreased Interest 0 0 0 0 0  Down, Depressed, Hopeless 0 0 0 0 0  PHQ - 2 Score 0 0 0 0 0  Altered sleeping 0 0 - 0 -  Tired, decreased energy 0 0 - 0 -  Change in appetite 0 0 - 0 -  Feeling bad or failure about yourself  0 0 - 0 -  Trouble concentrating 0 0 - 0 -  Moving slowly or fidgety/restless 0 0 - 0 -  Suicidal thoughts 0 0 - 0 -  PHQ-9 Score 0 0 - 0 -  Difficult doing work/chores - Not difficult at all - - -    Contractions: Irregular. Vag. Bleeding: None.  Movement: Present. denies leaking of fluid.  Review of Systems:   Pertinent items are noted in HPI Denies abnormal vaginal discharge w/ itching/odor/irritation, headaches, visual changes, shortness of breath, chest pain, abdominal pain, severe nausea/vomiting, or problems with urination or bowel movements unless otherwise stated above. Pertinent History Reviewed:  Reviewed past medical,surgical, social, obstetrical and family history.  Reviewed problem list, medications and allergies. Physical Assessment:   Vitals:   12/26/19 0934  BP: 129/76  Pulse: 94  Weight: 194 lb (88 kg)  Body mass index is 31.31 kg/m.           Physical Examination:   General appearance: alert, well appearing, and in no distress  Mental status: alert, oriented to person, place, and time  Skin: warm & dry   Extremities: Edema: Trace    Cardiovascular: normal heart rate noted  Respiratory: normal respiratory effort, no distress  Abdomen:  gravid, soft, non-tender  Pelvic: Cervical exam deferred         Fetal Status:     Movement: Present    Fetal Surveillance Testing today: sonogram BPP 8/8 x2 with both twins growing normally, 20%/54%, 12% discordance   Chaperone: n/a    Results for orders placed or performed in visit on 12/26/19 (from the past 24 hour(s))  POC Urinalysis Dipstick OB   Collection Time: 12/26/19  9:35 AM  Result Value Ref Range   Color, UA     Clarity, UA     Glucose, UA Negative Negative   Bilirubin, UA     Ketones, UA neg    Spec Grav, UA     Blood, UA neg    pH, UA     POC,PROTEIN,UA Negative Negative, Trace, Small (1+), Moderate (2+), Large (3+), 4+   Urobilinogen, UA     Nitrite, UA neg    Leukocytes, UA Negative Negative   Appearance     Odor      Assessment & Plan:  1) High-risk pregnancy G2P1001 at 355w1dith an Estimated Date of Delivery: 02/19/20   2)DiDi twins, stable, sonogram is normal  3) ICP, BPP 8/8 x 2  Meds: No orders of the defined types were placed in this encounter.   Labs/procedures today: sonogram  Treatment Plan:  Weekly BPPs, Growth scan at 35 weeks, plan IOL at 362  weeks or as clinically indicated  Reviewed: Preterm labor symptoms and general obstetric precautions including but not limited to vaginal bleeding, contractions, leaking of fluid and fetal movement were reviewed in detail with the patient.  All questions were answered. Has home bp cuff. Rx faxed to . Check bp weekly, let us know if >140/90.   Follow-up: Return in about 1 week (around 01/02/2020) for BPP/sono, HROB.  Orders Placed This Encounter  Procedures  . POC Urinalysis Dipstick OB   Florian Buff  12/26/2019 9:48 AM

## 2019-12-29 ENCOUNTER — Telehealth: Payer: Self-pay | Admitting: Obstetrics & Gynecology

## 2019-12-29 NOTE — Telephone Encounter (Signed)
Patient states [facility] paperwork stated that her start date was 12/26/19 but patient states her actual start date was 12/27/2019 pt requested for it to be changed.

## 2019-12-31 ENCOUNTER — Encounter: Payer: Self-pay | Admitting: Obstetrics and Gynecology

## 2019-12-31 ENCOUNTER — Ambulatory Visit: Payer: No Typology Code available for payment source | Admitting: Obstetrics and Gynecology

## 2019-12-31 ENCOUNTER — Other Ambulatory Visit: Payer: Self-pay

## 2019-12-31 VITALS — BP 133/77 | HR 105 | Wt 195.2 lb

## 2019-12-31 DIAGNOSIS — Z3009 Encounter for other general counseling and advice on contraception: Secondary | ICD-10-CM

## 2019-12-31 DIAGNOSIS — K831 Obstruction of bile duct: Secondary | ICD-10-CM

## 2019-12-31 DIAGNOSIS — O0990 Supervision of high risk pregnancy, unspecified, unspecified trimester: Secondary | ICD-10-CM

## 2019-12-31 DIAGNOSIS — Z98891 History of uterine scar from previous surgery: Secondary | ICD-10-CM

## 2019-12-31 DIAGNOSIS — O26619 Liver and biliary tract disorders in pregnancy, unspecified trimester: Secondary | ICD-10-CM

## 2019-12-31 DIAGNOSIS — O30043 Twin pregnancy, dichorionic/diamniotic, third trimester: Secondary | ICD-10-CM

## 2019-12-31 DIAGNOSIS — Z331 Pregnant state, incidental: Secondary | ICD-10-CM

## 2019-12-31 DIAGNOSIS — Z1389 Encounter for screening for other disorder: Secondary | ICD-10-CM

## 2019-12-31 LAB — POCT URINALYSIS DIPSTICK OB
Blood, UA: NEGATIVE
Glucose, UA: NEGATIVE
Ketones, UA: NEGATIVE
Leukocytes, UA: NEGATIVE
Nitrite, UA: NEGATIVE

## 2019-12-31 NOTE — Progress Notes (Signed)
Subjective:  Maria Cooper is a 24 y.o. G2P1001 at 79w6dbeing seen today for ongoing prenatal care.  She is currently monitored for the following issues for this high-risk pregnancy and has Dyspepsia; Esophageal reflux; Celiac sprue; S/P cesarean section; Supervision of high risk pregnancy, antepartum; Twin gestation, dichorionic diamniotic; Sinus tachycardia; and Cholestasis of pregnancy, antepartum on their problem list.  Patient reports general discomforts of pregnancy.  Contractions: Irregular. Vag. Bleeding: None.  Movement: Present. Denies leaking of fluid.   The following portions of the patient's history were reviewed and updated as appropriate: allergies, current medications, past family history, past medical history, past social history, past surgical history and problem list. Problem list updated.  Objective:   Vitals:   12/31/19 1103  BP: 133/77  Pulse: (!) 105  Weight: 195 lb 3.2 oz (88.5 kg)    Fetal Status:     Movement: Present     General:  Alert, oriented and cooperative. Patient is in no acute distress.  Skin: Skin is warm and dry. No rash noted.   Cardiovascular: Normal heart rate noted  Respiratory: Normal respiratory effort, no problems with respiration noted  Abdomen: Soft, gravid, appropriate for gestational age. Pain/Pressure: Present     Pelvic:  Cervical exam deferred        Extremities: Normal range of motion.  Edema: Trace  Mental Status: Normal mood and affect. Normal behavior. Normal judgment and thought content.   Urinalysis:      Assessment and Plan:  Pregnancy: G2P1001 at 373w6d1. Supervision of high risk pregnancy, antepartum Stable - POC Urinalysis Dipstick OB  2. Dichorionic diamniotic twin pregnancy in third trimester Stable Appropriate interval growth on last scan  3. Cholestasis of pregnancy, antepartum Stable Continue with Actigall and weekly BPP  4. S/P cesarean section Desires repeat   6. Pregnant state,  incidental   7. Pregnancy, incidental  - POC Urinalysis Dipstick OB  8. Screening for genitourinary condition  - POC Urinalysis Dipstick OB  Preterm labor symptoms and general obstetric precautions including but not limited to vaginal bleeding, contractions, leaking of fluid and fetal movement were reviewed in detail with the patient. Please refer to After Visit Summary for other counseling recommendations.  Return for already has U/S and OB appts.   ErChancy MilroyMD

## 2019-12-31 NOTE — Patient Instructions (Signed)
Third Trimester of Pregnancy The third trimester is from week 28 through week 40 (months 7 through 9). The third trimester is a time when the unborn baby (fetus) is growing rapidly. At the end of the ninth month, the fetus is about 20 inches in length and weighs 6-10 pounds. Body changes during your third trimester Your body will continue to go through many changes during pregnancy. The changes vary from woman to woman. During the third trimester:  Your weight will continue to increase. You can expect to gain 25-35 pounds (11-16 kg) by the end of the pregnancy.  You may begin to get stretch marks on your hips, abdomen, and breasts.  You may urinate more often because the fetus is moving lower into your pelvis and pressing on your bladder.  You may develop or continue to have heartburn. This is caused by increased hormones that slow down muscles in the digestive tract.  You may develop or continue to have constipation because increased hormones slow digestion and cause the muscles that push waste through your intestines to relax.  You may develop hemorrhoids. These are swollen veins (varicose veins) in the rectum that can itch or be painful.  You may develop swollen, bulging veins (varicose veins) in your legs.  You may have increased body aches in the pelvis, back, or thighs. This is due to weight gain and increased hormones that are relaxing your joints.  You may have changes in your hair. These can include thickening of your hair, rapid growth, and changes in texture. Some women also have hair loss during or after pregnancy, or hair that feels dry or thin. Your hair will most likely return to normal after your baby is born.  Your breasts will continue to grow and they will continue to become tender. A yellow fluid (colostrum) may leak from your breasts. This is the first milk you are producing for your baby.  Your belly button may stick out.  You may notice more swelling in your hands,  face, or ankles.  You may have increased tingling or numbness in your hands, arms, and legs. The skin on your belly may also feel numb.  You may feel short of breath because of your expanding uterus.  You may have more problems sleeping. This can be caused by the size of your belly, increased need to urinate, and an increase in your body's metabolism.  You may notice the fetus "dropping," or moving lower in your abdomen (lightening).  You may have increased vaginal discharge.  You may notice your joints feel loose and you may have pain around your pelvic bone. What to expect at prenatal visits You will have prenatal exams every 2 weeks until week 36. Then you will have weekly prenatal exams. During a routine prenatal visit:  You will be weighed to make sure you and the baby are growing normally.  Your blood pressure will be taken.  Your abdomen will be measured to track your baby's growth.  The fetal heartbeat will be listened to.  Any test results from the previous visit will be discussed.  You may have a cervical check near your due date to see if your cervix has softened or thinned (effaced).  You will be tested for Group B streptococcus. This happens between 35 and 37 weeks. Your health care provider may ask you:  What your birth plan is.  How you are feeling.  If you are feeling the baby move.  If you have had any abnormal  symptoms, such as leaking fluid, bleeding, severe headaches, or abdominal cramping.  If you are using any tobacco products, including cigarettes, chewing tobacco, and electronic cigarettes.  If you have any questions. Other tests or screenings that may be performed during your third trimester include:  Blood tests that check for low iron levels (anemia).  Fetal testing to check the health, activity level, and growth of the fetus. Testing is done if you have certain medical conditions or if there are problems during the pregnancy.  Nonstress test  (NST). This test checks the health of your baby to make sure there are no signs of problems, such as the baby not getting enough oxygen. During this test, a belt is placed around your belly. The baby is made to move, and its heart rate is monitored during movement. What is false labor? False labor is a condition in which you feel small, irregular tightenings of the muscles in the womb (contractions) that usually go away with rest, changing position, or drinking water. These are called Braxton Hicks contractions. Contractions may last for hours, days, or even weeks before true labor sets in. If contractions come at regular intervals, become more frequent, increase in intensity, or become painful, you should see your health care provider. What are the signs of labor?  Abdominal cramps.  Regular contractions that start at 10 minutes apart and become stronger and more frequent with time.  Contractions that start on the top of the uterus and spread down to the lower abdomen and back.  Increased pelvic pressure and dull back pain.  A watery or bloody mucus discharge that comes from the vagina.  Leaking of amniotic fluid. This is also known as your "water breaking." It could be a slow trickle or a gush. Let your health care provider know if it has a color or strange odor. If you have any of these signs, call your health care provider right away, even if it is before your due date. Follow these instructions at home: Medicines  Follow your health care provider's instructions regarding medicine use. Specific medicines may be either safe or unsafe to take during pregnancy.  Take a prenatal vitamin that contains at least 600 micrograms (mcg) of folic acid.  If you develop constipation, try taking a stool softener if your health care provider approves. Eating and drinking   Eat a balanced diet that includes fresh fruits and vegetables, whole grains, good sources of protein such as meat, eggs, or tofu,  and low-fat dairy. Your health care provider will help you determine the amount of weight gain that is right for you.  Avoid raw meat and uncooked cheese. These carry germs that can cause birth defects in the baby.  If you have low calcium intake from food, talk to your health care provider about whether you should take a daily calcium supplement.  Eat four or five small meals rather than three large meals a day.  Limit foods that are high in fat and processed sugars, such as fried and sweet foods.  To prevent constipation: ? Drink enough fluid to keep your urine clear or pale yellow. ? Eat foods that are high in fiber, such as fresh fruits and vegetables, whole grains, and beans. Activity  Exercise only as directed by your health care provider. Most women can continue their usual exercise routine during pregnancy. Try to exercise for 30 minutes at least 5 days a week. Stop exercising if you experience uterine contractions.  Avoid heavy lifting.  Do  not exercise in extreme heat or humidity, or at high altitudes.  Wear low-heel, comfortable shoes.  Practice good posture.  You may continue to have sex unless your health care provider tells you otherwise. Relieving pain and discomfort  Take frequent breaks and rest with your legs elevated if you have leg cramps or low back pain.  Take warm sitz baths to soothe any pain or discomfort caused by hemorrhoids. Use hemorrhoid cream if your health care provider approves.  Wear a good support bra to prevent discomfort from breast tenderness.  If you develop varicose veins: ? Wear support pantyhose or compression stockings as told by your healthcare provider. ? Elevate your feet for 15 minutes, 3-4 times a day. Prenatal care  Write down your questions. Take them to your prenatal visits.  Keep all your prenatal visits as told by your health care provider. This is important. Safety  Wear your seat belt at all times when driving.  Make  a list of emergency phone numbers, including numbers for family, friends, the hospital, and police and fire departments. General instructions  Avoid cat litter boxes and soil used by cats. These carry germs that can cause birth defects in the baby. If you have a cat, ask someone to clean the litter box for you.  Do not travel far distances unless it is absolutely necessary and only with the approval of your health care provider.  Do not use hot tubs, steam rooms, or saunas.  Do not drink alcohol.  Do not use any products that contain nicotine or tobacco, such as cigarettes and e-cigarettes. If you need help quitting, ask your health care provider.  Do not use any medicinal herbs or unprescribed drugs. These chemicals affect the formation and growth of the baby.  Do not douche or use tampons or scented sanitary pads.  Do not cross your legs for long periods of time.  To prepare for the arrival of your baby: ? Take prenatal classes to understand, practice, and ask questions about labor and delivery. ? Make a trial run to the hospital. ? Visit the hospital and tour the maternity area. ? Arrange for maternity or paternity leave through employers. ? Arrange for family and friends to take care of pets while you are in the hospital. ? Purchase a rear-facing car seat and make sure you know how to install it in your car. ? Pack your hospital bag. ? Prepare the baby's nursery. Make sure to remove all pillows and stuffed animals from the baby's crib to prevent suffocation.  Visit your dentist if you have not gone during your pregnancy. Use a soft toothbrush to brush your teeth and be gentle when you floss. Contact a health care provider if:  You are unsure if you are in labor or if your water has broken.  You become dizzy.  You have mild pelvic cramps, pelvic pressure, or nagging pain in your abdominal area.  You have lower back pain.  You have persistent nausea, vomiting, or  diarrhea.  You have an unusual or bad smelling vaginal discharge.  You have pain when you urinate. Get help right away if:  Your water breaks before 37 weeks.  You have regular contractions less than 5 minutes apart before 37 weeks.  You have a fever.  You are leaking fluid from your vagina.  You have spotting or bleeding from your vagina.  You have severe abdominal pain or cramping.  You have rapid weight loss or weight gain.  You have  shortness of breath with chest pain.  You notice sudden or extreme swelling of your face, hands, ankles, feet, or legs.  Your baby makes fewer than 10 movements in 2 hours.  You have severe headaches that do not go away when you take medicine.  You have vision changes. Summary  The third trimester is from week 28 through week 40, months 7 through 9. The third trimester is a time when the unborn baby (fetus) is growing rapidly.  During the third trimester, your discomfort may increase as you and your baby continue to gain weight. You may have abdominal, leg, and back pain, sleeping problems, and an increased need to urinate.  During the third trimester your breasts will keep growing and they will continue to become tender. A yellow fluid (colostrum) may leak from your breasts. This is the first milk you are producing for your baby.  False labor is a condition in which you feel small, irregular tightenings of the muscles in the womb (contractions) that eventually go away. These are called Braxton Hicks contractions. Contractions may last for hours, days, or even weeks before true labor sets in.  Signs of labor can include: abdominal cramps; regular contractions that start at 10 minutes apart and become stronger and more frequent with time; watery or bloody mucus discharge that comes from the vagina; increased pelvic pressure and dull back pain; and leaking of amniotic fluid. This information is not intended to replace advice given to you by your  health care provider. Make sure you discuss any questions you have with your health care provider. Document Revised: 06/20/2018 Document Reviewed: 04/04/2016 Elsevier Patient Education  [place].

## 2020-01-01 ENCOUNTER — Other Ambulatory Visit: Payer: Self-pay | Admitting: Obstetrics & Gynecology

## 2020-01-01 DIAGNOSIS — O30043 Twin pregnancy, dichorionic/diamniotic, third trimester: Secondary | ICD-10-CM

## 2020-01-01 DIAGNOSIS — K831 Obstruction of bile duct: Secondary | ICD-10-CM

## 2020-01-01 DIAGNOSIS — O26643 Intrahepatic cholestasis of pregnancy, third trimester: Secondary | ICD-10-CM

## 2020-01-02 ENCOUNTER — Other Ambulatory Visit: Payer: Self-pay

## 2020-01-02 ENCOUNTER — Ambulatory Visit (INDEPENDENT_AMBULATORY_CARE_PROVIDER_SITE_OTHER): Payer: No Typology Code available for payment source

## 2020-01-02 DIAGNOSIS — Z3A33 33 weeks gestation of pregnancy: Secondary | ICD-10-CM

## 2020-01-02 DIAGNOSIS — O30043 Twin pregnancy, dichorionic/diamniotic, third trimester: Secondary | ICD-10-CM | POA: Diagnosis not present

## 2020-01-02 DIAGNOSIS — O26613 Liver and biliary tract disorders in pregnancy, third trimester: Secondary | ICD-10-CM

## 2020-01-02 DIAGNOSIS — O0990 Supervision of high risk pregnancy, unspecified, unspecified trimester: Secondary | ICD-10-CM

## 2020-01-02 DIAGNOSIS — K831 Obstruction of bile duct: Secondary | ICD-10-CM

## 2020-01-02 NOTE — Progress Notes (Signed)
[place] DI/DI Twins BABY A:breech left,female,BPP 8/8,anterior placenta gr 1,SVP of fluid 5.7 cm,fhr 161 bpm BABY B:cephalic right,female,BPP 8/8,anterior placenta gr 1,SVP of fluid 7.2 cm,fhr 152 bpm,

## 2020-01-08 ENCOUNTER — Other Ambulatory Visit: Payer: Self-pay | Admitting: Obstetrics and Gynecology

## 2020-01-08 DIAGNOSIS — K831 Obstruction of bile duct: Secondary | ICD-10-CM

## 2020-01-08 DIAGNOSIS — O30043 Twin pregnancy, dichorionic/diamniotic, third trimester: Secondary | ICD-10-CM

## 2020-01-09 ENCOUNTER — Ambulatory Visit (INDEPENDENT_AMBULATORY_CARE_PROVIDER_SITE_OTHER): Payer: No Typology Code available for payment source | Admitting: Women's Health

## 2020-01-09 ENCOUNTER — Encounter (HOSPITAL_COMMUNITY): Payer: Self-pay

## 2020-01-09 ENCOUNTER — Ambulatory Visit (INDEPENDENT_AMBULATORY_CARE_PROVIDER_SITE_OTHER): Payer: No Typology Code available for payment source

## 2020-01-09 ENCOUNTER — Encounter: Payer: Self-pay | Admitting: Women's Health

## 2020-01-09 VITALS — BP 127/68 | HR 89 | Wt 199.0 lb

## 2020-01-09 DIAGNOSIS — O26619 Liver and biliary tract disorders in pregnancy, unspecified trimester: Secondary | ICD-10-CM

## 2020-01-09 DIAGNOSIS — Z3480 Encounter for supervision of other normal pregnancy, unspecified trimester: Secondary | ICD-10-CM

## 2020-01-09 DIAGNOSIS — O26613 Liver and biliary tract disorders in pregnancy, third trimester: Secondary | ICD-10-CM

## 2020-01-09 DIAGNOSIS — O0993 Supervision of high risk pregnancy, unspecified, third trimester: Secondary | ICD-10-CM

## 2020-01-09 DIAGNOSIS — K831 Obstruction of bile duct: Secondary | ICD-10-CM

## 2020-01-09 DIAGNOSIS — O26649 Intrahepatic cholestasis of pregnancy, unspecified trimester: Secondary | ICD-10-CM

## 2020-01-09 DIAGNOSIS — O30043 Twin pregnancy, dichorionic/diamniotic, third trimester: Secondary | ICD-10-CM

## 2020-01-09 DIAGNOSIS — Z1389 Encounter for screening for other disorder: Secondary | ICD-10-CM

## 2020-01-09 DIAGNOSIS — Z3A34 34 weeks gestation of pregnancy: Secondary | ICD-10-CM | POA: Diagnosis not present

## 2020-01-09 DIAGNOSIS — O0990 Supervision of high risk pregnancy, unspecified, unspecified trimester: Secondary | ICD-10-CM

## 2020-01-09 DIAGNOSIS — R040 Epistaxis: Secondary | ICD-10-CM

## 2020-01-09 DIAGNOSIS — O26643 Intrahepatic cholestasis of pregnancy, third trimester: Secondary | ICD-10-CM

## 2020-01-09 DIAGNOSIS — Z331 Pregnant state, incidental: Secondary | ICD-10-CM

## 2020-01-09 LAB — POCT URINALYSIS DIPSTICK OB
Blood, UA: NEGATIVE
Glucose, UA: NEGATIVE
Ketones, UA: NEGATIVE
Leukocytes, UA: NEGATIVE
Nitrite, UA: NEGATIVE
POC,PROTEIN,UA: NEGATIVE

## 2020-01-09 NOTE — Addendum Note (Signed)
Addended by: Wells Guiles R on: 01/09/2020 10:15 AM   Modules accepted: Orders

## 2020-01-09 NOTE — Patient Instructions (Signed)
BROOKLIN RIEGER  01/09/2020   Your procedure is scheduled on:  01/23/2020  Arrive at [place] at Entrance C on [address] at [facility]  and [address]. You are invited to use the FREE valet parking or use the Visitor's parking deck.  Pick up the phone at the desk and dial [phone].  Call this number if you have problems the morning of surgery: [phone]  Remember:   Do not eat food:(After Midnight) Desps de medianoche.  Do not drink clear liquids: (After Midnight) Desps de medianoche.  Take these medicines the morning of surgery with A SIP OF WATER:  Take actigal as prescribed.   Do not wear jewelry, make-up or nail polish.  Do not wear lotions, powders, or perfumes. Do not wear deodorant.  Do not shave 48 hours prior to surgery.  Do not bring valuables to the hospital.  [facility] is not   responsible for any belongings or valuables brought to the hospital.  Contacts, dentures or bridgework may not be worn into surgery.  Leave suitcase in the car. After surgery it may be brought to your room.  For patients admitted to the hospital, checkout time is 11:00 AM the day of              discharge.      Please read over the following fact sheets that you were given:     Preparing for Surgery

## 2020-01-09 NOTE — Progress Notes (Signed)
[place] DI/DI twins BABY A: breech left,female,anterior placenta gr 1,fhr 138 bpm,svp 8.7 cm,BPP 8/8 BABY B: cephalic,right,female,anterior placenta gr 1,fhr 142,svp 7.3 cm,BPP 8/8

## 2020-01-09 NOTE — Patient Instructions (Signed)
Maria Cooper, I greatly value your feedback.  If you receive a survey following your visit with [place] today, we appreciate you taking the time to fill it out.  Thanks, Knute Neu, CNM, WHNP-BC  [facility] ([place],  [id number]) Entrance C, located off of [address] parking   Go to [email or web address] to register for FREE online childbirth classes    Call the office [phone]) or go to [facility] if:  You begin to have strong, frequent contractions  Your water breaks.  Sometimes it is a big gush of fluid, sometimes it is just a trickle that keeps getting your panties wet or running down your legs  You have vaginal bleeding.  It is normal to have a small amount of spotting if your cervix was checked.   You don't feel your baby moving like normal.  If you don't, get you something to eat and drink and lay down and focus on feeling your baby move.  You should feel at least 10 movements in 2 hours.  If you don't, you should call the office or go to [facility].   Call the office [phone]) or go to [facility] hospital for these signs of pre-eclampsia:  Severe headache that does not go away with Tylenol  Visual changes- seeing spots, double, blurred vision  Pain under your right breast or upper abdomen that does not go away with Tums or heartburn medicine  Nausea and/or vomiting  Severe swelling in your hands, feet, and face    Home Blood Pressure Monitoring for Patients   Your provider has recommended that you check your blood pressure (BP) at least once a week at home. If you do not have a blood pressure cuff at home, one will be provided for you. Contact your provider if you have not received your monitor within 1 week.   Helpful Tips for Accurate Home Blood Pressure Checks  . Don't smoke, exercise, or drink caffeine 30 minutes before checking your BP . Use the restroom before checking your BP (a full  bladder can raise your pressure) . Relax in a comfortable upright chair . Feet on the ground . Left arm resting comfortably on a flat surface at the level of your heart . Legs uncrossed . Back supported . Sit quietly and don't talk . Place the cuff on your bare arm . Adjust snuggly, so that only two fingertips can fit between your skin and the top of the cuff . Check 2 readings separated by at least one minute . Keep a log of your BP readings . For a visual, please reference this diagram: http://ccnc.care/bpdiagram  Provider Name: [facility]     Phone: [phone]  Zone 1: ALL CLEAR  Continue to monitor your symptoms:  . BP reading is less than 140 (top number) or less than 90 (bottom number)  . No right upper stomach pain . No headaches or seeing spots . No feeling nauseated or throwing up . No swelling in face and hands  Zone 2: CAUTION Call your doctor's office for any of the following:  . BP reading is greater than 140 (top number) or greater than 90 (bottom number)  . Stomach pain under your ribs in the middle or right side . Headaches or seeing spots . Feeling nauseated or throwing up . Swelling in face and hands  Zone 3: EMERGENCY  Seek immediate medical care if you have any of  the following:  . BP reading is greater than160 (top number) or greater than 110 (bottom number) . Severe headaches not improving with Tylenol . Serious difficulty catching your breath . Any worsening symptoms from Zone 2  Preterm Labor and Birth Information  The normal length of a pregnancy is 39-41 weeks. Preterm labor is when labor starts before 37 completed weeks of pregnancy. What are the risk factors for preterm labor? Preterm labor is more likely to occur in women who:  Have certain infections during pregnancy such as a bladder infection, sexually transmitted infection, or infection inside the uterus (chorioamnionitis).  Have a shorter-than-normal cervix.  Have gone into  preterm labor before.  Have had surgery on their cervix.  Are younger than age 54 or older than age 25.  Are African American.  Are pregnant with twins or multiple babies (multiple gestation).  Take street drugs or smoke while pregnant.  Do not gain enough weight while pregnant.  Became pregnant shortly after having been pregnant. What are the symptoms of preterm labor? Symptoms of preterm labor include:  Cramps similar to those that can happen during a menstrual period. The cramps may happen with diarrhea.  Pain in the abdomen or lower back.  Regular uterine contractions that may feel like tightening of the abdomen.  A feeling of increased pressure in the pelvis.  Increased watery or bloody mucus discharge from the vagina.  Water breaking (ruptured amniotic sac). Why is it important to recognize signs of preterm labor? It is important to recognize signs of preterm labor because babies who are born prematurely may not be fully developed. This can put them at an increased risk for:  Long-term (chronic) heart and lung problems.  Difficulty immediately after birth with regulating body systems, including blood sugar, body temperature, heart rate, and breathing rate.  Bleeding in the brain.  Cerebral palsy.  Learning difficulties.  Death. These risks are highest for babies who are born before 48 weeks of pregnancy. How is preterm labor treated? Treatment depends on the length of your pregnancy, your condition, and the health of your baby. It may involve: 1. Having a stitch (suture) placed in your cervix to prevent your cervix from opening too early (cerclage). 2. Taking or being given medicines, such as: ? Hormone medicines. These may be given early in pregnancy to help support the pregnancy. ? Medicine to stop contractions. ? Medicines to help mature the baby's lungs. These may be prescribed if the risk of delivery is high. ? Medicines to prevent your baby from  developing cerebral palsy. If the labor happens before 34 weeks of pregnancy, you may need to stay in the hospital. What should I do if I think I am in preterm labor? If you think that you are going into preterm labor, call your health care provider right away. How can I prevent preterm labor in future pregnancies? To increase your chance of having a full-term pregnancy:  Do not use any tobacco products, such as cigarettes, chewing tobacco, and e-cigarettes. If you need help quitting, ask your health care provider.  Do not use street drugs or medicines that have not been prescribed to you during your pregnancy.  Talk with your health care provider before taking any herbal supplements, even if you have been taking them regularly.  Make sure you gain a healthy amount of weight during your pregnancy.  Watch for infection. If you think that you might have an infection, get it checked right away.  Make sure to  tell your health care provider if you have gone into preterm labor before. This information is not intended to replace advice given to you by your health care provider. Make sure you discuss any questions you have with your health care provider. Document Revised: 06/21/2018 Document Reviewed: 07/21/2015 Elsevier Patient Education  [place].

## 2020-01-09 NOTE — Progress Notes (Addendum)
HIGH-RISK PREGNANCY VISIT Patient name: Maria Cooper MRN [id number]  Date of birth: 1995-06-05 Chief Complaint:   Routine Prenatal Visit  History of Present Illness:   Maria Cooper is a 24 y.o. G55P1001 female at 42w1dwith an Estimated Date of Delivery: 02/19/20 being seen today for ongoing management of a high-risk pregnancy complicated by intrahepatic cholestasis of pregnancy currently on actigall 3033mTID and multiple gestation di-di twins.  Today she reports contractions, takes procardia when they get bad-usually twice/wk and goes away. Itching stable, occ some on feet. Frequent nosebleeds. [facility] 11/28/2019 08/07/2019 10/05/2017 07/02/2017 03/30/2017  Decreased Interest 0 0 0 0 0  Down, Depressed, Hopeless 0 0 0 0 0  PHQ - 2 Score 0 0 0 0 0  Altered sleeping 0 0 - 0 -  Tired, decreased energy 0 0 - 0 -  Change in appetite 0 0 - 0 -  Feeling bad or failure about yourself  0 0 - 0 -  Trouble concentrating 0 0 - 0 -  Moving slowly or fidgety/restless 0 0 - 0 -  Suicidal thoughts 0 0 - 0 -  PHQ-9 Score 0 0 - 0 -  Difficult doing work/chores - Not difficult at all - - -    Contractions: Irregular. Vag. Bleeding: None.  Movement: Present. denies leaking of fluid.  Review of Systems:   Pertinent items are noted in HPI Denies abnormal vaginal discharge w/ itching/odor/irritation, headaches, visual changes, shortness of breath, chest pain, abdominal pain, severe nausea/vomiting, or problems with urination or bowel movements unless otherwise stated above. Pertinent History Reviewed:  Reviewed past medical,surgical, social, obstetrical and family history.  Reviewed problem list, medications and allergies. Physical Assessment:   Vitals:   01/09/20 0925  BP: 127/68  Pulse: 89  Weight: 199 lb (90.3 kg)  Body mass index is 32.12 kg/m.           Physical Examination:   General appearance: alert, well appearing, and in no distress  Mental status: alert, oriented  to person, place, and time  Skin: warm & dry   Extremities: Edema: Trace    Cardiovascular: normal heart rate noted  Respiratory: normal respiratory effort, no distress  Abdomen: gravid, soft, non-tender  Pelvic: Cervical exam deferred         Fetal Status: Fetal Heart Rate (bpm): 138/142   Movement: Present    Fetal Surveillance Testing today: [place]/DI twins BABY A: breech left,female,anterior placenta gr 1,fhr 138 bpm,svp 8.7 cm,BPP 8/8 BABY B: cephalic,right,female,anterior placenta gr 1,fhr 142,svp 7.3 cm,BPP 8/8  Chaperone: N/A    Results for orders placed or performed in visit on 01/09/20 (from the past 24 hour(s))  POC Urinalysis Dipstick OB   Collection Time: 01/09/20  9:31 AM  Result Value Ref Range   Color, UA     Clarity, UA     Glucose, UA Negative Negative   Bilirubin, UA     Ketones, UA neg    Spec Grav, UA     Blood, UA neg    pH, UA     POC,PROTEIN,UA Negative Negative, Trace, Small (1+), Moderate (2+), Large (3+), 4+   Urobilinogen, UA     Nitrite, UA neg    Leukocytes, UA Negative Negative   Appearance     Odor      Assessment & Plan:  1) High-risk pregnancy G2P1001 at 34108w1dth an Estimated Date of Delivery: 02/19/20   2) Di-Di twins, stable, 12% weight discrepancy last  EFW @ 32wks  3) Cholestasis, stable on actigall 336m TID  4) Frequent nosebleeds> check labs today  Meds: No orders of the defined types were placed in this encounter.   Labs/procedures today: cbc, cmp  Treatment Plan:    EFW next week w/ BPP    Has c/s scheduled 11/12  Reviewed: Preterm labor symptoms and general obstetric precautions including but not limited to vaginal bleeding, contractions, leaking of fluid and fetal movement were reviewed in detail with the patient.  All questions were answered. Has home bp cuff.  Check bp weekly, let uKoreaknow if >140/90.   Follow-up: Return in about 1 week (around 01/16/2020) for As scheduled.  Orders Placed This Encounter  Procedures  .  [place] Follow Up  . [place] BPP WO NON STRESS  . [place] Follow Up AddL Gest  . CBC  . Comprehensive metabolic panel  . POC Urinalysis Dipstick OB   KRoma SchanzCNM, WOrange [facility]10/29/2021 10:14 AM

## 2020-01-10 LAB — CBC
Hematocrit: 32.2 % — ABNORMAL LOW (ref 34.0–46.6)
Hemoglobin: 11.1 g/dL (ref 11.1–15.9)
MCH: 31 pg (ref 26.6–33.0)
MCHC: 34.5 g/dL (ref 31.5–35.7)
MCV: 90 fL (ref 79–97)
Platelets: 136 10*3/uL — ABNORMAL LOW (ref 150–450)
RBC: 3.58 x10E6/uL — ABNORMAL LOW (ref 3.77–5.28)
RDW: 15.2 % (ref 11.7–15.4)
WBC: 9.9 10*3/uL (ref 3.4–10.8)

## 2020-01-10 LAB — COMPREHENSIVE METABOLIC PANEL
ALT: 10 IU/L (ref 0–32)
AST: 16 IU/L (ref 0–40)
Albumin/Globulin Ratio: 1.6 (ref 1.2–2.2)
Albumin: 3.6 g/dL — ABNORMAL LOW (ref 3.9–5.0)
Alkaline Phosphatase: 159 IU/L — ABNORMAL HIGH (ref 44–121)
BUN/Creatinine Ratio: 8 — ABNORMAL LOW (ref 9–23)
BUN: 4 mg/dL — ABNORMAL LOW (ref 6–20)
Bilirubin Total: 0.3 mg/dL (ref 0.0–1.2)
CO2: 20 mmol/L (ref 20–29)
Calcium: 9.4 mg/dL (ref 8.7–10.2)
Chloride: 105 mmol/L (ref 96–106)
Creatinine, Ser: 0.5 mg/dL — ABNORMAL LOW (ref 0.57–1.00)
GFR calc Af Amer: 156 mL/min/{1.73_m2} (ref >59)
GFR calc non Af Amer: 135 mL/min/{1.73_m2} (ref >59)
Globulin, Total: 2.2 g/dL (ref 1.5–4.5)
Glucose: 79 mg/dL (ref 65–99)
Potassium: 4.1 mmol/L (ref 3.5–5.2)
Sodium: 140 mmol/L (ref 134–144)
Total Protein: 5.8 g/dL — ABNORMAL LOW (ref 6.0–8.5)

## 2020-01-15 ENCOUNTER — Other Ambulatory Visit: Payer: Self-pay | Admitting: Women's Health

## 2020-01-15 DIAGNOSIS — O0993 Supervision of high risk pregnancy, unspecified, third trimester: Secondary | ICD-10-CM

## 2020-01-15 DIAGNOSIS — O26619 Liver and biliary tract disorders in pregnancy, unspecified trimester: Secondary | ICD-10-CM

## 2020-01-15 DIAGNOSIS — K831 Obstruction of bile duct: Secondary | ICD-10-CM

## 2020-01-15 DIAGNOSIS — O26649 Intrahepatic cholestasis of pregnancy, unspecified trimester: Secondary | ICD-10-CM

## 2020-01-15 DIAGNOSIS — O30043 Twin pregnancy, dichorionic/diamniotic, third trimester: Secondary | ICD-10-CM

## 2020-01-16 ENCOUNTER — Encounter: Payer: Self-pay | Admitting: Obstetrics & Gynecology

## 2020-01-16 ENCOUNTER — Ambulatory Visit (INDEPENDENT_AMBULATORY_CARE_PROVIDER_SITE_OTHER): Payer: No Typology Code available for payment source | Admitting: Obstetrics & Gynecology

## 2020-01-16 ENCOUNTER — Other Ambulatory Visit: Payer: Self-pay

## 2020-01-16 ENCOUNTER — Ambulatory Visit (INDEPENDENT_AMBULATORY_CARE_PROVIDER_SITE_OTHER): Payer: No Typology Code available for payment source

## 2020-01-16 VITALS — BP 113/68 | HR 89 | Wt 202.5 lb

## 2020-01-16 DIAGNOSIS — O26613 Liver and biliary tract disorders in pregnancy, third trimester: Secondary | ICD-10-CM | POA: Diagnosis not present

## 2020-01-16 DIAGNOSIS — O26619 Liver and biliary tract disorders in pregnancy, unspecified trimester: Secondary | ICD-10-CM

## 2020-01-16 DIAGNOSIS — Z3A35 35 weeks gestation of pregnancy: Secondary | ICD-10-CM

## 2020-01-16 DIAGNOSIS — K831 Obstruction of bile duct: Secondary | ICD-10-CM | POA: Diagnosis not present

## 2020-01-16 DIAGNOSIS — O0990 Supervision of high risk pregnancy, unspecified, unspecified trimester: Secondary | ICD-10-CM

## 2020-01-16 DIAGNOSIS — O0993 Supervision of high risk pregnancy, unspecified, third trimester: Secondary | ICD-10-CM

## 2020-01-16 DIAGNOSIS — Z1389 Encounter for screening for other disorder: Secondary | ICD-10-CM

## 2020-01-16 DIAGNOSIS — O30043 Twin pregnancy, dichorionic/diamniotic, third trimester: Secondary | ICD-10-CM

## 2020-01-16 DIAGNOSIS — Z331 Pregnant state, incidental: Secondary | ICD-10-CM

## 2020-01-16 LAB — POCT URINALYSIS DIPSTICK OB
Blood, UA: NEGATIVE
Glucose, UA: NEGATIVE
Ketones, UA: NEGATIVE
Leukocytes, UA: NEGATIVE
Nitrite, UA: NEGATIVE
POC,PROTEIN,UA: NEGATIVE

## 2020-01-16 NOTE — Progress Notes (Signed)
[place] DI/DI TWINS 35+1 wks,normal ovaries BABY A:female,cephalic,left,anterior placenta gr 1,SVP of fluid 7.3 cm,fhr 138 BPM,BPP 8/8,EFW 2552 g 42%,discordance 4.5% BABY B:female,cephalic right,anterior placenta gr 1,SVP of fluid 6.3 cm,FHR 157 BPM,BPP 8/8,EFW 2672 g 55%

## 2020-01-16 NOTE — Progress Notes (Signed)
   HIGH-RISK PREGNANCY VISIT Patient name: Maria Cooper MRN [id number]  Date of birth: [birth year] Chief Complaint:   High Risk Gestation ([place] today; + cramping)  History of Present Illness:   Maria Cooper is a 24 y.o. G57P1001 female at 64w1dwith an Estimated Date of Delivery: 02/19/20 being seen today for ongoing management of a high-risk pregnancy complicated by cholestasis of pregnancy and di-ditwins.  Today she reports no complaints.  [facility] 11/28/2019 08/07/2019 10/05/2017 07/02/2017 03/30/2017  Decreased Interest 0 0 0 0 0  Down, Depressed, Hopeless 0 0 0 0 0  PHQ - 2 Score 0 0 0 0 0  Altered sleeping 0 0 - 0 -  Tired, decreased energy 0 0 - 0 -  Change in appetite 0 0 - 0 -  Feeling bad or failure about yourself  0 0 - 0 -  Trouble concentrating 0 0 - 0 -  Moving slowly or fidgety/restless 0 0 - 0 -  Suicidal thoughts 0 0 - 0 -  PHQ-9 Score 0 0 - 0 -  Difficult doing work/chores - Not difficult at all - - -    Contractions: Irregular. Vag. Bleeding: None.  Movement: Present. denies leaking of fluid.  Review of Systems:   Pertinent items are noted in HPI Denies abnormal vaginal discharge w/ itching/odor/irritation, headaches, visual changes, shortness of breath, chest pain, abdominal pain, severe nausea/vomiting, or problems with urination or bowel movements unless otherwise stated above. Pertinent History Reviewed:  Reviewed past medical,surgical, social, obstetrical and family history.  Reviewed problem list, medications and allergies. Physical Assessment:   Vitals:   01/16/20 0937  BP: 113/68  Pulse: 89  Weight: 202 lb 8 oz (91.9 kg)  Body mass index is 32.68 kg/m.           Physical Examination:   General appearance: alert, well appearing, and in no distress  Mental status: alert, oriented to person, place, and time  Skin: warm & dry   Extremities: Edema: Trace    Cardiovascular: normal heart rate noted  Respiratory: normal respiratory  effort, no distress  Abdomen: gravid, soft, non-tender  Pelvic: Cervical exam deferred         Fetal Status: Fetal Heart Rate (bpm): 138/157   Movement: Present    Fetal Surveillance Testing today: Biophysical profile 8 out of 8 for both twins  Chaperone:     No results found for this or any previous visit (from the past 24 hour(s)).  Assessment & Plan:  1) High-risk pregnancy G2P1001 at 337w1dith an Estimated Date of Delivery: 02/19/20   2) cholestasis of pregnancy controlled on Actigall 300 mg 3 times a day,   3) di-ditwins,   Meds: No orders of the defined types were placed in this encounter.   Labs/procedures today: Biophysical profiles reassuring for both twins  Treatment Plan: Deliver at 36 weeks with cholestasis of di-ditwins  Reviewed: Term labor symptoms and general obstetric precautions including but not limited to vaginal bleeding, contractions, leaking of fluid and fetal movement were reviewed in detail with the patient.  All questions were answered. Has home bp cuff. Rx faxed to  Check bp weekly, let usKoreanow if >140/90.   Follow-up: Return in about 2 weeks (around 01/30/2020) for monday/tuesday for betamethasone only, Post Op, with Dr EuElonda Husky Orders Placed This Encounter  Procedures  . POC Urinalysis Dipstick OB   LuFlorian Buff1/04/2020 8:36 PM

## 2020-01-19 ENCOUNTER — Ambulatory Visit (INDEPENDENT_AMBULATORY_CARE_PROVIDER_SITE_OTHER): Payer: No Typology Code available for payment source | Admitting: *Deleted

## 2020-01-19 VITALS — BP 107/69 | HR 91

## 2020-01-19 DIAGNOSIS — O30043 Twin pregnancy, dichorionic/diamniotic, third trimester: Secondary | ICD-10-CM

## 2020-01-19 DIAGNOSIS — Z3A35 35 weeks gestation of pregnancy: Secondary | ICD-10-CM

## 2020-01-19 MED ORDER — BETAMETHASONE SOD PHOS & ACET 6 (3-3) MG/ML IJ SUSP
12.0000 mg | Freq: Once | INTRAMUSCULAR | Status: AC
Start: 2020-01-19 — End: 2020-01-19
  Administered 2020-01-19: 12 mg via INTRAMUSCULAR

## 2020-01-19 NOTE — Progress Notes (Addendum)
   NURSE VISIT- INJECTION  SUBJECTIVE:  Maria Cooper is a 24 y.o. G32P1001 female here for a Betamethasone for per provider order. She is 73w4dpregnant.   OBJECTIVE:  BP 107/69   Pulse 91   LMP 05/15/2019   Appears well, in no apparent distress  Injection administered in: Right upper quad. gluteus  Meds ordered this encounter  Medications  . betamethasone acetate-betamethasone sodium phosphate (CELESTONE) injection 12 mg    ASSESSMENT: Pregnancy 334w4detamethasone for per provider order PLAN: Follow-up: as scheduled   [name]11/10/2019 2:58 PM   Attestation of Attending Supervision of Nursing Visit Encounter: Evaluation and management procedures were performed by the nursing staff under my supervision and collaboration.  I have reviewed the nurse's note and chart, and I agree with the management and plan.  LuJacelyn GripD Attending Physician for the Center [facility] 01/20/2020 9:39 AM   Attestation of Attending Supervision of Nursing Visit Encounter: Evaluation and management procedures were performed by the nursing staff under my supervision and collaboration.  I have reviewed the nurse's note and chart, and I agree with the management and plan.  LuJacelyn GripD Attending Physician for the [facility] for [facility] 01/20/2020 4:35 PM

## 2020-01-20 ENCOUNTER — Ambulatory Visit: Payer: No Typology Code available for payment source

## 2020-01-20 ENCOUNTER — Other Ambulatory Visit: Payer: Self-pay

## 2020-01-20 ENCOUNTER — Ambulatory Visit (INDEPENDENT_AMBULATORY_CARE_PROVIDER_SITE_OTHER): Payer: No Typology Code available for payment source | Admitting: *Deleted

## 2020-01-20 DIAGNOSIS — O30043 Twin pregnancy, dichorionic/diamniotic, third trimester: Secondary | ICD-10-CM

## 2020-01-20 DIAGNOSIS — Z3A35 35 weeks gestation of pregnancy: Secondary | ICD-10-CM

## 2020-01-20 MED ORDER — BETAMETHASONE SOD PHOS & ACET 6 (3-3) MG/ML IJ SUSP
12.0000 mg | Freq: Once | INTRAMUSCULAR | Status: AC
  Administered 2020-01-20: 12 mg via INTRAMUSCULAR

## 2020-01-20 NOTE — Progress Notes (Signed)
   NURSE VISIT- INJECTION  SUBJECTIVE:  Maria Cooper is a 24 y.o. G29P1001 female here for a Betamethasone for per provider order. She is 8w5dpregnant.   OBJECTIVE:  LMP 05/15/2019   Appears well, in no apparent distress  Injection administered in: Right upper quad. gluteus  Meds ordered this encounter  Medications  . betamethasone acetate-betamethasone sodium phosphate (CELESTONE) injection 12 mg    ASSESSMENT: Pregnancy 340w5detamethasone for contraception/period management PLAN: Follow-up: as scheduled   [name]11/11/2019 3:24 PM

## 2020-01-20 NOTE — Addendum Note (Signed)
Addended by: Florian Buff on: 01/20/2020 09:43 AM   Modules accepted: Orders, SmartSet

## 2020-01-21 ENCOUNTER — Encounter (HOSPITAL_COMMUNITY)
Admission: RE | Admit: 2020-01-21 | Discharge: 2020-01-21 | Disposition: A | Discharge status: Home / Self Care | Payer: No Typology Code available for payment source | Source: Ambulatory Visit | Attending: Obstetrics & Gynecology | Admitting: Obstetrics & Gynecology

## 2020-01-21 ENCOUNTER — Other Ambulatory Visit (HOSPITAL_COMMUNITY): Payer: No Typology Code available for payment source

## 2020-01-21 DIAGNOSIS — Z01812 Encounter for preprocedural laboratory examination: Secondary | ICD-10-CM | POA: Insufficient documentation

## 2020-01-21 LAB — CBC
HCT: 31.9 % — ABNORMAL LOW (ref 36.0–46.0)
Hemoglobin: 10.9 g/dL — ABNORMAL LOW (ref 12.0–15.0)
MCH: 31.2 pg (ref 26.0–34.0)
MCHC: 34.2 g/dL (ref 30.0–36.0)
MCV: 91.4 fL (ref 80.0–100.0)
Platelets: 145 10*3/uL — ABNORMAL LOW (ref 150–400)
RBC: 3.49 MIL/uL — ABNORMAL LOW (ref 3.87–5.11)
RDW: 16.4 % — ABNORMAL HIGH (ref 11.5–15.5)
WBC: 13.6 10*3/uL — ABNORMAL HIGH (ref 4.0–10.5)
nRBC: 0.1 % (ref 0.0–0.2)

## 2020-01-21 LAB — COMPREHENSIVE METABOLIC PANEL
ALT: 11 U/L (ref 0–44)
AST: 24 U/L (ref 15–41)
Albumin: 2.7 g/dL — ABNORMAL LOW (ref 3.5–5.0)
Alkaline Phosphatase: 125 U/L (ref 38–126)
Anion gap: 11 (ref 5–15)
BUN: <5 mg/dL — ABNORMAL LOW (ref 6–20)
CO2: 19 mmol/L — ABNORMAL LOW (ref 22–32)
Calcium: 8.8 mg/dL — ABNORMAL LOW (ref 8.9–10.3)
Chloride: 106 mmol/L (ref 98–111)
Creatinine, Ser: 0.48 mg/dL (ref 0.44–1.00)
GFR, Estimated: >60 mL/min (ref >60)
Glucose, Bld: 102 mg/dL — ABNORMAL HIGH (ref 70–99)
Potassium: 3.4 mmol/L — ABNORMAL LOW (ref 3.5–5.1)
Sodium: 136 mmol/L (ref 135–145)
Total Bilirubin: 0.5 mg/dL (ref 0.3–1.2)
Total Protein: 5.9 g/dL — ABNORMAL LOW (ref 6.5–8.1)

## 2020-01-21 LAB — RAPID HIV SCREEN (HIV 1/2 AB+AG)
HIV 1/2 Antibodies: NONREACTIVE
HIV-1 P24 Antigen - HIV24: NONREACTIVE

## 2020-01-21 LAB — TYPE AND SCREEN
ABO/RH(D): O POS
Antibody Screen: NEGATIVE

## 2020-01-21 LAB — RPR: RPR Ser Ql: NONREACTIVE

## 2020-01-23 ENCOUNTER — Inpatient Hospital Stay (HOSPITAL_COMMUNITY)
Admission: RE | Admit: 2020-01-23 | Discharge: 2020-01-26 | DRG: 786 | Disposition: A | Discharge status: Home / Self Care | Payer: No Typology Code available for payment source | Attending: Obstetrics & Gynecology | Admitting: Obstetrics & Gynecology

## 2020-01-23 ENCOUNTER — Inpatient Hospital Stay (HOSPITAL_COMMUNITY): Payer: No Typology Code available for payment source | Admitting: Anesthesiology

## 2020-01-23 ENCOUNTER — Encounter (HOSPITAL_COMMUNITY)
Admission: RE | Disposition: A | Discharge status: Home / Self Care | Payer: Self-pay | Source: Home / Self Care | Attending: Obstetrics & Gynecology

## 2020-01-23 ENCOUNTER — Other Ambulatory Visit: Payer: Self-pay

## 2020-01-23 ENCOUNTER — Encounter: Payer: No Typology Code available for payment source | Admitting: Obstetrics & Gynecology

## 2020-01-23 ENCOUNTER — Other Ambulatory Visit: Payer: No Typology Code available for payment source

## 2020-01-23 ENCOUNTER — Encounter (HOSPITAL_COMMUNITY): Payer: Self-pay | Admitting: Obstetrics & Gynecology

## 2020-01-23 DIAGNOSIS — Z3A36 36 weeks gestation of pregnancy: Secondary | ICD-10-CM

## 2020-01-23 DIAGNOSIS — O34211 Maternal care for low transverse scar from previous cesarean delivery: Secondary | ICD-10-CM | POA: Diagnosis present

## 2020-01-23 DIAGNOSIS — O26613 Liver and biliary tract disorders in pregnancy, third trimester: Secondary | ICD-10-CM

## 2020-01-23 DIAGNOSIS — O2662 Liver and biliary tract disorders in childbirth: Secondary | ICD-10-CM | POA: Diagnosis present

## 2020-01-23 DIAGNOSIS — K831 Obstruction of bile duct: Secondary | ICD-10-CM

## 2020-01-23 DIAGNOSIS — O30043 Twin pregnancy, dichorionic/diamniotic, third trimester: Secondary | ICD-10-CM

## 2020-01-23 DIAGNOSIS — Z98891 History of uterine scar from previous surgery: Secondary | ICD-10-CM

## 2020-01-23 LAB — URINALYSIS, ROUTINE W REFLEX MICROSCOPIC
Bacteria, UA: NONE SEEN
Bilirubin Urine: NEGATIVE
Glucose, UA: NEGATIVE mg/dL
Hgb urine dipstick: NEGATIVE
Ketones, ur: NEGATIVE mg/dL
Nitrite: NEGATIVE
Protein, ur: NEGATIVE mg/dL
Specific Gravity, Urine: 1.016 (ref 1.005–1.030)
pH: 6 (ref 5.0–8.0)

## 2020-01-23 SURGERY — Surgical Case
Anesthesia: Spinal | Laterality: Bilateral

## 2020-01-23 MED ORDER — ONDANSETRON HCL 4 MG/2ML IJ SOLN: 4.0000 mg | Freq: Once | INTRAMUSCULAR | Status: DC | PRN

## 2020-01-23 MED ORDER — NALOXONE HCL 4 MG/10ML IJ SOLN
1.0000 ug/kg/h | INTRAVENOUS | Status: DC | PRN
  Filled 2020-01-23: qty 5

## 2020-01-23 MED ORDER — IBUPROFEN 800 MG PO TABS
800.0000 mg | ORAL_TABLET | Freq: Four times a day (QID) | ORAL | Status: DC
  Administered 2020-01-24 – 2020-01-26 (×9): 800 mg via ORAL
  Filled 2020-01-23 (×9): qty 1

## 2020-01-23 MED ORDER — NALBUPHINE HCL 10 MG/ML IJ SOLN: 5.0000 mg | Freq: Once | INTRAMUSCULAR | Status: DC | PRN

## 2020-01-23 MED ORDER — OXYTOCIN-SODIUM CHLORIDE 30-0.9 UT/500ML-% IV SOLN
INTRAVENOUS | Status: DC | PRN
  Administered 2020-01-23: 400 mL via INTRAVENOUS

## 2020-01-23 MED ORDER — GABAPENTIN 100 MG PO CAPS
300.0000 mg | ORAL_CAPSULE | Freq: Three times a day (TID) | ORAL | Status: DC
  Administered 2020-01-23 – 2020-01-24 (×3): 300 mg via ORAL
  Filled 2020-01-23 (×4): qty 3

## 2020-01-23 MED ORDER — KETOROLAC TROMETHAMINE 30 MG/ML IJ SOLN
INTRAMUSCULAR | Status: AC
  Filled 2020-01-23: qty 1

## 2020-01-23 MED ORDER — CEFAZOLIN SODIUM-DEXTROSE 2-4 GM/100ML-% IV SOLN: 2.0000 g | INTRAVENOUS | Status: DC

## 2020-01-23 MED ORDER — DIPHENHYDRAMINE HCL 25 MG PO CAPS: 25.0000 mg | ORAL_CAPSULE | Freq: Four times a day (QID) | ORAL | Status: DC | PRN

## 2020-01-23 MED ORDER — NALOXONE HCL 0.4 MG/ML IJ SOLN: 0.4000 mg | INTRAMUSCULAR | Status: DC | PRN

## 2020-01-23 MED ORDER — ONDANSETRON HCL 4 MG/2ML IJ SOLN: 4.0000 mg | Freq: Three times a day (TID) | INTRAMUSCULAR | Status: DC | PRN

## 2020-01-23 MED ORDER — OXYTOCIN-SODIUM CHLORIDE 30-0.9 UT/500ML-% IV SOLN: 2.5000 [IU]/h | INTRAVENOUS | Status: AC

## 2020-01-23 MED ORDER — FENTANYL CITRATE (PF) 100 MCG/2ML IJ SOLN
INTRAMUSCULAR | Status: AC
  Filled 2020-01-23: qty 2

## 2020-01-23 MED ORDER — PHENYLEPHRINE HCL-NACL 20-0.9 MG/250ML-% IV SOLN
INTRAVENOUS | Status: AC
  Filled 2020-01-23: qty 250

## 2020-01-23 MED ORDER — DIBUCAINE (PERIANAL) 1 % EX OINT: 1.0000 "application " | TOPICAL_OINTMENT | CUTANEOUS | Status: DC | PRN

## 2020-01-23 MED ORDER — DIPHENHYDRAMINE HCL 50 MG/ML IJ SOLN: 12.5000 mg | INTRAMUSCULAR | Status: DC | PRN

## 2020-01-23 MED ORDER — SCOPOLAMINE 1 MG/3DAYS TD PT72: 1.0000 | MEDICATED_PATCH | Freq: Once | TRANSDERMAL | Status: DC

## 2020-01-23 MED ORDER — METOCLOPRAMIDE HCL 5 MG/ML IJ SOLN
INTRAMUSCULAR | Status: DC | PRN
  Administered 2020-01-23 (×2): 5 mg via INTRAVENOUS

## 2020-01-23 MED ORDER — SODIUM CHLORIDE 0.9% FLUSH: 3.0000 mL | INTRAVENOUS | Status: DC | PRN

## 2020-01-23 MED ORDER — PHENYLEPHRINE HCL-NACL 20-0.9 MG/250ML-% IV SOLN
INTRAVENOUS | Status: DC | PRN
  Administered 2020-01-23: 60 ug/min via INTRAVENOUS

## 2020-01-23 MED ORDER — TRANEXAMIC ACID-NACL 1000-0.7 MG/100ML-% IV SOLN
INTRAVENOUS | Status: DC | PRN
  Administered 2020-01-23: 1000 mg via INTRAVENOUS

## 2020-01-23 MED ORDER — METHYLERGONOVINE MALEATE 0.2 MG/ML IJ SOLN: 0.2000 mg | INTRAMUSCULAR | Status: DC | PRN

## 2020-01-23 MED ORDER — ZOLPIDEM TARTRATE 5 MG PO TABS: 5.0000 mg | ORAL_TABLET | Freq: Every evening | ORAL | Status: DC | PRN

## 2020-01-23 MED ORDER — OXYCODONE HCL 5 MG PO TABS
5.0000 mg | ORAL_TABLET | ORAL | Status: DC | PRN
  Administered 2020-01-24 (×2): 10 mg via ORAL
  Administered 2020-01-24 – 2020-01-25 (×4): 5 mg via ORAL
  Administered 2020-01-25 (×2): 10 mg via ORAL
  Administered 2020-01-25 – 2020-01-26 (×4): 5 mg via ORAL
  Filled 2020-01-23 (×3): qty 1
  Filled 2020-01-23: qty 2
  Filled 2020-01-23: qty 1
  Filled 2020-01-23 (×2): qty 2
  Filled 2020-01-23 (×2): qty 1
  Filled 2020-01-23: qty 2
  Filled 2020-01-23 (×2): qty 1

## 2020-01-23 MED ORDER — MENTHOL 3 MG MT LOZG: 1.0000 | LOZENGE | OROMUCOSAL | Status: DC | PRN

## 2020-01-23 MED ORDER — SIMETHICONE 80 MG PO CHEW
80.0000 mg | CHEWABLE_TABLET | ORAL | Status: DC
  Administered 2020-01-23 – 2020-01-25 (×3): 80 mg via ORAL
  Filled 2020-01-23 (×3): qty 1

## 2020-01-23 MED ORDER — LACTATED RINGERS IV SOLN: INTRAVENOUS | Status: DC

## 2020-01-23 MED ORDER — ONDANSETRON HCL 4 MG/2ML IJ SOLN
INTRAMUSCULAR | Status: DC | PRN
  Administered 2020-01-23: 4 mg via INTRAVENOUS

## 2020-01-23 MED ORDER — PHENYLEPHRINE 40 MCG/ML (10ML) SYRINGE FOR IV PUSH (FOR BLOOD PRESSURE SUPPORT)
PREFILLED_SYRINGE | INTRAVENOUS | Status: AC
  Filled 2020-01-23: qty 10

## 2020-01-23 MED ORDER — HYDROMORPHONE HCL 1 MG/ML IJ SOLN: 0.2500 mg | INTRAMUSCULAR | Status: DC | PRN

## 2020-01-23 MED ORDER — METOCLOPRAMIDE HCL 5 MG/ML IJ SOLN
INTRAMUSCULAR | Status: AC
  Filled 2020-01-23: qty 2

## 2020-01-23 MED ORDER — MEPERIDINE HCL 25 MG/ML IJ SOLN: 6.2500 mg | INTRAMUSCULAR | Status: DC | PRN

## 2020-01-23 MED ORDER — FENTANYL CITRATE (PF) 100 MCG/2ML IJ SOLN
INTRAMUSCULAR | Status: DC | PRN
  Administered 2020-01-23: 15 ug via INTRATHECAL

## 2020-01-23 MED ORDER — COCONUT OIL OIL
1.0000 "application " | TOPICAL_OIL | Status: DC | PRN
  Administered 2020-01-23: 1 via TOPICAL

## 2020-01-23 MED ORDER — PRENATAL MULTIVITAMIN CH
1.0000 | ORAL_TABLET | Freq: Every day | ORAL | Status: DC
  Administered 2020-01-24 – 2020-01-26 (×3): 1 via ORAL
  Filled 2020-01-23 (×3): qty 1

## 2020-01-23 MED ORDER — OXYTOCIN-SODIUM CHLORIDE 30-0.9 UT/500ML-% IV SOLN
INTRAVENOUS | Status: AC
  Filled 2020-01-23: qty 500

## 2020-01-23 MED ORDER — CEFAZOLIN SODIUM-DEXTROSE 2-4 GM/100ML-% IV SOLN
INTRAVENOUS | Status: AC
  Filled 2020-01-23: qty 100

## 2020-01-23 MED ORDER — KETOROLAC TROMETHAMINE 30 MG/ML IJ SOLN
30.0000 mg | Freq: Four times a day (QID) | INTRAMUSCULAR | Status: AC
  Administered 2020-01-23 – 2020-01-24 (×3): 30 mg via INTRAVENOUS
  Filled 2020-01-23 (×3): qty 1

## 2020-01-23 MED ORDER — DIPHENHYDRAMINE HCL 25 MG PO CAPS: 25.0000 mg | ORAL_CAPSULE | ORAL | Status: DC | PRN

## 2020-01-23 MED ORDER — ENOXAPARIN SODIUM 40 MG/0.4ML ~~LOC~~ SOLN
40.0000 mg | SUBCUTANEOUS | Status: DC
  Filled 2020-01-23 (×2): qty 0.4

## 2020-01-23 MED ORDER — POVIDONE-IODINE 10 % EX SWAB
2.0000 "application " | Freq: Once | CUTANEOUS | Status: AC
  Administered 2020-01-23: 2 via TOPICAL

## 2020-01-23 MED ORDER — BUPIVACAINE IN DEXTROSE 0.75-8.25 % IT SOLN
INTRATHECAL | Status: DC | PRN
  Administered 2020-01-23: 1.6 mL via INTRATHECAL

## 2020-01-23 MED ORDER — TETANUS-DIPHTH-ACELL PERTUSSIS 5-2.5-18.5 LF-MCG/0.5 IM SUSY: 0.5000 mL | PREFILLED_SYRINGE | Freq: Once | INTRAMUSCULAR | Status: DC

## 2020-01-23 MED ORDER — DEXAMETHASONE SODIUM PHOSPHATE 4 MG/ML IJ SOLN
INTRAMUSCULAR | Status: DC | PRN
  Administered 2020-01-23: 4 mg via INTRAVENOUS

## 2020-01-23 MED ORDER — ONDANSETRON HCL 4 MG/2ML IJ SOLN
INTRAMUSCULAR | Status: AC
  Filled 2020-01-23: qty 2

## 2020-01-23 MED ORDER — LACTATED RINGERS IV SOLN: INTRAVENOUS | Status: DC | PRN

## 2020-01-23 MED ORDER — NALBUPHINE HCL 10 MG/ML IJ SOLN: 5.0000 mg | INTRAMUSCULAR | Status: DC | PRN

## 2020-01-23 MED ORDER — WITCH HAZEL-GLYCERIN EX PADS: 1.0000 "application " | MEDICATED_PAD | CUTANEOUS | Status: DC | PRN

## 2020-01-23 MED ORDER — SIMETHICONE 80 MG PO CHEW: 80.0000 mg | CHEWABLE_TABLET | ORAL | Status: DC | PRN

## 2020-01-23 MED ORDER — MORPHINE SULFATE (PF) 0.5 MG/ML IJ SOLN
INTRAMUSCULAR | Status: AC
  Filled 2020-01-23: qty 10

## 2020-01-23 MED ORDER — MORPHINE SULFATE (PF) 0.5 MG/ML IJ SOLN
INTRAMUSCULAR | Status: DC | PRN
  Administered 2020-01-23: 150 ug via INTRATHECAL

## 2020-01-23 MED ORDER — SENNOSIDES-DOCUSATE SODIUM 8.6-50 MG PO TABS
2.0000 | ORAL_TABLET | ORAL | Status: DC
  Administered 2020-01-23 – 2020-01-25 (×3): 2 via ORAL
  Filled 2020-01-23 (×3): qty 2

## 2020-01-23 MED ORDER — TRANEXAMIC ACID-NACL 1000-0.7 MG/100ML-% IV SOLN
INTRAVENOUS | Status: AC
  Filled 2020-01-23: qty 100

## 2020-01-23 MED ORDER — DEXAMETHASONE SODIUM PHOSPHATE 4 MG/ML IJ SOLN
INTRAMUSCULAR | Status: AC
  Filled 2020-01-23: qty 2

## 2020-01-23 MED ORDER — KETOROLAC TROMETHAMINE 30 MG/ML IJ SOLN
30.0000 mg | Freq: Once | INTRAMUSCULAR | Status: AC | PRN
  Administered 2020-01-23: 30 mg via INTRAVENOUS

## 2020-01-23 MED ORDER — SCOPOLAMINE 1 MG/3DAYS TD PT72
MEDICATED_PATCH | TRANSDERMAL | Status: AC
  Filled 2020-01-23: qty 1

## 2020-01-23 MED ORDER — SIMETHICONE 80 MG PO CHEW
80.0000 mg | CHEWABLE_TABLET | Freq: Three times a day (TID) | ORAL | Status: DC
  Administered 2020-01-23 – 2020-01-26 (×7): 80 mg via ORAL
  Filled 2020-01-23 (×7): qty 1

## 2020-01-23 MED ORDER — METHYLERGONOVINE MALEATE 0.2 MG PO TABS: 0.2000 mg | ORAL_TABLET | ORAL | Status: DC | PRN

## 2020-01-23 MED ORDER — CEFAZOLIN SODIUM-DEXTROSE 2-3 GM-%(50ML) IV SOLR
INTRAVENOUS | Status: DC | PRN
  Administered 2020-01-23: 2 g via INTRAVENOUS

## 2020-01-23 SURGICAL SUPPLY — 36 items
CLAMP CORD UMBIL (MISCELLANEOUS) IMPLANT
CLOTH BEACON ORANGE TIMEOUT ST (SAFETY) ×2 IMPLANT
DERMABOND ADVANCED (GAUZE/BANDAGES/DRESSINGS) ×2
DERMABOND ADVANCED .7 DNX12 (GAUZE/BANDAGES/DRESSINGS) ×2 IMPLANT
DRSG OPSITE POSTOP 4X10 (GAUZE/BANDAGES/DRESSINGS) ×2 IMPLANT
ELECT REM PT RETURN 9FT ADLT (ELECTROSURGICAL) ×2
ELECTRODE REM PT RTRN 9FT ADLT (ELECTROSURGICAL) ×1 IMPLANT
EXTRACTOR VACUUM BELL STYLE (SUCTIONS) IMPLANT
GLOVE BIOGEL PI IND STRL 7.0 (GLOVE) ×1 IMPLANT
GLOVE BIOGEL PI IND STRL 8 (GLOVE) ×1 IMPLANT
GLOVE BIOGEL PI INDICATOR 7.0 (GLOVE) ×1
GLOVE BIOGEL PI INDICATOR 8 (GLOVE) ×1
GLOVE ECLIPSE 8.0 STRL XLNG CF (GLOVE) ×2 IMPLANT
GOWN STRL REUS W/TWL LRG LVL3 (GOWN DISPOSABLE) ×4 IMPLANT
KIT ABG SYR 3ML LUER SLIP (SYRINGE) ×2 IMPLANT
NEEDLE HYPO 18GX1.5 BLUNT FILL (NEEDLE) ×2 IMPLANT
NEEDLE HYPO 22GX1.5 SAFETY (NEEDLE) ×2 IMPLANT
NEEDLE HYPO 25X5/8 SAFETYGLIDE (NEEDLE) ×2 IMPLANT
NS IRRIG 1000ML POUR BTL (IV SOLUTION) ×2 IMPLANT
PACK C SECTION WH (CUSTOM PROCEDURE TRAY) ×2 IMPLANT
PAD OB MATERNITY 4.3X12.25 (PERSONAL CARE ITEMS) ×2 IMPLANT
PENCIL SMOKE EVAC W/HOLSTER (ELECTROSURGICAL) ×2 IMPLANT
RTRCTR C-SECT PINK 25CM LRG (MISCELLANEOUS) IMPLANT
SUT CHROMIC 0 CT 1 (SUTURE) ×2 IMPLANT
SUT MNCRL 0 VIOLET CTX 36 (SUTURE) ×2 IMPLANT
SUT MONOCRYL 0 CTX 36 (SUTURE) ×2
SUT PLAIN 2 0 (SUTURE) ×2
SUT PLAIN 2 0 XLH (SUTURE) IMPLANT
SUT PLAIN ABS 2-0 CT1 27XMFL (SUTURE) ×2 IMPLANT
SUT VIC AB 0 CTX 36 (SUTURE) ×1
SUT VIC AB 0 CTX36XBRD ANBCTRL (SUTURE) ×1 IMPLANT
SUT VIC AB 4-0 KS 27 (SUTURE) IMPLANT
SYR 20CC LL (SYRINGE) ×4 IMPLANT
TOWEL OR 17X24 6PK STRL BLUE (TOWEL DISPOSABLE) ×2 IMPLANT
TRAY FOLEY W/BAG SLVR 14FR LF (SET/KITS/TRAYS/PACK) IMPLANT
WATER STERILE IRR 1000ML POUR (IV SOLUTION) ×2 IMPLANT

## 2020-01-23 NOTE — H&P (Signed)
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                                                                                                      COMMENTS GROSS BODY MOVEMENT                 2  TONE                2  RESPIRATIONS                2  AMNIOTIC FLUID                2                                                          SCORE:  8/8 (Note: NST was not performed as part of this antepartum testing) ANATOMICAL SURVEY                                                                            COMMENTS CEREBRAL VENTRICLES yes normal  CHOROID PLEXUS yes normal  CEREBELLUM yes normal              NASAL BONE yes normal      FACIAL PROFILE yes normal  4 CHAMBERED HEART yes normal  OUTFLOW TRACTS yes normal  DIAPHRAGM yes normal  STOMACH yes normal  RENAL REGION yes normal  BLADDER yes normal      3 VESSEL CORD yes normal              GENITALIA yes normal female     SUSPECTED ABNORMALITIES:  no QUALITY OF SCAN: Satisfactory The current discrepancy in  fetal weight between the twins is 4.5% which  is not  clinically relevant. TECHNICIAN COMMENTS: [place] DI/DI TWINS 35+1 wks,normal ovaries BABY A:female,cephalic,left,anterior placenta gr 1,SVP of fluid 7.3 cm,fhr 138 BPM,BPP 8/8,EFW 2552 g 42%,discordance 4.5% BABY B:female,cephalic right,anterior placenta gr 1,SVP of fluid 6.3 cm,FHR 157 BPM,BPP 8/8,EFW 2672 g 55% A copy of this report including all images has been saved and backed up to a second source for retrieval if needed. All measures and details of the anatomical scan, placentation, fluid volume and pelvic anatomy are contained in that report. Amber Heide Guile 01/16/2020 9:37 AM Clinical Impression and recommendations: I have reviewed the sonogram results above, combined with the patient's current clinical course, below are my impressions and any appropriate recommendations for management based on the sonographic findings. 1.  G2P1001 Estimated Date of Delivery: 02/19/20 by serial sonographic evaluations 2.  Fetal sonographic surveillance findings: Kathi Der twins a). Normal fluid volume x 2 b). Normal antepartum fetal assessment with BPP 8/8 x2 c). Normal growth percentile with appropriate interval growth insignificant discordance 3.  Normal general sonographic findings Recommend continued prenatal evaluations and care based on this sonogram and as clinically indicated from the patient's clinical course. Mertie Clause Greco Gastelum 01/16/2020 10:10 AM  US FETAL BPP WO NON STRESS  Result Date: 01/16/2020 TWINS FOLLOW UP SONOGRAM Maria Cooper is in the office for a follow up sonogram of a twin gestation. She is a 24 y.o. year old G23P1001 with Estimated Date of Delivery: 02/19/20 by LMP now at  7w1dweeks gestation. Thus far the pregnancy has been otherwise complicated by DI/DI twins,cholestatis . The twins are dichorionic/diamniotic. TWIN A GESTATION: PRESENTATION: Cephalic left FETAL ACTIVITY:          Heart rate         138          The fetus is active. AMNIOTIC FLUID: The amniotic fluid volume single deepest pocket is 7.3 cm.  PLACENTA LOCALIZATION:  anterior GRADE 1 CERVIX: Limited view ADNEXA: The ovaries are normal. GESTATIONAL AGE AND  BIOMETRICS: Gestational criteria: Estimated Date of Delivery: 02/19/20 by LMP now at 337w1drevious Scans:10          BIPARIETAL DIAMETER           8.72 cm         35+1 weeks HEAD CIRCUMFERENCE           32.97 cm         37+3 weeks ABDOMINAL CIRCUMFERENCE           31.73 cm         35+5 weeks FEMUR LENGTH           6.25 cm         32+2 weeks                                                       AVERAGE EGA(BY THIS SCAN):  35 weeks                                                 ESTIMATED FETAL WEIGHT:       2552  grams, 42 % BIOPHYSICAL PROFILE:  COMMENTS GROSS BODY MOVEMENT                 2  TONE                2  RESPIRATIONS                2  AMNIOTIC FLUID                2                                                          SCORE:  8/8 (Note: NST was not performed as part of this antepartum testing) ANATOMICAL SURVEY                                                                            COMMENTS CEREBRAL VENTRICLES yes normal  CHOROID PLEXUS yes normal  CEREBELLUM yes normal                  NOSE/LIP yes normal  FACIAL PROFILE yes normal  4 CHAMBERED HEART yes normal  OUTFLOW TRACTS yes normal  DIAPHRAGM yes normal  STOMACH yes normal  RENAL REGION yes normal  BLADDER yes normal      3 VESSEL CORD yes normal              GENITALIA yes normal female     SUSPECTED ABNORMALITIES:  no QUALITY OF SCAN: Satisfactory TWIN B GESTATION: PRESENTATION: Cephalic right FETAL ACTIVITY:          Heart rate         157          The fetus is active. AMNIOTIC FLUID: The amniotic fluid volume single deepest pocket is 6.3 cm. PLACENTA LOCALIZATION:  anterior GRADE 1 CERVIX: Limited view ADNEXA: The ovaries are normal. GESTATIONAL AGE AND  BIOMETRICS: Gestational criteria: Estimated Date of Delivery: 02/19/20 by LMP  now at 93w1dPrevious Scans:10          BIPARIETAL DIAMETER           8.6 cm         34+4 weeks HEAD CIRCUMFERENCE           32.26 cm         36+3 weeks ABDOMINAL CIRCUMFERENCE           32.28 cm         36+1 weeks FEMUR LENGTH           6.53 cm         33+4 weeks                                                       AVERAGE EGA(BY THIS SCAN):  35+1 weeks  ESTIMATED FETAL WEIGHT:       2672  grams, 55 % BIOPHYSICAL PROFILE:                                                                                                      COMMENTS GROSS BODY MOVEMENT                 2  TONE                2  RESPIRATIONS                2  AMNIOTIC FLUID                2                                                          SCORE:  8/8 (Note: NST was not performed as part of this antepartum testing) ANATOMICAL SURVEY                                                                            COMMENTS CEREBRAL VENTRICLES yes normal  CHOROID PLEXUS yes normal  CEREBELLUM yes normal              NASAL BONE yes normal      FACIAL PROFILE yes normal  4 CHAMBERED HEART yes normal  OUTFLOW TRACTS yes normal  DIAPHRAGM yes normal  STOMACH yes normal  RENAL REGION yes normal  BLADDER yes normal      3 VESSEL CORD yes normal              GENITALIA yes normal female     SUSPECTED ABNORMALITIES:  no QUALITY OF SCAN: Satisfactory The current discrepancy in  fetal weight between the twins is 4.5% which  is not clinically relevant. TECHNICIAN COMMENTS: [place] DI/DI TWINS 35+1 wks,normal ovaries BABY A:female,cephalic,left,anterior placenta gr 1,SVP of fluid 7.3 cm,fhr 138 BPM,BPP 8/8,EFW 2552 g 42%,discordance 4.5% BABY B:female,cephalic right,anterior placenta gr 1,SVP of fluid 6.3 cm,FHR 157 BPM,BPP 8/8,EFW 2672 g 55% A copy of this report including all images has been saved and backed up to a second source for retrieval if needed. All measures and details of the anatomical scan, placentation, fluid  volume and pelvic anatomy are contained in that report. Amber Heide Guile 01/16/2020 9:37 AM Clinical Impression and recommendations: I have reviewed the sonogram results above, combined with the patient's current clinical course, below are my impressions and any appropriate recommendations for management based on the sonographic findings. 1.  G2P1001 Estimated Date of Delivery: 02/19/20 by serial sonographic evaluations 2.  Fetal sonographic surveillance findings: Kathi Der twins a). Normal fluid volume x 2 b). Normal antepartum fetal assessment with BPP 8/8 x2 c). Normal growth percentile with appropriate interval growth insignificant discordance 3.  Normal general sonographic findings Recommend continued prenatal evaluations and care based on this sonogram and as clinically indicated from the patient's clinical course. Mertie Clause Greg Cratty 01/16/2020 10:10 AM  US Fetal BPP W/O Non Stress  Result Date: 01/12/2020 TWINS FOLLOW UP SONOGRAM Maria Cooper is in the office for a follow up sonogram of a twin gestation. She is a 24 y.o. year old G28P1001 with Estimated Date of Delivery: 02/19/20 by LMP now at  48w1dweeks gestation. Thus far the pregnancy has been otherwise complicated by cholestasis,DI/DI twins. The twins are dichorionic/diamniotic. TWIN A GESTATION: PRESENTATION: breech complete left FETAL ACTIVITY:          Heart rate         138          The fetus is active. AMNIOTIC FLUID: The amniotic fluid volume single deepest pocket is 8.7 cm. PLACENTA LOCALIZATION:  anterior GRADE 1 CERVIX: Limited view Previous Scans:9 BIOPHYSICAL PROFILE:                                                                                                      COMMENTS GROSS BODY MOVEMENT                 2  TONE                2  RESPIRATIONS                2  AMNIOTIC FLUID                2                                                          SCORE:  8/8 (Note: NST was not performed as part of this antepartum testing) ANATOMICAL SURVEY                                                                             COMMENTS CEREBRAL VENTRICLES yes normal  CHOROID PLEXUS yes normal  CEREBELLUM yes normal  CISTERNA MAGNA yes normal                  FACIAL PROFILE yes normal  4 CHAMBERED HEART yes normal  OUTFLOW TRACTS yes normal  DIAPHRAGM yes normal  STOMACH yes normal  RENAL REGION yes normal  BLADDER yes normal      3 VESSEL CORD  yes normal              GENITALIA   female     SUSPECTED ABNORMALITIES:  no QUALITY OF SCAN: Satisfactory TWIN B GESTATION: PRESENTATION: Cephalic right FETAL ACTIVITY:          Heart rate         142          The fetus is active. AMNIOTIC FLUID: The amniotic fluid volume single deepest pocket is 7.3 cm. PLACENTA LOCALIZATION:  anterior GRADE 1 CERVIX: Limited view Previous Scans:9 BIOPHYSICAL PROFILE:                                                                                                      COMMENTS GROSS BODY MOVEMENT                 2  TONE                2  RESPIRATIONS                2  AMNIOTIC FLUID                2                                                          SCORE:  8/8 (Note: NST was not performed as part of this antepartum testing) ANATOMICAL SURVEY                                                                            COMMENTS CEREBRAL VENTRICLES yes normal  CHOROID PLEXUS yes normal  CEREBELLUM yes normal  CISTERNA MAGNA yes normal                  FACIAL PROFILE yes normal  4 CHAMBERED HEART yes normal  OUTFLOW TRACTS yes normal  DIAPHRAGM yes normal  STOMACH yes normal  RENAL REGION yes normal  BLADDER yes normal      3 VESSEL CORD yes normal              GENITALIA   female     SUSPECTED ABNORMALITIES:  no QUALITY OF SCAN: Satisfactory TECHNICIAN COMMENTS: [place] DI/DI twins BABY A: breech left,female,anterior placenta gr 1,fhr 138 bpm,svp 8.7 cm,BPP 8/8 BABY B: cephalic,right,female,anterior placenta gr 1,fhr 142,svp 7.3 cm,BPP 8/8 A copy of this report including all images has been saved and  backed up to a second source for retrieval if needed. All measures and details of the anatomical scan, placentation, fluid volume and pelvic anatomy are contained in that report. Amber J  Glendell Docker 01/09/2020 9:35 AM Clinical Impression and recommendations: I have reviewed the sonogram results above, combined with the patient's current clinical course, below are my impressions and any appropriate recommendations for management based on the sonographic findings. 1.  G2P1001 Estimated Date of Delivery: 02/19/20 by serial sonographic evaluations 2.  Fetal sonographic surveillance findings: Di/Di twins a). Normal fluid volume for both twins b). Normal antepartum fetal assessment with BPP 8/8  For both twins 3.  Normal general sonographic findings Recommend continued prenatal evaluations and care based on this sonogram and as clinically indicated from the patient's clinical course. Mertie Clause Herchel Hopkin 01/12/2020 11:12 AM  US Fetal BPP W/O Non Stress  Result Date: 01/12/2020 TWINS FOLLOW UP SONOGRAM Maria Cooper is in the office for a follow up sonogram of a twin gestation. She is a 24 y.o. year old G51P1001 with Estimated Date of Delivery: 02/19/20 by LMP now at  11w1dweeks gestation. Thus far the pregnancy has been otherwise complicated by cholestasis,DI/DI twins. The twins are dichorionic/diamniotic. TWIN A GESTATION: PRESENTATION: breech complete left FETAL ACTIVITY:          Heart rate         138          The fetus is active. AMNIOTIC FLUID: The amniotic fluid volume single deepest pocket is 8.7 cm. PLACENTA LOCALIZATION:  anterior GRADE 1 CERVIX: Limited view Previous Scans:9 BIOPHYSICAL PROFILE:                                                                                                      COMMENTS GROSS BODY MOVEMENT                 2  TONE                2  RESPIRATIONS                2  AMNIOTIC FLUID                2                                                          SCORE:  8/8 (Note: NST was not  performed as part of this antepartum testing) ANATOMICAL SURVEY                                                                            COMMENTS CEREBRAL VENTRICLES yes normal  CHOROID PLEXUS yes normal  CEREBELLUM yes normal  CISTERNA MAGNA yes normal  FACIAL PROFILE yes normal  4 CHAMBERED HEART yes normal  OUTFLOW TRACTS yes normal  DIAPHRAGM yes normal  STOMACH yes normal  RENAL REGION yes normal  BLADDER yes normal      3 VESSEL CORD yes normal              GENITALIA   female     SUSPECTED ABNORMALITIES:  no QUALITY OF SCAN: Satisfactory TWIN B GESTATION: PRESENTATION: Cephalic right FETAL ACTIVITY:          Heart rate         142          The fetus is active. AMNIOTIC FLUID: The amniotic fluid volume single deepest pocket is 7.3 cm. PLACENTA LOCALIZATION:  anterior GRADE 1 CERVIX: Limited view Previous Scans:9 BIOPHYSICAL PROFILE:                                                                                                      COMMENTS GROSS BODY MOVEMENT                 2  TONE                2  RESPIRATIONS                2  AMNIOTIC FLUID                2                                                          SCORE:  8/8 (Note: NST was not performed as part of this antepartum testing) ANATOMICAL SURVEY                                                                            COMMENTS CEREBRAL VENTRICLES yes normal  CHOROID PLEXUS yes normal  CEREBELLUM yes normal  CISTERNA MAGNA yes normal                  FACIAL PROFILE yes normal  4 CHAMBERED HEART yes normal  OUTFLOW TRACTS yes normal  DIAPHRAGM yes normal  STOMACH yes normal  RENAL REGION yes normal  BLADDER yes normal      3 VESSEL CORD yes normal              GENITALIA   female     SUSPECTED ABNORMALITIES:  no QUALITY OF SCAN: Satisfactory TECHNICIAN COMMENTS: [place] DI/DI twins BABY A: breech left,female,anterior placenta gr 1,fhr 138 bpm,svp 8.7 cm,BPP 8/8 BABY B: cephalic,right,female,anterior placenta gr 1,fhr 142,svp 7.3 cm,BPP  8/8 A copy of  this report including all images has been saved and backed up to a second source for retrieval if needed. All measures and details of the anatomical scan, placentation, fluid volume and pelvic anatomy are contained in that report. Amber Heide Guile 01/09/2020 9:35 AM Clinical Impression and recommendations: I have reviewed the sonogram results above, combined with the patient's current clinical course, below are my impressions and any appropriate recommendations for management based on the sonographic findings. 1.  G2P1001 Estimated Date of Delivery: 02/19/20 by serial sonographic evaluations 2.  Fetal sonographic surveillance findings: Di/Di twins a). Normal fluid volume for both twins b). Normal antepartum fetal assessment with BPP 8/8  For both twins 3.  Normal general sonographic findings Recommend continued prenatal evaluations and care based on this sonogram and as clinically indicated from the patient's clinical course. Mertie Clause Megumi Treaster 01/12/2020 11:12 AM  US Fetal BPP W/O Non Stress  Result Date: 01/05/2020 TWINS FOLLOW UP SONOGRAM Maria Cooper is in the office for a follow up sonogram of a twin gestation. She is a 24 y.o. year old G70P1001 with Estimated Date of Delivery: 02/19/20 by LMP now at  76w1dweeks gestation. Thus far the pregnancy has been otherwise complicated by DI/DI twins. The twins are dichorionic/diamniotic. TWIN A GESTATION: PRESENTATION: breech frank left FETAL ACTIVITY:          Heart rate         161          The fetus is active. AMNIOTIC FLUID: The amniotic fluid volume single deepest pocket is 5.7 cm. PLACENTA LOCALIZATION:  anterior GRADE 1 CERVIX: Limited view Previous Scans:8 BIOPHYSICAL PROFILE:                                                                                                      COMMENTS GROSS BODY MOVEMENT                 2  TONE                2  RESPIRATIONS                2  AMNIOTIC FLUID                2                                                           SCORE:  8/8 (Note: NST was not performed as part of this antepartum testing) ANATOMICAL SURVEY  COMMENTS CEREBRAL VENTRICLES yes normal  CHOROID PLEXUS yes normal  CEREBELLUM yes normal  CISTERNA MAGNA yes normal      ORBITS yes normal  NASAL BONE yes normal  NOSE/LIP yes normal  FACIAL PROFILE yes normal  4 CHAMBERED HEART yes normal  OUTFLOW TRACTS yes normal  DIAPHRAGM yes normal  STOMACH yes normal  RENAL REGION yes normal  BLADDER yes normal      3 VESSEL CORD yes normal              GENITALIA yes normal female     SUSPECTED ABNORMALITIES:  no QUALITY OF SCAN: Satisfactory TWIN B GESTATION: PRESENTATION: Cephalic right FETAL ACTIVITY:          Heart rate         152          The fetus is active. AMNIOTIC FLUID: The amniotic fluid volume single deepest pocket is 7.2 cm. PLACENTA LOCALIZATION:  anterior GRADE 1 CERVIX: Limited view Previous Scans:8 BIOPHYSICAL PROFILE:                                                                                                      COMMENTS GROSS BODY MOVEMENT                 2  TONE                2  RESPIRATIONS                2  AMNIOTIC FLUID                2                                                          SCORE:  8/8 (Note: NST was not performed as part of this antepartum testing) ANATOMICAL SURVEY                                                                            COMMENTS CEREBRAL VENTRICLES yes normal  CHOROID PLEXUS yes normal  CEREBELLUM yes normal  CISTERNA MAGNA yes normal  NUCHAL REGION yes normal          NOSE/LIP yes normal      4 CHAMBERED HEART yes normal  OUTFLOW TRACTS yes normal  DIAPHRAGM yes normal  STOMACH yes normal  RENAL REGION yes normal  BLADDER yes normal      3 VESSEL CORD yes normal              GENITALIA yes normal female     SUSPECTED ABNORMALITIES:  no QUALITY OF SCAN: Satisfactory TECHNICIAN COMMENTS: [place] DI/DI Twins BABY A:breech left,female,BPP  8/8,anterior placenta gr 1,SVP of fluid 5.7 cm,fhr 161 bpm BABY B:cephalic right,female,BPP 8/8,anterior placenta gr 1,SVP of fluid 7.2 cm,fhr 152 bpm, A copy of this report including all images has been saved and backed up to a second source for retrieval if needed. All measures and details of the anatomical scan, placentation, fluid volume and pelvic anatomy are contained in that report. Amber Heide Guile 01/02/2020 10:31 AM Clinical Impression and recommendations: I have reviewed the sonogram results above, combined with the patient's current clinical course, below are my impressions and any appropriate recommendations for management based on the sonographic findings. 1.  G2P1001 Estimated Date of Delivery: 02/19/20 by serial sonographic evaluations 2.  Fetal sonographic surveillance findings: ICP in twin gestation DiDi twins, twin A is breech a). Normal fluid volume b). Normal antepartum fetal assessment with BPP 8/8 3.  Normal general sonographic findings Recommend continued prenatal evaluations and care based on this sonogram and as clinically indicated from the patient's clinical course. Mertie Clause Argie Applegate 01/05/2020 7:00 AM  US Fetal BPP W/O Non Stress  Result Date: 01/05/2020 TWINS FOLLOW UP SONOGRAM Maria Cooper is in the office for a follow up sonogram of a twin gestation. She is a 24 y.o. year old G55P1001 with Estimated Date of Delivery: 02/19/20 by LMP now at  28w1dweeks gestation. Thus far the pregnancy has been otherwise complicated by DI/DI twins. The twins are dichorionic/diamniotic. TWIN A GESTATION: PRESENTATION: breech frank left FETAL ACTIVITY:          Heart rate         161          The fetus is active. AMNIOTIC FLUID: The amniotic fluid volume single deepest pocket is 5.7 cm. PLACENTA LOCALIZATION:  anterior GRADE 1 CERVIX: Limited view Previous Scans:8 BIOPHYSICAL PROFILE:                                                                                                      COMMENTS GROSS BODY  MOVEMENT                 2  TONE                2  RESPIRATIONS                2  AMNIOTIC FLUID                2                                                          SCORE:  8/8 (Note: NST was not performed as part of this antepartum testing) ANATOMICAL SURVEY  COMMENTS CEREBRAL VENTRICLES yes normal  CHOROID PLEXUS yes normal  CEREBELLUM yes normal  CISTERNA MAGNA yes normal      ORBITS yes normal  NASAL BONE yes normal  NOSE/LIP yes normal  FACIAL PROFILE yes normal  4 CHAMBERED HEART yes normal  OUTFLOW TRACTS yes normal  DIAPHRAGM yes normal  STOMACH yes normal  RENAL REGION yes normal  BLADDER yes normal      3 VESSEL CORD yes normal              GENITALIA yes normal female     SUSPECTED ABNORMALITIES:  no QUALITY OF SCAN: Satisfactory TWIN B GESTATION: PRESENTATION: Cephalic right FETAL ACTIVITY:          Heart rate         152          The fetus is active. AMNIOTIC FLUID: The amniotic fluid volume single deepest pocket is 7.2 cm. PLACENTA LOCALIZATION:  anterior GRADE 1 CERVIX: Limited view Previous Scans:8 BIOPHYSICAL PROFILE:                                                                                                      COMMENTS GROSS BODY MOVEMENT                 2  TONE                2  RESPIRATIONS                2  AMNIOTIC FLUID                2                                                          SCORE:  8/8 (Note: NST was not performed as part of this antepartum testing) ANATOMICAL SURVEY                                                                            COMMENTS CEREBRAL VENTRICLES yes normal  CHOROID PLEXUS yes normal  CEREBELLUM yes normal  CISTERNA MAGNA yes normal  NUCHAL REGION yes normal          NOSE/LIP yes normal      4 CHAMBERED HEART yes normal  OUTFLOW TRACTS yes normal  DIAPHRAGM yes normal  STOMACH yes normal  RENAL REGION yes normal  BLADDER yes normal      3 VESSEL CORD yes normal               GENITALIA yes normal female     SUSPECTED  ABNORMALITIES:  no QUALITY OF SCAN: Satisfactory TECHNICIAN COMMENTS: [place] DI/DI Twins BABY A:breech left,female,BPP 8/8,anterior placenta gr 1,SVP of fluid 5.7 cm,fhr 161 bpm BABY B:cephalic right,female,BPP 8/8,anterior placenta gr 1,SVP of fluid 7.2 cm,fhr 152 bpm, A copy of this report including all images has been saved and backed up to a second source for retrieval if needed. All measures and details of the anatomical scan, placentation, fluid volume and pelvic anatomy are contained in that report. Amber Heide Guile 01/02/2020 10:31 AM Clinical Impression and recommendations: I have reviewed the sonogram results above, combined with the patient's current clinical course, below are my impressions and any appropriate recommendations for management based on the sonographic findings. 1.  G2P1001 Estimated Date of Delivery: 02/19/20 by serial sonographic evaluations 2.  Fetal sonographic surveillance findings: ICP in twin gestation DiDi twins, twin A is breech a). Normal fluid volume b). Normal antepartum fetal assessment with BPP 8/8 3.  Normal general sonographic findings Recommend continued prenatal evaluations and care based on this sonogram and as clinically indicated from the patient's clinical course. Mertie Clause Doug Bucklin 01/05/2020 7:00 AM  US Fetal BPP W/O Non Stress  Result Date: 12/29/2019 TWINS FOLLOW UP SONOGRAM Maria Cooper is in the office for a follow up sonogram of a twin gestation. She is a 24 y.o. year old G53P1001 with Estimated Date of Delivery: 02/19/20 by LMP now at  12w1dweeks gestation. Thus far the pregnancy has been otherwise complicated by DI/DI twins. The twins are dichorionic/diamniotic. TWIN A GESTATION: PRESENTATION: breech complete FETAL ACTIVITY:          Heart rate         123          The fetus is active. AMNIOTIC FLUID: The amniotic fluid volume single deepest pocket is 6.3 cm. PLACENTA LOCALIZATION:  anterior GRADE 1 CERVIX: Limited view  GESTATIONAL AGE AND  BIOMETRICS: Gestational criteria: Estimated Date of Delivery: 02/19/20 by LMP now at 350w1drevious Scans:7          BIPARIETAL DIAMETER           8.09 cm         23+3 weeks HEAD CIRCUMFERENCE           30.56 cm         24 weeks ABDOMINAL CIRCUMFERENCE           27.75 cm         31+5 weeks FEMUR LENGTH           5.64 cm         29+4 weeks   1.4%                                                       AVERAGE EGA(BY THIS SCAN):  32+3 weeks                                                 ESTIMATED FETAL WEIGHT:       1762  grams, 20 % BIOPHYSICAL PROFILE:  COMMENTS GROSS BODY MOVEMENT                 2  TONE                2  RESPIRATIONS                2  AMNIOTIC FLUID                2                                                          SCORE:  8/8 (Note: NST was not performed as part of this antepartum testing) ANATOMICAL SURVEY                                                                            COMMENTS CEREBRAL VENTRICLES yes normal  CHOROID PLEXUS yes normal  CEREBELLUM yes normal  CISTERNA MAGNA yes normal              NOSE/LIP yes normal  FACIAL PROFILE yes normal  4 CHAMBERED HEART yes normal  OUTFLOW TRACTS yes normal  DIAPHRAGM yes normal  STOMACH yes normal  RENAL REGION yes normal  BLADDER yes normal      3 VESSEL CORD yes normal              GENITALIA yes normal female     SUSPECTED ABNORMALITIES:  no QUALITY OF SCAN: Satisfactory TWIN B GESTATION: PRESENTATION: Cephalic right FETAL ACTIVITY:          Heart rate         142          The fetus is active. AMNIOTIC FLUID: The amniotic fluid volume single deepest pocket is 5.1 cm. PLACENTA LOCALIZATION:  anterior GRADE 1 CERVIX: Limited view GESTATIONAL AGE AND  BIOMETRICS: Gestational criteria: Estimated Date of Delivery: 02/19/20 by LMP now at 42w1dPrevious Scans:7          BIPARIETAL DIAMETER           7.98 cm         32 weeks  HEAD CIRCUMFERENCE           30.42 cm         33+5 weeks ABDOMINAL CIRCUMFERENCE           29.19 cm         33+1 weeks FEMUR LENGTH           6.03 cm         31+2 weeks                                                       AVERAGE EGA(BY THIS SCAN):  32+3 weeks  ESTIMATED FETAL WEIGHT:       2012  grams, 54 % BIOPHYSICAL PROFILE:                                                                                                      COMMENTS GROSS BODY MOVEMENT                 2  TONE                2  RESPIRATIONS                2  AMNIOTIC FLUID                2                                                          SCORE:  8/8 (Note: NST was not performed as part of this antepartum testing) ANATOMICAL SURVEY                                                                            COMMENTS CEREBRAL VENTRICLES yes normal  CHOROID PLEXUS yes normal  CEREBELLUM yes normal  CISTERNA MAGNA yes normal              NOSE/LIP yes normal  FACIAL PROFILE yes normal  4 CHAMBERED HEART yes normal  OUTFLOW TRACTS yes normal  DIAPHRAGM yes normal  STOMACH yes normal  RENAL REGION yes normal  BLADDER yes normal      3 VESSEL CORD yes normal              GENITALIA yes normal female     SUSPECTED ABNORMALITIES:  no QUALITY OF SCAN: Satisfactory The current discrepancy in  fetal weight between the twins is 12% which  is not clinically relevant. TECHNICIAN COMMENTS: [place] 32+1 wks,DI/DI twins BABY A:breech left,anterior placenta gr 1,BPP 8/8,SVP of fluid 6.3 cm,fhr 123 bpm,EFW 1762 g 20%,FL 1.4%,discordance 36% BABY B:cephalic right,anterior placenta gr 1,BPP 8/8,SVP of fluid 5.1 cm,fhr 142 bpm,EFW 2012 54% A copy of this report including all images has been saved and backed up to a second source for retrieval if needed. All measures and details of the anatomical scan, placentation, fluid volume and pelvic anatomy are contained in that report. Amber Heide Guile 12/26/2019 9:41 AM Clinical  Impression and recommendations: I have reviewed the sonogram results above, combined with the patient's current clinical course, below are my impressions and any appropriate recommendations for management based on the sonographic findings. 1.  G2P1001 Estimated Date of Delivery: 02/19/20 by serial sonographic evaluations 2.  Fetal sonographic surveillance findings: DiDi twins a). Normal fluid volume doe both twins b). Normal antepartum fetal assessment with BPP 8/8 for both twins c). Normal growth percentile of both twins, 20%/54% with appropriate interval growth 12% discordance which is an acceptable discordance, mostly driven by short femur in twin A 3.  Normal general sonographic findings Recommend continued prenatal evaluations and care based on this sonogram and as clinically indicated from the patient's clinical course. Florian Buff 12/29/2019 6:37 PM  US Fetal BPP W/O Non Stress  Result Date: 12/29/2019 TWINS FOLLOW UP SONOGRAM Maria Cooper is in the office for a follow up sonogram of a twin gestation. She is a 24 y.o. year old G53P1001 with Estimated Date of Delivery: 02/19/20 by LMP now at  14w1dweeks gestation. Thus far the pregnancy has been otherwise complicated by DI/DI twins. The twins are dichorionic/diamniotic. TWIN A GESTATION: PRESENTATION: breech complete FETAL ACTIVITY:          Heart rate         123          The fetus is active. AMNIOTIC FLUID: The amniotic fluid volume single deepest pocket is 6.3 cm. PLACENTA LOCALIZATION:  anterior GRADE 1 CERVIX: Limited view GESTATIONAL AGE AND  BIOMETRICS: Gestational criteria: Estimated Date of Delivery: 02/19/20 by LMP now at 372w1drevious Scans:7          BIPARIETAL DIAMETER           8.09 cm         23+3 weeks HEAD CIRCUMFERENCE           30.56 cm         24 weeks ABDOMINAL CIRCUMFERENCE           27.75 cm         31+5 weeks FEMUR LENGTH           5.64 cm         29+4 weeks   1.4%                                                       AVERAGE  EGA(BY THIS SCAN):  32+3 weeks                                                 ESTIMATED FETAL WEIGHT:       1762  grams, 20 % BIOPHYSICAL PROFILE:                                                                                                      COMMENTS GROSS BODY MOVEMENT                 2  TONE  2  RESPIRATIONS                2  AMNIOTIC FLUID                2                                                          SCORE:  8/8 (Note: NST was not performed as part of this antepartum testing) ANATOMICAL SURVEY                                                                            COMMENTS CEREBRAL VENTRICLES yes normal  CHOROID PLEXUS yes normal  CEREBELLUM yes normal  CISTERNA MAGNA yes normal              NOSE/LIP yes normal  FACIAL PROFILE yes normal  4 CHAMBERED HEART yes normal  OUTFLOW TRACTS yes normal  DIAPHRAGM yes normal  STOMACH yes normal  RENAL REGION yes normal  BLADDER yes normal      3 VESSEL CORD yes normal              GENITALIA yes normal female     SUSPECTED ABNORMALITIES:  no QUALITY OF SCAN: Satisfactory TWIN B GESTATION: PRESENTATION: Cephalic right FETAL ACTIVITY:          Heart rate         142          The fetus is active. AMNIOTIC FLUID: The amniotic fluid volume single deepest pocket is 5.1 cm. PLACENTA LOCALIZATION:  anterior GRADE 1 CERVIX: Limited view GESTATIONAL AGE AND  BIOMETRICS: Gestational criteria: Estimated Date of Delivery: 02/19/20 by LMP now at 11w1dPrevious Scans:7          BIPARIETAL DIAMETER           7.98 cm         32 weeks HEAD CIRCUMFERENCE           30.42 cm         33+5 weeks ABDOMINAL CIRCUMFERENCE           29.19 cm         33+1 weeks FEMUR LENGTH           6.03 cm         31+2 weeks                                                       AVERAGE EGA(BY THIS SCAN):  32+3 weeks                                                 ESTIMATED FETAL WEIGHT:       2012  grams,  54 % BIOPHYSICAL PROFILE:                                                                                                       COMMENTS GROSS BODY MOVEMENT                 2  TONE                2  RESPIRATIONS                2  AMNIOTIC FLUID                2                                                          SCORE:  8/8 (Note: NST was not performed as part of this antepartum testing) ANATOMICAL SURVEY                                                                            COMMENTS CEREBRAL VENTRICLES yes normal  CHOROID PLEXUS yes normal  CEREBELLUM yes normal  CISTERNA MAGNA yes normal              NOSE/LIP yes normal  FACIAL PROFILE yes normal  4 CHAMBERED HEART yes normal  OUTFLOW TRACTS yes normal  DIAPHRAGM yes normal  STOMACH yes normal  RENAL REGION yes normal  BLADDER yes normal      3 VESSEL CORD yes normal              GENITALIA yes normal female     SUSPECTED ABNORMALITIES:  no QUALITY OF SCAN: Satisfactory The current discrepancy in  fetal weight between the twins is 12% which  is not clinically relevant. TECHNICIAN COMMENTS: [place] 32+1 wks,DI/DI twins BABY A:breech left,anterior placenta gr 1,BPP 8/8,SVP of fluid 6.3 cm,fhr 123 bpm,EFW 1762 g 20%,FL 1.4%,discordance 31% BABY B:cephalic right,anterior placenta gr 1,BPP 8/8,SVP of fluid 5.1 cm,fhr 142 bpm,EFW 2012 54% A copy of this report including all images has been saved and backed up to a second source for retrieval if needed. All measures and details of the anatomical scan, placentation, fluid volume and pelvic anatomy are contained in that report. Amber Heide Guile 12/26/2019 9:41 AM Clinical Impression and recommendations: I have reviewed the sonogram results above, combined with the patient's current clinical course, below are my impressions and any appropriate recommendations for management based on the sonographic findings. 1.  G2P1001 Estimated Date of Delivery: 02/19/20 by serial sonographic evaluations 2.  Fetal sonographic surveillance findings: DiDi twins a). Normal fluid volume doe both  twins b). Normal antepartum  fetal assessment with BPP 8/8 for both twins c). Normal growth percentile of both twins, 20%/54% with appropriate interval growth 12% discordance which is an acceptable discordance, mostly driven by short femur in twin A 3.  Normal general sonographic findings Recommend continued prenatal evaluations and care based on this sonogram and as clinically indicated from the patient's clinical course. Florian Buff 12/29/2019 6:37 PM  US OB Follow Up AddL Gest  Result Date: 01/16/2020 TWINS FOLLOW UP SONOGRAM Maria Cooper is in the office for a follow up sonogram of a twin gestation. She is a 24 y.o. year old G23P1001 with Estimated Date of Delivery: 02/19/20 by LMP now at  86w1dweeks gestation. Thus far the pregnancy has been otherwise complicated by DI/DI twins,cholestatis . The twins are dichorionic/diamniotic. TWIN A GESTATION: PRESENTATION: Cephalic left FETAL ACTIVITY:          Heart rate         138          The fetus is active. AMNIOTIC FLUID: The amniotic fluid volume single deepest pocket is 7.3 cm. PLACENTA LOCALIZATION:  anterior GRADE 1 CERVIX: Limited view ADNEXA: The ovaries are normal. GESTATIONAL AGE AND  BIOMETRICS: Gestational criteria: Estimated Date of Delivery: 02/19/20 by LMP now at 378w1drevious Scans:10          BIPARIETAL DIAMETER           8.72 cm         35+1 weeks HEAD CIRCUMFERENCE           32.97 cm         37+3 weeks ABDOMINAL CIRCUMFERENCE           31.73 cm         35+5 weeks FEMUR LENGTH           6.25 cm         32+2 weeks                                                       AVERAGE EGA(BY THIS SCAN):  35 weeks                                                 ESTIMATED FETAL WEIGHT:       2552  grams, 42 % BIOPHYSICAL PROFILE:                                                                                                      COMMENTS GROSS BODY MOVEMENT                 2  TONE                2  RESPIRATIONS  2  AMNIOTIC FLUID                2                                                           SCORE:  8/8 (Note: NST was not performed as part of this antepartum testing) ANATOMICAL SURVEY                                                                            COMMENTS CEREBRAL VENTRICLES yes normal  CHOROID PLEXUS yes normal  CEREBELLUM yes normal                  NOSE/LIP yes normal  FACIAL PROFILE yes normal  4 CHAMBERED HEART yes normal  OUTFLOW TRACTS yes normal  DIAPHRAGM yes normal  STOMACH yes normal  RENAL REGION yes normal  BLADDER yes normal      3 VESSEL CORD yes normal              GENITALIA yes normal female     SUSPECTED ABNORMALITIES:  no QUALITY OF SCAN: Satisfactory TWIN B GESTATION: PRESENTATION: Cephalic right FETAL ACTIVITY:          Heart rate         157          The fetus is active. AMNIOTIC FLUID: The amniotic fluid volume single deepest pocket is 6.3 cm. PLACENTA LOCALIZATION:  anterior GRADE 1 CERVIX: Limited view ADNEXA: The ovaries are normal. GESTATIONAL AGE AND  BIOMETRICS: Gestational criteria: Estimated Date of Delivery: 02/19/20 by LMP now at 44w1dPrevious Scans:10          BIPARIETAL DIAMETER           8.6 cm         34+4 weeks HEAD CIRCUMFERENCE           32.26 cm         36+3 weeks ABDOMINAL CIRCUMFERENCE           32.28 cm         36+1 weeks FEMUR LENGTH           6.53 cm         33+4 weeks                                                       AVERAGE EGA(BY THIS SCAN):  35+1 weeks                                                 ESTIMATED FETAL WEIGHT:       2672  grams, 55 % BIOPHYSICAL PROFILE:  COMMENTS GROSS BODY MOVEMENT                 2  TONE                2  RESPIRATIONS                2  AMNIOTIC FLUID                2                                                          SCORE:  8/8 (Note: NST was not performed as part of this antepartum testing) ANATOMICAL SURVEY                                                                             COMMENTS CEREBRAL VENTRICLES yes normal  CHOROID PLEXUS yes normal  CEREBELLUM yes normal              NASAL BONE yes normal      FACIAL PROFILE yes normal  4 CHAMBERED HEART yes normal  OUTFLOW TRACTS yes normal  DIAPHRAGM yes normal  STOMACH yes normal  RENAL REGION yes normal  BLADDER yes normal      3 VESSEL CORD yes normal              GENITALIA yes normal female     SUSPECTED ABNORMALITIES:  no QUALITY OF SCAN: Satisfactory The current discrepancy in  fetal weight between the twins is 4.5% which  is not clinically relevant. TECHNICIAN COMMENTS: [place] DI/DI TWINS 35+1 wks,normal ovaries BABY A:female,cephalic,left,anterior placenta gr 1,SVP of fluid 7.3 cm,fhr 138 BPM,BPP 8/8,EFW 2552 g 42%,discordance 4.5% BABY B:female,cephalic right,anterior placenta gr 1,SVP of fluid 6.3 cm,FHR 157 BPM,BPP 8/8,EFW 2672 g 55% A copy of this report including all images has been saved and backed up to a second source for retrieval if needed. All measures and details of the anatomical scan, placentation, fluid volume and pelvic anatomy are contained in that report. Amber Heide Guile 01/16/2020 9:37 AM Clinical Impression and recommendations: I have reviewed the sonogram results above, combined with the patient's current clinical course, below are my impressions and any appropriate recommendations for management based on the sonographic findings. 1.  G2P1001 Estimated Date of Delivery: 02/19/20 by serial sonographic evaluations 2.  Fetal sonographic surveillance findings: Kathi Der twins a). Normal fluid volume x 2 b). Normal antepartum fetal assessment with BPP 8/8 x2 c). Normal growth percentile with appropriate interval growth insignificant discordance 3.  Normal general sonographic findings Recommend continued prenatal evaluations and care based on this sonogram and as clinically indicated from the patient's clinical course. Mertie Clause Lasheka Kempner 01/16/2020 10:10 AM  US OB Follow Up AddL Gest  Result Date: 12/29/2019 TWINS FOLLOW  UP SONOGRAM RAYMA HEGG is in the office for a follow up sonogram of a twin gestation. She is a 24 y.o. year old G85P1001 with Estimated Date of Delivery: 02/19/20 by LMP now at  84w1dweeks  gestation. Thus far the pregnancy has been otherwise complicated by DI/DI twins. The twins are dichorionic/diamniotic. TWIN A GESTATION: PRESENTATION: breech complete FETAL ACTIVITY:          Heart rate         123          The fetus is active. AMNIOTIC FLUID: The amniotic fluid volume single deepest pocket is 6.3 cm. PLACENTA LOCALIZATION:  anterior GRADE 1 CERVIX: Limited view GESTATIONAL AGE AND  BIOMETRICS: Gestational criteria: Estimated Date of Delivery: 02/19/20 by LMP now at 70w1dPrevious Scans:7          BIPARIETAL DIAMETER           8.09 cm         23+3 weeks HEAD CIRCUMFERENCE           30.56 cm         24 weeks ABDOMINAL CIRCUMFERENCE           27.75 cm         31+5 weeks FEMUR LENGTH           5.64 cm         29+4 weeks   1.4%                                                       AVERAGE EGA(BY THIS SCAN):  32+3 weeks                                                 ESTIMATED FETAL WEIGHT:       1762  grams, 20 % BIOPHYSICAL PROFILE:                                                                                                      COMMENTS GROSS BODY MOVEMENT                 2  TONE                2  RESPIRATIONS                2  AMNIOTIC FLUID                2                                                          SCORE:  8/8 (Note: NST was not performed as part of this antepartum testing) ANATOMICAL SURVEY  COMMENTS CEREBRAL VENTRICLES yes normal  CHOROID PLEXUS yes normal  CEREBELLUM yes normal  CISTERNA MAGNA yes normal              NOSE/LIP yes normal  FACIAL PROFILE yes normal  4 CHAMBERED HEART yes normal  OUTFLOW TRACTS yes normal  DIAPHRAGM yes normal  STOMACH yes normal  RENAL REGION yes normal  BLADDER yes normal      3  VESSEL CORD yes normal              GENITALIA yes normal female     SUSPECTED ABNORMALITIES:  no QUALITY OF SCAN: Satisfactory TWIN B GESTATION: PRESENTATION: Cephalic right FETAL ACTIVITY:          Heart rate         142          The fetus is active. AMNIOTIC FLUID: The amniotic fluid volume single deepest pocket is 5.1 cm. PLACENTA LOCALIZATION:  anterior GRADE 1 CERVIX: Limited view GESTATIONAL AGE AND  BIOMETRICS: Gestational criteria: Estimated Date of Delivery: 02/19/20 by LMP now at 42w1dPrevious Scans:7          BIPARIETAL DIAMETER           7.98 cm         32 weeks HEAD CIRCUMFERENCE           30.42 cm         33+5 weeks ABDOMINAL CIRCUMFERENCE           29.19 cm         33+1 weeks FEMUR LENGTH           6.03 cm         31+2 weeks                                                       AVERAGE EGA(BY THIS SCAN):  32+3 weeks                                                 ESTIMATED FETAL WEIGHT:       2012  grams, 54 % BIOPHYSICAL PROFILE:                                                                                                      COMMENTS GROSS BODY MOVEMENT                 2  TONE                2  RESPIRATIONS                2  AMNIOTIC FLUID                2  SCORE:  8/8 (Note: NST was not performed as part of this antepartum testing) ANATOMICAL SURVEY                                                                            COMMENTS CEREBRAL VENTRICLES yes normal  CHOROID PLEXUS yes normal  CEREBELLUM yes normal  CISTERNA MAGNA yes normal              NOSE/LIP yes normal  FACIAL PROFILE yes normal  4 CHAMBERED HEART yes normal  OUTFLOW TRACTS yes normal  DIAPHRAGM yes normal  STOMACH yes normal  RENAL REGION yes normal  BLADDER yes normal      3 VESSEL CORD yes normal              GENITALIA yes normal female     SUSPECTED ABNORMALITIES:  no QUALITY OF SCAN: Satisfactory The current discrepancy in  fetal weight between the twins is 12% which   is not clinically relevant. TECHNICIAN COMMENTS: [place] 32+1 wks,DI/DI twins BABY A:breech left,anterior placenta gr 1,BPP 8/8,SVP of fluid 6.3 cm,fhr 123 bpm,EFW 1762 g 20%,FL 1.4%,discordance 81% BABY B:cephalic right,anterior placenta gr 1,BPP 8/8,SVP of fluid 5.1 cm,fhr 142 bpm,EFW 2012 54% A copy of this report including all images has been saved and backed up to a second source for retrieval if needed. All measures and details of the anatomical scan, placentation, fluid volume and pelvic anatomy are contained in that report. Amber Heide Guile 12/26/2019 9:41 AM Clinical Impression and recommendations: I have reviewed the sonogram results above, combined with the patient's current clinical course, below are my impressions and any appropriate recommendations for management based on the sonographic findings. 1.  G2P1001 Estimated Date of Delivery: 02/19/20 by serial sonographic evaluations 2.  Fetal sonographic surveillance findings: DiDi twins a). Normal fluid volume doe both twins b). Normal antepartum fetal assessment with BPP 8/8 for both twins c). Normal growth percentile of both twins, 20%/54% with appropriate interval growth 12% discordance which is an acceptable discordance, mostly driven by short femur in twin A 3.  Normal general sonographic findings Recommend continued prenatal evaluations and care based on this sonogram and as clinically indicated from the patient's clinical course. Florian Buff 12/29/2019 6:37 PM     Assessment: 84w1dEstimated Date of Delivery: 02/19/20  DKathi Dertwins ICP, diagnosed at 26 weeks on actigall 300 TID Previous C section, declines TOL  Plan: Repeat Caesarean section  LFlorian Buff11/02/2020 7:05 AM

## 2020-01-23 NOTE — Lactation Note (Signed)
This note was copied from a baby's chart. 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   Provided "[employer] Patient (employee) Breast Pump Form" and list of available pumps for research.  Encouraged mother to fill out form, have insurance card ready and preferred pump. LC will collect information and bring preferred pump.   All questions answered.   Feeding Feeding Type: Breast Milk with Formula added Nipple Type: Slow - flow  Consult Status Consult Status: Follow-up Date: 01/24/20 Follow-up type: In-patient    Icker Swigert A Higuera Ancidey 01/23/2020, 8:17 PM

## 2020-01-23 NOTE — Lactation Note (Signed)
This note was copied from a baby's chart. Lactation Consultation Note  Patient Name: Astha Probasco Today's Date: 01/23/2020 Reason for consult: Initial assessment;NICU baby;Late-preterm 34-36.6wks;Multiple gestation  Baby A bf p delivery. Baby B in NICU. Pt bf her last baby for 5 mo and is planned breast/bottle with these twins. [employer] set up pump and assisted with initial pumping. Ed on pump cleaning and safe milk storage provided. Mom is a [employer] employee: will provide employee pump info at next contact. Patient was provided with the opportunity to ask questions. All concerns were addressed.     Interventions Interventions: Breast feeding basics reviewed;Expressed milk;DEBP    Consult Status Consult Status: Follow-up Date: 01/24/20 Follow-up type: In-patient    Gwynne Edinger 01/23/2020, 6:03 PM

## 2020-01-23 NOTE — Op Note (Signed)
Preoperative diagnosis:  1.  Intrauterine pregnancy at 3w1dweeks gestation                                         2.  DiDi twins                                         3.  ICP   Postoperative diagnosis:  Same as above   Procedure:  Repeat cesarean section  Surgeon:  LFlorian BuffMD  Assistant:    Anesthesia: Spinal  Findings:  .      Description of operation:  Patient was taken to the operating room and placed in the sitting position where she underwent a spinal anesthetic. She was then placed in the supine position with tilt to the left side. When adequate anesthetic level was obtained she was prepped and draped in usual sterile fashion and a Foley catheter was placed. A Pfannenstiel skin incision was made and carried down sharply to the rectus fascia which was scored in the midline extended laterally. The fascia was taken off the muscles both superiorly and without difficulty. The muscles were divided.  The peritoneal cavity was entered.  Bladder blade was placed, no bladder flap was created.  A low transverse hysterotomy incision was made and delivered viable twins.  Both twins were vertex presentation at the time of delivery.   Twin A delivered at 0Vikingwas a female 2430 grams Apgars 9/9 Twin B 0807 female 2611 grams Apgars 8/9    The uterus was exteriorized. It was closed in 2 layers, the first being a running interlocking layer and the second being an imbricating layer using 0 monocryl on a CTX needle. There was good resulting hemostasis. The uterus tubes and ovaries were all normal. Peritoneal cavity was irrigated vigorously. The muscles and peritoneum were reapproximated loosely. The fascia was closed using 0 Vicryl in running fashion. Subcutaneous tissue was made hemostatic and irrigated. The skin was closed using 4-0 Vicryl on a Keith needle in a subcuticular fashion.  Dermabond was placed for additional wound integrity and to serve as a barrier. Blood loss for the procedure was  650 cc. The patient received a gram of Ancef prophylactically. The patient was taken to the recovery room in good stable condition with all counts being correct x3.  EBL 650 cc  LFlorian Buff11/02/2020 8:34 AM

## 2020-01-23 NOTE — Anesthesia Preprocedure Evaluation (Signed)
Anesthesia Evaluation  Patient identified by MRN, date of birth, ID band Patient awake    Reviewed: Patient's Chart, lab work & pertinent test results  Airway Mallampati: II  TM Distance: >3 FB Neck ROM: Full    Dental  (+) Teeth Intact   Pulmonary neg pulmonary ROS,    Pulmonary exam normal        Cardiovascular negative cardio ROS   Rhythm:Regular Rate:Normal     Neuro/Psych Prior concussions negative psych ROS   GI/Hepatic Neg liver ROS, GERD  ,  Endo/Other  negative endocrine ROS  Renal/GU negative Renal ROS  negative genitourinary   Musculoskeletal negative musculoskeletal ROS (+)   Abdominal (+)  Abdomen: soft. Bowel sounds: normal.  Peds  Hematology negative hematology ROS (+)   Anesthesia Other Findings   Reproductive/Obstetrics (+) Pregnancy                             Anesthesia Physical Anesthesia Plan  ASA: II  Anesthesia Plan: Spinal   Post-op Pain Management:    Induction:   PONV Risk Score and Plan: 2 and Ondansetron, Dexamethasone and Treatment may vary due to age or medical condition  Airway Management Planned: Simple Face Mask and Nasal Cannula  Additional Equipment: None  Intra-op Plan:   Post-operative Plan:   Informed Consent: I have reviewed the patients History and Physical, chart, labs and discussed the procedure including the risks, benefits and alternatives for the proposed anesthesia with the patient or authorized representative who has indicated his/her understanding and acceptance.     Dental advisory given  Plan Discussed with: CRNA  Anesthesia Plan Comments: (Lab Results      Component                Value               Date                      WBC                      13.6 (H)            01/21/2020                HGB                      10.9 (L)            01/21/2020                HCT                      31.9 (L)             01/21/2020                MCV                      91.4                01/21/2020                PLT                      145 (L)             01/21/2020          )  Anesthesia Quick Evaluation

## 2020-01-23 NOTE — Transfer of Care (Signed)
Immediate Anesthesia Transfer of Care Note  Patient: Maria Cooper  Procedure(s) Performed: CESAREAN SECTION WITH BILATERAL TUBAL LIGATION (Bilateral )  Patient Location: PACU  Anesthesia Type:Spinal  Level of Consciousness: awake, alert  and oriented  Airway & Oxygen Therapy: Patient Spontanous Breathing  Post-op Assessment: Report given to RN and Post -op Vital signs reviewed and stable  Post vital signs: Reviewed and stable  Last Vitals:  Vitals Value Taken Time  BP 98/52 01/23/20 0856  Temp    Pulse 73 01/23/20 0859  Resp 21 01/23/20 0859  SpO2 95 % 01/23/20 0859  Vitals shown include unvalidated device data.  Last Pain:  Vitals:   01/23/20 0610  TempSrc: Oral         Complications: No complications documented.

## 2020-01-23 NOTE — Anesthesia Procedure Notes (Signed)
Spinal  Patient location during procedure: OR Start time: 01/23/2020 7:40 AM End time: 01/23/2020 7:41 AM Staffing Performed: anesthesiologist  Anesthesiologist: Darral Dash, DO Preanesthetic Checklist Completed: patient identified, IV checked, site marked, risks and benefits discussed, surgical consent, monitors and equipment checked, pre-op evaluation and timeout performed Spinal Block Patient position: sitting Prep: DuraPrep Patient monitoring: heart rate, cardiac monitor, continuous pulse ox and blood pressure Approach: midline Location: L3-4 Injection technique: single-shot Needle Needle type: Pencan  Needle gauge: 24 G Needle length: 10 cm Assessment Sensory level: T6 Additional Notes Patient identified. Risks/Benefits/Options discussed with patient including but not limited to bleeding, infection, nerve damage, paralysis, failed block, incomplete pain control, headache, blood pressure changes, nausea, vomiting, reactions to medications, itching and postpartum back pain. Confirmed with bedside nurse the patient's most recent platelet count. Confirmed with patient that they are not currently taking any anticoagulation, have any bleeding history or any family history of bleeding disorders. Patient expressed understanding and wished to proceed. All questions were answered. Sterile technique was used throughout the entire procedure. Please see nursing notes for vital signs. Warning signs of high block given to the patient including shortness of breath, tingling/numbness in hands, complete motor block, or any concerning symptoms with instructions to call for help. Patient was given instructions on fall risk and not to get out of bed. All questions and concerns addressed with instructions to call with any issues or inadequate analgesia.

## 2020-01-23 NOTE — Anesthesia Postprocedure Evaluation (Signed)
Anesthesia Post Note  Patient: Maria Cooper  Procedure(s) Performed: CESAREAN SECTION MULTI-GESTATIONAL WITH TUBAL (Bilateral )     Patient location during evaluation: Mother Baby Anesthesia Type: Spinal Level of consciousness: oriented and awake and alert Pain management: pain level controlled Vital Signs Assessment: post-procedure vital signs reviewed and stable Respiratory status: spontaneous breathing and respiratory function stable Cardiovascular status: blood pressure returned to baseline and stable Postop Assessment: no headache, no backache, no apparent nausea or vomiting and able to ambulate Anesthetic complications: no   No complications documented.  Last Vitals:  Vitals:   01/23/20 1000 01/23/20 1009  BP: 102/63 (!) 104/57  Pulse: 71 62  Resp: 14 15  Temp: 36.7 C 36.8 C  SpO2: 98% 98%    Last Pain:  Vitals:   01/23/20 1009  TempSrc:   PainSc: 1                  Quitman Norberto P Mikael Debell

## 2020-01-24 LAB — CBC
HCT: 28.9 % — ABNORMAL LOW (ref 36.0–46.0)
Hemoglobin: 9.6 g/dL — ABNORMAL LOW (ref 12.0–15.0)
MCH: 30.3 pg (ref 26.0–34.0)
MCHC: 33.2 g/dL (ref 30.0–36.0)
MCV: 91.2 fL (ref 80.0–100.0)
Platelets: 121 10*3/uL — ABNORMAL LOW (ref 150–400)
RBC: 3.17 MIL/uL — ABNORMAL LOW (ref 3.87–5.11)
RDW: 16.3 % — ABNORMAL HIGH (ref 11.5–15.5)
WBC: 10.8 10*3/uL — ABNORMAL HIGH (ref 4.0–10.5)
nRBC: 0 % (ref 0.0–0.2)

## 2020-01-24 NOTE — Progress Notes (Signed)
POSTPARTUM PROGRESS NOTE  Subjective: Maria Cooper is a 24 y.o. [phone] s/p rLTCS at 67w1dfor didi twin pregnancy.  She reports she doing well. No acute events overnight. She denies any problems with ambulating, voiding or po intake. Denies nausea or vomiting. She has passed flatus. Reports having a little difficulty with urinating, had similar issue with prior pregnancy. Pain is well controlled.  Lochia is minimal..  Objective: Blood pressure 113/67, pulse 72, temperature 98.5 F (36.9 C), temperature source Oral, resp. rate 16, height 5' 6"  (1.676 m), weight 92.1 kg, last menstrual period 05/15/2019, SpO2 98 %, unknown if currently breastfeeding.  Physical Exam:  General: alert, cooperative and no distress Chest: no respiratory distress Abdomen: soft, non-tender  Uterine Fundus: firm and at level of umbilicus, honeycomb dressing c/d/i  Extremities: No calf swelling or tenderness  no edema  Recent Labs    01/21/20 0900  HGB 10.9*  HCT 31.9*    Assessment/Plan: Maria BARNERis a 24y.o. [phone]/p rLTCS at 328w1dor didi twin pregnancy, repeat c section.  Routine Postpartum Care: Doing well, pain well-controlled.  -- Continue routine care, lactation support. PreOp hgb 10.9, postOp pending. EBL 450. -- Contraception: did not have BTL, would like to do POP's -- Feeding: both  -- Desires circumcision of female infant: consented at bedside   Dispo: Plan for discharge POD#2-3.  MaJanet BerlinMD OB Fellow, Faculty [facility] 01/24/2020 6:53 AM

## 2020-01-24 NOTE — Lactation Note (Signed)
This note was copied from a baby's chart. Lactation Consultation Note  Patient Name: Maria Cooper JHERD'E Date: 01/24/2020 Reason for consult: Follow-up assessment  Follow up visit to 34 hours old infant with 3.29% weight loss. Mother states breastfeeding is going well and does not reports any problems at this point. Mother has been pumping every 3 hours for twin infant in NICU. Mother is supplementing with ~10 mL formula. Infant is having good voids and stools.   Mother completed [employer] (employee) breast pump form and provided insurance card. Documentation completed. "FreeStyleFlex" pump and copy of form delivered.   All questions answered at this time. Encouraged to contact [facility] services for any needs/questions/concerns.    Feeding Feeding Type: Bottle Fed - Formula Nipple Type: Extra Slow Flow  Interventions Interventions: Breast feeding basics reviewed;DEBP;Expressed milk  Consult Status Consult Status: Follow-up Date: 01/25/20 Follow-up type: In-patient    Daryl Beehler A Higuera Ancidey 01/24/2020, 11:28 PM

## 2020-01-25 NOTE — Lactation Note (Signed)
This note was copied from a baby's chart. Lactation Consultation Note  Patient Name: Jaidynn Balster Today's Date: 01/25/2020 Reason for consult: Follow-up assessment;Late-preterm 34-36.6wks  Mom holding infant STS during gavage feeding.  Infant is asleep.  Mom understands to watch for cues.  She is also aware family can call for lactation assistance in NICU by letting the RN set up an appt. With LC.     Mom is pumping every three hours and feels she is using the correct flange fit and denies any pain with pumping.    She denies any questions at this time.      Maternal Data Does the patient have breastfeeding experience prior to this delivery?: Yes  Feeding    LATCH Score                   Interventions Interventions: Skin to skin  Lactation Tools Discussed/Used     Consult Status Consult Status: Follow-up Date: 01/26/20 Follow-up type: In-patient    Ferne Coe [facility] 01/25/2020, 12:54 PM

## 2020-01-25 NOTE — Lactation Note (Signed)
This note was copied from a baby's chart. Lactation Consultation Note  Patient Name: Maria Cooper FGBMS'X Date: 01/25/2020 Reason for consult: Follow-up assessment;Late-preterm 34-36.6wks;Multiple gestation  Visited with mom of 47 hours old twins, baby boy A "Maria Cooper" is < 6 lbs and rooming in with mom and baby girl "Maria Cooper" is in the NICU. This visit took place in mother's room. Baby A is at 5% weight loss and per mom BF is going well, she noted some soreness but both nipples looked intact upon examination.  Offered assistance with latch but mom politely declined, she told LC that baby just fed. Asked mom to call for assistance when needed. LC reviewed some tips for a deep latch and showed parents how they can gently pull baby's chin down to widen the gap when at the breast.   Reviewed normal newborn behavior, size of baby's stomach, pumping schedule, lactogenesis II and cluster feeding. Mom understands that pumping after feedings will be crucial to fully empty the breast and prevent engorgement once her milk comes in. She's been pumping and putting baby boy "A" to breast consistently every 3 hours, praised her for her efforts. She also noted that baby cluster fed last night.  Feeding plan:  1. Encouraged mom to keep feeding baby STS 8-12 times/24 hours or sooner if feeding cues are present 2. She'll start using coconut oil prior pumping to ease discomfort 3. She'll continue pumping every 3 hours; and will continue collecting her EBM to be taken to the NICU and given to baby A (rooming in baby) 4. Dad will help with supplementation of EBM/Similac 22 calorie formula every 3 hours after feedings at the breast  Dad present and supportive. Parents reported all questions and concerns were answered, they're both aware of Maria Cooper OP services and will call PRN.   Maternal Data    Feeding Feeding Type: Bottle Fed - Formula Nipple Type: Extra Slow Flow  LATCH Score                    Interventions Interventions: Breast feeding basics reviewed;Coconut oil;DEBP  Lactation Tools Discussed/Used Tools: Pump;Coconut oil Breast pump type: Double-Electric Breast Pump   Consult Status Consult Status: Follow-up Date: 01/26/20 Follow-up type: In-patient    Maria Cooper 01/25/2020, 9:11 PM

## 2020-01-25 NOTE — Progress Notes (Addendum)
POSTPARTUM PROGRESS NOTE  POD #2  Subjective:  Maria Cooper is a 24 y.o. [phone] s/p LTCS at [redacted]w[redacted]d  She reports she doing well. No acute events overnight. She reports she is doing well. She denies any problems with ambulating, voiding or po intake. Denies nausea or vomiting. She has passed flatus. Pain is well controlled.  Lochia is very minimal.  Objective: Blood pressure 108/68, pulse 86, temperature 99.4 F (37.4 C), temperature source Oral, resp. rate 18, height 5' 6"  (1.676 m), weight 92.1 kg, last menstrual period 05/15/2019, SpO2 100 %, unknown if currently breastfeeding.  Physical Exam:  General: alert, cooperative and no distress Chest: no respiratory distress Heart:regular rate, distal pulses intact Abdomen: soft, nontender,  Uterine Fundus: firm, appropriately tender DVT Evaluation: No calf swelling or tenderness Extremities: No LE edema Skin: warm, dry; incision clean/dry/intact w/ honeycomb dressing in place  Recent Labs    01/24/20 0729  HGB 9.6*  HCT 28.9*    Assessment/Plan: Maria OSERis a 24y.o. [phone]/p LTCS at 315w1dor twin pregnancy.  POD#2- Doing welll; pain well controlled. H/H appropriate  Routine postpartum care  OOB, ambulated  Lovenox for VTE prophylaxis Anemia: asymptomatic Contraception: POP's  Feeding: BR/BO  Dispo: Plan for discharge home tomorrow.   LOS: 2 days   SiGerlene FeeDO 01/25/2020, 7:25 AM PGY-2, [facility]  I saw and evaluated the patient. I agree with the findings and the plan of care as documented in the resident's note.  JuSharene SkeansMD OBMcleod Lorisamily Medicine Fellow, FaSurgcenter Of Southern Marylandor WoAdobe Surgery Center PcCoAltha

## 2020-01-25 NOTE — Lactation Note (Signed)
This note was copied from a baby's chart. Lactation Consultation Note  Patient Name: Maria Cooper Today's Date: 01/25/2020   Mom states infant is feeding well at the breast.  Her breast have felt more full and are feeling softer after he feeds as of today.  Mom is bottle feeding formula/ or EBM after BF.  RN was able to bottle feed 74m of formula with a purple extra slow flow nipple.    LC discussed with mom paced bottle feeding.  Mom did state infant is not opening his mouth very wide, and she feels like he is on the nipple more due to the narrow gape.  LC suggested mom call out for latch observation today when baby CCheri Rousgoes to the breast.    Pediatrician present and encouraged mom to continue to put infant to breast and supplement after feeding.  Mom was praised by pediatrician and LC on efforts of pumping and latching.    Maternal Data    Feeding Feeding Type: Bottle Fed - Formula Nipple Type: Extra Slow Flow  LATCH Score                   Interventions    Lactation Tools Discussed/Used     Consult Status      KFerne CoeBAslaska Surgery Center11/14/2021, 1:00 PM

## 2020-01-26 ENCOUNTER — Ambulatory Visit: Payer: Self-pay

## 2020-01-26 MED ORDER — OXYCODONE HCL 5 MG PO TABS
5.0000 mg | ORAL_TABLET | ORAL | 0 refills | Status: DC | PRN
Start: 2020-01-26 — End: 2020-03-03

## 2020-01-26 NOTE — Discharge Instructions (Signed)
Please call to schedule an incision check 1 week after delivery, schedule circumcision for baby boy per office protocol, and your postpartum visit 4-6 weeks after delivery.

## 2020-01-26 NOTE — Discharge Summary (Signed)
Postpartum Discharge Summary      Patient Name: Maria Cooper DOB: [birth year] MRN: [id number]  Date of admission: 01/23/2020 Delivery date:   Shakeria, Robinette [[id number]]  01/23/2020    Leshia, Kope [[id number]]  01/23/2020   Delivering provider:    Merla Riches [[id number]]  Charissa, Knowles [[id number]]  Tania Ade H   Date of discharge: 01/26/2020  Admitting diagnosis: S/P cesarean section [Z98.891] Intrauterine pregnancy: [redacted]w[redacted]d    Secondary diagnosis:  Active Problems:   S/P cesarean section  Additional problems: none    Discharge diagnosis: Term Pregnancy Delivered                                              Post partum procedures:none Augmentation: N/A Complications: None  Hospital course: Sceduled C/S   24y.o. yo G2P1103 at 394w1das admitted to the hospital 01/23/2020 for scheduled cesarean section with the following indication:Elective Repeat, Prior Uterine Surgery and Multifetal Gestation.Delivery details are as follows:  Membrane Rupture Time/Date:    MaPurity, Irmen0[[id number]]8:04 AM    MaLemar Lofty0[[id number]]8:07 AM  ,   MaVester, Titsworth0[[id number]]01/23/2020    MaAnge, Puskas0[[id number]]01/23/2020    Delivery Method:   MaMerla Riches0[[id number]]C-Section, Low Transverse    MaAlbena, Comes0[[id number]]C-Section, Low Transverse   Details of operation can be found in separate operative note.  Patient had an uncomplicated postpartum course.  She is ambulating, tolerating a regular diet, passing flatus, and urinating well. Patient is discharged home in stable condition on  01/26/20        Newborn Data: Birth date:   MaManika, Hast0[[id number]]01/23/2020    MaLucretia, Pendley0[[id number]]01/23/2020   Birth time:   MaConi, Homesley0[[id number]]8:05 AM    MaMichaelyn, Wall0[[id number]]8:07 AM    Gender:   MaMaygan, Koeller0[[id number]]Female    MaTyjanae, Bartek0[[id number]]Female   Living status:   MaKimoni, Pickerill0[[id number]]Living    MaDeloise, Marchant0[[id number]]Living   Apgars:   MaWhitnee, Orzel0[[id number]]9 [address]0[[id number]]8 [place],   MaSkylen, Danielsen0[[id number]]9 [address]0[[id number]]9   Weight:   MaArmandina, Iman0[[id number]]  2094    MaJalee, Saine0[[id number]]2611 g      Magnesium Sulfate received: No BMZ received: Yes Rhophylac:N/A MMR:N/A T-DaP:not given Flu: No Transfusion:No  Physical exam  Vitals:   01/25/20 0516 01/25/20 1407 01/25/20 2055 01/26/20 0505  BP: 108/68 121/68 124/74 100/72  Pulse: 86 (!) 109 99 85  Resp: _0 Temp: 99.4 F (37.4 C)  98.8 F (37.1 C) 99 F (37.2 C)  TempSrc: Oral  Oral Oral  SpO2: 100%  97% 98%  Weight:      Height:       General: alert, cooperative and no distress Lochia: appropriate Uterine Fundus: firm Incision: Healing well with no significant drainage, No significant erythema, Dressing is clean, dry, and intact DVT Evaluation: No evidence of DVT seen on physical exam. Negative Homan's  sign. No cords or calf tenderness. No significant calf/ankle edema. Labs: Lab Results  Component Value Date   WBC 10.8 (H) 01/24/2020   HGB 9.6 (L) 01/24/2020   HCT 28.9 (L) 01/24/2020   MCV 91.2 01/24/2020   PLT 121 (L) 01/24/2020   CMP Latest Ref Rng & Units 01/21/2020  Glucose 70 - 99 mg/dL 102(H)  BUN 6 - 20 mg/dL <5(L)  Creatinine 0.44 - 1.00 mg/dL 0.48  Sodium 135 - 145 mmol/L 136  Potassium 3.5 - 5.1 mmol/L 3.4(L)  Chloride 98 - 111 mmol/L 106  CO2 22 - 32 mmol/L 19(L)  Calcium 8.9 - 10.3 mg/dL 8.8(L)  Total Protein 6.5 - 8.1 g/dL 5.9(L)  Total Bilirubin 0.3 - 1.2 mg/dL 0.5  Alkaline Phos 38 - 126 U/L 125  AST 15 - 41 U/L 24  ALT 0 - 44 U/L 11   Edinburgh Score: Edinburgh  Postnatal Depression Scale Screening Tool 01/24/2020  I have been able to laugh and see the funny side of things. 0  I have looked forward with enjoyment to things. 0  I have blamed myself unnecessarily when things went wrong. 0  I have been anxious or worried for no good reason. 0  I have felt scared or panicky for no good reason. 0  Things have been getting on top of me. 0  I have been so unhappy that I have had difficulty sleeping. 0  I have felt sad or miserable. 0  I have been so unhappy that I have been crying. 0  The thought of harming myself has occurred to me. 0  Edinburgh Postnatal Depression Scale Total 0  Some encounter information is confidential and restricted. Go to Review Flowsheets activity to see all data.     After visit meds:  Allergies as of 01/26/2020      Reactions   Citrus Other (See Comments)   Blisters in mouth, and swells   Nyquil Multi-symptom [pseudoeph-doxylamine-dm-apap] Nausea And Vomiting      Medication List    STOP taking these medications   NIFEdipine 10 MG capsule Commonly known as: PROCARDIA   ursodiol 300 MG capsule Commonly known as: ACTIGALL     TAKE these medications   acetaminophen 500 MG tablet Commonly known as: TYLENOL Take 500 mg by mouth every 6 (six) hours as needed for mild pain or moderate pain.   aspirin EC 81 MG tablet Take 81 mg by mouth daily. Swallow whole.   hydrOXYzine 25 MG tablet Commonly known as: ATARAX/VISTARIL Take 1 tablet (25 mg total) by mouth every 6 (six) hours as needed.   oxyCODONE 5 MG immediate release tablet Commonly known as: Oxy IR/ROXICODONE Take 1-2 tablets (5-10 mg total) by mouth every 4 (four) hours as needed for moderate pain.   prenatal vitamin w/FE, FA 27-1 MG Tabs tablet Take 1 tablet by mouth daily at 12 noon. What changed: when to take this        Discharge home in stable condition Infant Feeding: Breast Infant Disposition:home with mother (boy), NICU (girl) Discharge  instruction: per After Visit Summary and Postpartum booklet. Activity: Advance as tolerated. Pelvic rest for 6 weeks.  Diet: routine diet Future Appointments: Future Appointments  Date Time Provider [place]  01/30/2020 11:30 AM Florian Buff, MD [facility]   Follow up Visit:  Follow-up Information    [facility]. Schedule an appointment as soon as possible for a visit.   Specialty: Obstetrics and Gynecology Why: For circumcision to  be done. Also, schedule 1 week incision check and 4 weeks postpartum visit. Contact information: [address] [place] [phone]               Please schedule this patient for a In person postpartum visit in 4 weeks with the following provider: MD. Additional Postpartum F/U:Incision check 1 week  High risk pregnancy complicated by: Twins Delivery mode:     Banita, Lehn [[id number]]  C-Section, Low Transverse    Kendyl, Festa [[id number]]  C-Section, Low Transverse   Anticipated Birth Control:  POPs   01/26/2020 Laury Deep, CNM

## 2020-01-26 NOTE — Lactation Note (Signed)
This note was copied from a baby's chart. Lactation Consultation Note  Patient Name: Arti Trang Today's Date: 01/26/2020 Reason for consult: Follow-up assessment   LC Follow Up Visit:  Attempted to visit with mother, however, she was not present.  She was discharged home today.   Maternal Data    Feeding Feeding Type: Formula  LATCH Score                   Interventions    Lactation Tools Discussed/Used     Consult Status Consult Status: Follow-up Date: 01/27/20 Follow-up type: In-patient    Lesleyanne Politte R Aryella Besecker 01/26/2020, 3:08 PM

## 2020-01-26 NOTE — Progress Notes (Signed)
Patient screened out for psychosocial assessment since none of the following apply:  Psychosocial stressors documented in mother or baby's chart  Gestation less than 32 weeks  Code at delivery   Infant with anomalies Please contact the Clinical Social Worker if specific needs arise, by MOB's request, or if MOB scores greater than 9/yes to question 10 on Edinburgh Postpartum Depression Screen.  Abundio Miu, [place] Worker [facility] Cell#: [phone]

## 2020-01-27 ENCOUNTER — Ambulatory Visit: Payer: Self-pay

## 2020-01-27 LAB — SURGICAL PATHOLOGY

## 2020-01-27 NOTE — Lactation Note (Signed)
This note was copied from a baby's chart. Lactation Consultation Note  Patient Name: Maria Cooper Today's Date: 01/27/2020 Reason for consult: Follow-up assessment;NICU baby;Multiple gestation  LC to room for follow up visit.  Mom's milk is in and she is pumping often. She denies engorgement but states " sometimes it's hard to get the milk out." Reviewed ice for swelling and warm, moist heat to help milk letdown. Patient was provided with the opportunity to ask questions. All concerns were addressed.  Will plan follow up visit.    Consult Status Consult Status: Follow-up Date: 01/28/20 Follow-up type: In-patient    Gwynne Edinger 01/27/2020, 5:59 PM

## 2020-01-30 ENCOUNTER — Ambulatory Visit (INDEPENDENT_AMBULATORY_CARE_PROVIDER_SITE_OTHER): Payer: No Typology Code available for payment source | Admitting: Obstetrics & Gynecology

## 2020-01-30 ENCOUNTER — Encounter: Payer: Self-pay | Admitting: Obstetrics & Gynecology

## 2020-01-30 ENCOUNTER — Other Ambulatory Visit: Payer: No Typology Code available for payment source

## 2020-01-30 ENCOUNTER — Other Ambulatory Visit: Payer: Self-pay

## 2020-01-30 ENCOUNTER — Encounter: Payer: No Typology Code available for payment source | Admitting: Obstetrics & Gynecology

## 2020-01-30 VITALS — BP 116/79 | HR 123 | Ht 66.0 in | Wt 173.0 lb

## 2020-01-30 DIAGNOSIS — Z98891 History of uterine scar from previous surgery: Secondary | ICD-10-CM

## 2020-01-30 NOTE — Progress Notes (Signed)
  HPI: Patient returns for routine postoperative follow-up having undergone repeat C section 01/23/20 on .  The patient's immediate postoperative recovery has been unremarkable. Since hospital discharge the patient reports noproblems.   Current Outpatient Medications: acetaminophen (TYLENOL) 500 MG tablet, Take 500 mg by mouth every 6 (six) hours as needed for mild pain or moderate pain. , Disp: , Rfl:  prenatal vitamin w/FE, FA (PRENATAL 1 + 1) 27-1 MG TABS tablet, Take 1 tablet by mouth daily at 12 noon. (Patient taking differently: Take 1 tablet by mouth daily. ), Disp: 30 tablet, Rfl: 12 aspirin EC 81 MG tablet, Take 81 mg by mouth daily. Swallow whole. (Patient not taking: Reported on 01/30/2020), Disp: , Rfl:  hydrOXYzine (ATARAX/VISTARIL) 25 MG tablet, Take 1 tablet (25 mg total) by mouth every 6 (six) hours as needed. (Patient not taking: Reported on 12/31/2019), Disp: 90 tablet, Rfl: 3 oxyCODONE (OXY IR/ROXICODONE) 5 MG immediate release tablet, Take 1-2 tablets (5-10 mg total) by mouth every 4 (four) hours as needed for moderate pain. (Patient not taking: Reported on 01/30/2020), Disp: 30 tablet, Rfl: 0  No current facility-administered medications for this visit.    Blood pressure 116/79, pulse (!) 123, height 5' 6"  (1.676 m), weight 173 lb (78.5 kg), currently breastfeeding.  Physical Exam: Incision clean dry intact Abdomen is benign  Diagnostic Tests:   Pathology: normal  Impression: S/p repeat C section  Plan:   Follow up: 5  weeks  Florian Buff, MD

## 2020-02-05 ENCOUNTER — Encounter (HOSPITAL_COMMUNITY): Payer: Self-pay | Admitting: Obstetrics & Gynecology

## 2020-02-05 ENCOUNTER — Ambulatory Visit: Payer: Self-pay

## 2020-02-05 NOTE — Lactation Note (Signed)
This note was copied from a baby's chart. Lactation Consultation Note  Patient Name: Maria Cooper [id number] Date: 02/05/2020 Reason for consult: Follow-up assessment;NICU baby   LC to room for follow up visit.  Mom and baby bf for 18 minutes. Baby sleeping soundly in mom's arms during visit. Mom continues to pump. When not breastfeeding, she pumps about 1101ms. Baby A is breastfeeding at home without difficulty. LC provided breast milk labels. Patient was provided with the opportunity to ask questions. All concerns were addressed.  Will plan follow up visit.   Consult Status Consult Status: Follow-up Date: 02/06/20 Follow-up type: In-patient    LGwynne Edinger11/25/2021, 3:33 PM

## 2020-02-08 ENCOUNTER — Ambulatory Visit: Payer: Self-pay

## 2020-02-08 NOTE — Lactation Note (Signed)
This note was copied from a baby's chart. Lactation Consultation Note  Patient Name: Ashna Dorough Today's Date: 02/08/2020   Lactation visit was attempted, but Mom was at breakfast. The NICU nurse said that infant was to be discharged home today. Infant was noted to gain 80 g over the last 24 hours.   Lactation to f/u later, if possible.    Matthias Hughs [facility] 02/08/2020, 9:28 AM

## 2020-02-08 NOTE — Lactation Note (Signed)
This note was copied from a baby's chart. Lactation Consultation Note  Patient Name: Maria Cooper [id number] Date: 02/08/2020 Reason for consult: Follow-up assessment;NICU baby  I saw Mom in the NICU hallway. Mom says she has no questions or breast complaints and that breastfeeding is going well with both twins. Because Mom had one infant at home and one in the NICU, Mom had been pumping after  feedings to provide for the infant she was separated from. Mom is able to express 30 oz/day.  Mom knows how to reach [place] for any post-discharge questions. She is also aware that we could arrange outpatient lactation visits for the twins, if desired.    Matthias Hughs [facility] 02/08/2020, 9:42 AM

## 2020-03-03 ENCOUNTER — Other Ambulatory Visit: Payer: Self-pay

## 2020-03-03 ENCOUNTER — Ambulatory Visit (INDEPENDENT_AMBULATORY_CARE_PROVIDER_SITE_OTHER): Payer: No Typology Code available for payment source | Admitting: Women's Health

## 2020-03-03 ENCOUNTER — Encounter: Payer: Self-pay | Admitting: Women's Health

## 2020-03-03 DIAGNOSIS — Z3202 Encounter for pregnancy test, result negative: Secondary | ICD-10-CM

## 2020-03-03 DIAGNOSIS — Z98891 History of uterine scar from previous surgery: Secondary | ICD-10-CM

## 2020-03-03 DIAGNOSIS — Z8719 Personal history of other diseases of the digestive system: Secondary | ICD-10-CM

## 2020-03-03 DIAGNOSIS — Z8759 Personal history of other complications of pregnancy, childbirth and the puerperium: Secondary | ICD-10-CM

## 2020-03-03 LAB — POCT URINE PREGNANCY: Preg Test, Ur: NEGATIVE

## 2020-03-03 MED ORDER — NORETHINDRONE 0.35 MG PO TABS: 1.0000 | ORAL_TABLET | Freq: Every day | ORAL | 11 refills | Status: DC

## 2020-03-03 NOTE — Progress Notes (Signed)
POSTPARTUM VISIT Patient name: Maria Cooper MRN [id number]  Date of birth: [birth year] Chief Complaint:   Postpartum Care (Interested in birth control pills; is breastfeeding)  History of Present Illness:   Maria Cooper is a 24 y.o. [id number] Caucasian female being seen today for a postpartum visit. She is 5 weeks postpartum following a repeat cesarean section, low transverse incision at 36.1 gestational weeks d/t twins & ICP. IOL: No, for n/a. Anesthesia: spinal.  Laceration: n/a.  Complications: none. Inpatient contraception: no.   Pregnancy complicated by twins & ICP. Tobacco use: no. Substance use disorder: no. Last pap smear: 08/07/19 and results were normal. Next pap smear due: 2024 No LMP recorded. (Menstrual status: Lactating).  Postpartum course has been uncomplicated. Bleeding no bleeding. Bowel function is normal. Bladder function is normal. Urinary incontinence? No, fecal incontinence? No Patient is not sexually active. Last sexual activity: prior to birth of baby.  Desired contraception: POPs. Patient does not want a pregnancy in the near future.    Wants medical exemption note from Covid vaccine d/t breastfeeding.  The pregnancy intention screening data noted above was reviewed. Potential methods of contraception were discussed. The patient elected to proceed with Oral Contraceptive.   Edinburgh Postpartum Depression Screening: negative  Edinburgh Postnatal Depression Scale - 03/03/20 1107      Edinburgh Postnatal Depression Scale:  In the Past 7 Days   I have been able to laugh and see the funny side of things. 0    I have looked forward with enjoyment to things. 0    I have blamed myself unnecessarily when things went wrong. 0    I have been anxious or worried for no good reason. 1    I have felt scared or panicky for no good reason. 0    Things have been getting on top of me. 0    I have been so unhappy that I have had difficulty sleeping. 0    I have felt sad or  miserable. 0    I have been so unhappy that I have been crying. 0    The thought of harming myself has occurred to me. 0    Edinburgh Postnatal Depression Scale Total 1          Baby's course has been uncomplicated. Baby is feeding by breast: milk supply adequate. Infant has a pediatrician/family doctor? Yes.  Childcare strategy if returning to work/school: husband.  Pt has material needs met for her and baby: Yes.   Review of Systems:   Pertinent items are noted in HPI Denies Abnormal vaginal discharge w/ itching/odor/irritation, headaches, visual changes, shortness of breath, chest pain, abdominal pain, severe nausea/vomiting, or problems with urination or bowel movements. Pertinent History Reviewed:  Reviewed past medical,surgical, obstetrical and family history.  Reviewed problem list, medications and allergies. OB History  Gravida Para Term Preterm AB Living  _0 0 3  SAB IAB Ectopic Multiple Live Births  0 0 0 1 3    # Outcome Date GA Lbr Len/2nd Weight Sex Delivery Anes PTL Lv  2A Preterm 01/23/20 [redacted]w[redacted]d 5 lb 5.7 oz (2.43 kg) M CS-LTranv Spinal  LIV  2B Preterm 01/23/20 [id number] lb 12.1 oz (2.611 kg) F CS-LTranv Spinal  LIV  1 Term 01/24/18 [id number] 8 lb 14.2 oz (4.031 kg) M CS-LTranv EPI N LIV   Physical Assessment:   Vitals:   03/03/20 1115  BP: 120/72  Pulse: 97  Weight: 168  lb (76.2 kg)  Height: _0  (1.676 m)  Body mass index is 27.12 kg/m.       Physical Examination:   General appearance: alert, well appearing, and in no distress  Mental status: alert, oriented to person, place, and time  Skin: warm & dry   Cardiovascular: normal heart rate noted   Respiratory: normal respiratory effort, no distress   Breasts: deferred, no complaints   Abdomen: soft, non-tender, c/s incision well healed   Pelvic: examination not indicated  Rectal: not examined   Extremities: no edema  Chaperone: N/A         Results for orders placed or performed in visit on  03/03/20 (from the past 24 hour(s))  POCT urine pregnancy   Collection Time: 03/03/20 11:20 AM  Result Value Ref Range   Preg Test, Ur Negative Negative    Assessment & Plan:  1) Postpartum exam 2) 5 wks s/p repeat cesarean section, low transverse incision twins 3) breast feeding 4) Depression screening 5) Contraception management Rx micronor w/ 11RF, understands has to take at exact same time daily to be effective, if late taking use condom as back-up  6) COVID vaccine during breastfeeding discussion: Per the CDC pregnant and recently pregnant people are more likely to get severely ill with COVID-19 compared with non-pregnant people.  Based on how these vaccines work in the body, experts believe they are unlikely to pose a risk for people who are pregnant or breastfeeding. However, there are currently limited data on the safety of COVID-19 vaccines in pregnant and breastfeeding people.  Ultimately, getting vaccinated is a personal choice. Gave printed info on AVS directly from the CDC 'COVID-19 Vaccines While Pregnant or Breastfeeding'. Pt declines vaccine and medical exemptions forms completed. F/U 75mhs  Essential components of care per ACOG recommendations:  1.  Mood and well being:   If positive depression screen, discussed and plan developed.   If using tobacco we discussed reduction/cessation and risk of relapse  If current substance abuse, we discussed and referral to local resources was offered.   2. Infant care and feeding:   If breastfeeding, discussed returning to work, pumping, breastfeeding-associated pain, guidance regarding return to fertility while lactating if not using another method. If needed, patient was provided with a letter to be allowed to pump q 2-3hrs to support lactation in a private location with access to a refrigerator to store breastmilk.    Recommended that all caregivers be immunized for flu, pertussis and other preventable communicable diseases  If pt  does not have material needs met for her/baby, referred to local resources for help obtaining these.  3. Sexuality, contraception and birth spacing  Provided guidance regarding sexuality, management of dyspareunia, and resumption of intercourse  Discussed avoiding interpregnancy interval <627ms and recommended birth spacing of 18 months  4. Sleep and fatigue  Discussed coping options for fatigue and sleep disruption  Encouraged family/partner/community support of 4 hrs of uninterrupted sleep to help with mood and fatigue  5. Physical recovery   If pt had a C/S, assessed incisional pain and providing guidance on normal vs prolonged recovery  If pt had a laceration, perineal healing and pain reviewed.   If urinary or fecal incontinence, discussed management and referred to PT or uro/gyn if indicated   Patient is safe to resume physical activity. Discussed attainment of healthy weight.  6.  Chronic disease management  Discussed pregnancy complications if any, and their implications for future childbearing and long-term maternal health.  Review  recommendations for prevention of recurrent pregnancy complications, such as 17 hydroxyprogesterone caproate to reduce risk for recurrent PTB not applicable, or aspirin to reduce risk of preeclampsia not applicable.  Pt had GDM: No. If yes, 2hr GTT scheduled: not applicable.  Reviewed medications and non-pregnant dosing including consideration of whether pt is breastfeeding using a reliable resource such as LactMed: not applicable  Referred for f/u w/ PCP or subspecialist providers as indicated: not applicable  7. Health maintenance  Mammogram at 25yo or earlier if indicated  Pap smears as indicated  Meds:  Meds ordered this encounter  Medications   norethindrone (MICRONOR) 0.35 MG tablet    Sig: Take 1 tablet (0.35 mg total) by mouth daily.    Dispense:  28 tablet    Refill:  11    Order Specific Question:   Supervising Provider     Answer:   Florian Buff [[id number]]    Follow-up: Return in about 2 months (around 05/04/2020) for MyChart Video w/ me.   Orders Placed This Encounter  Procedures   POCT urine pregnancy    Roma Schanz CNM, [facility] 03/03/2020 11:54 AM

## 2020-03-17 ENCOUNTER — Encounter: Payer: Self-pay | Admitting: *Deleted

## 2020-05-04 ENCOUNTER — Telehealth: Payer: No Typology Code available for payment source | Admitting: Women's Health

## 2020-07-11 DIAGNOSIS — K760 Fatty (change of) liver, not elsewhere classified: Secondary | ICD-10-CM

## 2020-07-11 HISTORY — DX: Fatty (change of) liver, not elsewhere classified: K76.0

## 2020-07-15 ENCOUNTER — Ambulatory Visit: Payer: No Typology Code available for payment source | Admitting: Nurse Practitioner

## 2020-07-15 ENCOUNTER — Encounter: Payer: Self-pay | Admitting: Nurse Practitioner

## 2020-07-15 ENCOUNTER — Other Ambulatory Visit (INDEPENDENT_AMBULATORY_CARE_PROVIDER_SITE_OTHER): Payer: No Typology Code available for payment source

## 2020-07-15 VITALS — BP 100/60 | HR 60 | Ht 66.0 in | Wt 163.0 lb

## 2020-07-15 DIAGNOSIS — R7989 Other specified abnormal findings of blood chemistry: Secondary | ICD-10-CM | POA: Diagnosis not present

## 2020-07-15 DIAGNOSIS — R110 Nausea: Secondary | ICD-10-CM

## 2020-07-15 DIAGNOSIS — R1011 Right upper quadrant pain: Secondary | ICD-10-CM

## 2020-07-15 LAB — HEPATIC FUNCTION PANEL
ALT: 60 U/L — ABNORMAL HIGH (ref 0–35)
AST: 29 U/L (ref 0–37)
Albumin: 4.7 g/dL (ref 3.5–5.2)
Alkaline Phosphatase: 196 U/L — ABNORMAL HIGH (ref 39–117)
Bilirubin, Direct: 0.1 mg/dL (ref 0.0–0.3)
Total Bilirubin: 0.6 mg/dL (ref 0.2–1.2)
Total Protein: 7.5 g/dL (ref 6.0–8.3)

## 2020-07-15 NOTE — Patient Instructions (Signed)
If you are age 26 or younger, your body mass index should be between 19-25. Your Body mass index is 26.31 kg/m. If this is out of the aformentioned range listed, please consider follow up with your Primary Care Provider.   LABS:  Lab work has been ordered for you today. Our lab is located in the basement. Press "B" on the elevator. The lab is located at the first door on the left as you exit the elevator.  HEALTHCARE LAWS AND MY CHART RESULTS: Due to recent changes in healthcare laws, you may see the results of your imaging and laboratory studies on MyChart before your provider has had a chance to review them.   We understand that in some cases there may be results that are confusing or concerning to you. Not all laboratory results come back in the same time frame and the provider may be waiting for multiple results in order to interpret others.  Please give [place] 48 hours in order for your provider to thoroughly review all the results before contacting the office for clarification of your results.   IMAGING:  . You will be contacted by [facility] (Your caller ID will indicate phone # [phone]) in the next 2 days to schedule your Right upper quadrant ultrasound. If you have not heard from them within 2 business days, please call [place] at [phone] to follow up on the status of your appointment.    We will call you once we have all the results. It was great seeing you today! Thank you for entrusting me with your care and choosing [facility].  Tye Savoy, NP
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
## 2020-07-15 NOTE — Progress Notes (Signed)
Agree with assessment and plan as outlined. I would also add celiac serologies, TTG IgA and total IgA to see if okay, as active celiac can also cause elevated LAEs. Will await initial workup.

## 2020-07-16 ENCOUNTER — Other Ambulatory Visit: Payer: Self-pay

## 2020-07-16 DIAGNOSIS — K909 Intestinal malabsorption, unspecified: Secondary | ICD-10-CM

## 2020-07-20 LAB — HEPATITIS B SURFACE ANTIGEN: Hepatitis B Surface Ag: NONREACTIVE

## 2020-07-20 LAB — HEPATITIS B CORE ANTIBODY, TOTAL: Hep B Core Total Ab: NONREACTIVE

## 2020-07-20 LAB — ANTI-NUCLEAR AB-TITER (ANA TITER): ANA Titer 1: 1:80 {titer} — ABNORMAL HIGH

## 2020-07-20 LAB — ANA: Anti Nuclear Antibody (ANA): POSITIVE — AB

## 2020-07-20 LAB — MITOCHONDRIAL ANTIBODIES: Mitochondrial M2 Ab, IgG: <=20 U

## 2020-07-20 LAB — ANTI-SMOOTH MUSCLE ANTIBODY, IGG: Actin (Smooth Muscle) Antibody (IGG): <20 U (ref <20)

## 2020-07-20 LAB — HEPATITIS B SURFACE ANTIBODY,QUALITATIVE: Hep B S Ab: BORDERLINE — AB

## 2020-07-21 ENCOUNTER — Other Ambulatory Visit: Payer: Self-pay

## 2020-07-21 DIAGNOSIS — K909 Intestinal malabsorption, unspecified: Secondary | ICD-10-CM

## 2020-07-21 DIAGNOSIS — R7989 Other specified abnormal findings of blood chemistry: Secondary | ICD-10-CM

## 2020-07-21 DIAGNOSIS — R1011 Right upper quadrant pain: Secondary | ICD-10-CM

## 2020-07-30 ENCOUNTER — Ambulatory Visit (HOSPITAL_COMMUNITY)
Admission: RE | Admit: 2020-07-30 | Discharge: 2020-07-30 | Disposition: A | Discharge status: Home / Self Care | Payer: No Typology Code available for payment source | Source: Ambulatory Visit | Attending: Nurse Practitioner | Admitting: Nurse Practitioner

## 2020-07-30 ENCOUNTER — Other Ambulatory Visit: Payer: No Typology Code available for payment source

## 2020-07-30 ENCOUNTER — Other Ambulatory Visit: Payer: Self-pay

## 2020-07-30 ENCOUNTER — Ambulatory Visit (HOSPITAL_COMMUNITY): Payer: No Typology Code available for payment source

## 2020-07-30 ENCOUNTER — Other Ambulatory Visit (HOSPITAL_COMMUNITY): Payer: No Typology Code available for payment source

## 2020-07-30 DIAGNOSIS — R7989 Other specified abnormal findings of blood chemistry: Secondary | ICD-10-CM | POA: Diagnosis present

## 2020-07-30 DIAGNOSIS — R110 Nausea: Secondary | ICD-10-CM | POA: Diagnosis present

## 2020-07-30 DIAGNOSIS — R1011 Right upper quadrant pain: Secondary | ICD-10-CM

## 2020-07-30 DIAGNOSIS — K909 Intestinal malabsorption, unspecified: Secondary | ICD-10-CM

## 2020-07-30 NOTE — Addendum Note (Signed)
Addended by: Octavio Manns E on: 07/30/2020 10:42 AM   Modules accepted: Orders

## 2020-07-31 LAB — IGA: IgA/Immunoglobulin A, Serum: 119 mg/dL (ref 87–352)

## 2020-07-31 LAB — HEPATIC FUNCTION PANEL
ALT: 66 IU/L — ABNORMAL HIGH (ref 0–32)
AST: 39 IU/L (ref 0–40)
Albumin: 4.7 g/dL (ref 3.9–5.0)
Alkaline Phosphatase: 240 IU/L — ABNORMAL HIGH (ref 44–121)
Bilirubin Total: 0.5 mg/dL (ref 0.0–1.2)
Bilirubin, Direct: 0.15 mg/dL (ref 0.00–0.40)
Total Protein: 6.8 g/dL (ref 6.0–8.5)

## 2020-07-31 LAB — TISSUE TRANSGLUTAMINASE ABS,IGG,IGA
Tissue Transglut Ab: <2 U/mL (ref 0–5)
Transglutaminase IgA: <2 U/mL (ref 0–3)

## 2020-08-06 ENCOUNTER — Ambulatory Visit: Payer: No Typology Code available for payment source | Admitting: Gastroenterology

## 2020-08-06 ENCOUNTER — Other Ambulatory Visit (INDEPENDENT_AMBULATORY_CARE_PROVIDER_SITE_OTHER): Payer: No Typology Code available for payment source

## 2020-08-06 ENCOUNTER — Encounter: Payer: Self-pay | Admitting: Gastroenterology

## 2020-08-06 VITALS — BP 100/60 | HR 76 | Ht 66.0 in | Wt 161.0 lb

## 2020-08-06 DIAGNOSIS — K760 Fatty (change of) liver, not elsewhere classified: Secondary | ICD-10-CM

## 2020-08-06 DIAGNOSIS — K900 Celiac disease: Secondary | ICD-10-CM

## 2020-08-06 DIAGNOSIS — R748 Abnormal levels of other serum enzymes: Secondary | ICD-10-CM

## 2020-08-06 LAB — IBC + FERRITIN
Ferritin: 40.6 ng/mL (ref 10.0–291.0)
Iron: 111 ug/dL (ref 42–145)
Saturation Ratios: 32.4 % (ref 20.0–50.0)
Transferrin: 245 mg/dL (ref 212.0–360.0)

## 2020-08-06 LAB — GAMMA GT: GGT: 70 U/L — ABNORMAL HIGH (ref 7–51)
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
## 2020-08-06 NOTE — Patient Instructions (Addendum)
If you are age 26 or older, your body mass index should be between 23-30. Your Body mass index is 25.99 kg/m. If this is out of the aforementioned range listed, please consider follow up with your Primary Care Provider.  If you are age 71 or younger, your body mass index should be between 19-25. Your Body mass index is 25.99 kg/m. If this is out of the aformentioned range listed, please consider follow up with your Primary Care Provider.   __________________________________________________________  The [facility] providers would like to encourage you to use [facility] to communicate with providers for non-urgent requests or questions.  Due to long hold times on the telephone, sending your provider a message by [facility] may be a faster and more efficient way to get a response.  Please allow 48 business hours for a response.  Please remember that this is for non-urgent requests.    Please go to the lab in the basement of our building to have lab work done as you leave today. Hit "B" for basement when you get on the elevator.  When the doors open the lab is on your left.  We will call you with the results. Thank you.  Thank you for entrusting me with your care and for choosing [facility], Dr. [place] Harrison Cellar   Due to recent changes in healthcare laws, you may see the results of your imaging and laboratory studies on MyChart before your provider has had a chance to review them.  We understand that in some cases there may be results that are confusing or concerning to you. Not all laboratory results come back in the same time frame and the provider may be waiting for multiple results in order to interpret others.  Please give [place] 48 hours in order for your provider to thoroughly review all the results before contacting the office for clarification of your results.

## 2020-08-10 LAB — HEPATITIS A ANTIBODY, TOTAL: Hepatitis A AB,Total: REACTIVE — AB

## 2020-08-10 LAB — HEPATITIS B SURFACE ANTIBODY,QUALITATIVE: Hep B S Ab: BORDERLINE — AB

## 2020-08-10 LAB — CERULOPLASMIN: Ceruloplasmin: 30 mg/dL (ref 18–53)

## 2020-08-10 LAB — ALPHA-1-ANTITRYPSIN: A-1 Antitrypsin, Ser: 127 mg/dL (ref 83–199)

## 2020-08-11 ENCOUNTER — Other Ambulatory Visit: Payer: Self-pay

## 2020-08-11 DIAGNOSIS — K760 Fatty (change of) liver, not elsewhere classified: Secondary | ICD-10-CM

## 2020-08-11 DIAGNOSIS — R748 Abnormal levels of other serum enzymes: Secondary | ICD-10-CM

## 2020-08-11 DIAGNOSIS — R1011 Right upper quadrant pain: Secondary | ICD-10-CM

## 2020-08-11 DIAGNOSIS — K900 Celiac disease: Secondary | ICD-10-CM

## 2020-10-06 ENCOUNTER — Ambulatory Visit (AMBULATORY_SURGERY_CENTER): Payer: No Typology Code available for payment source | Admitting: Gastroenterology

## 2020-10-06 ENCOUNTER — Other Ambulatory Visit: Payer: Self-pay

## 2020-10-06 ENCOUNTER — Encounter: Payer: Self-pay | Admitting: Gastroenterology

## 2020-10-06 VITALS — BP 99/61 | HR 63 | Temp 97.5°F | Resp 13 | Ht 66.0 in | Wt 161.0 lb

## 2020-10-06 DIAGNOSIS — K2980 Duodenitis without bleeding: Secondary | ICD-10-CM

## 2020-10-06 DIAGNOSIS — K900 Celiac disease: Secondary | ICD-10-CM

## 2020-10-06 DIAGNOSIS — R748 Abnormal levels of other serum enzymes: Secondary | ICD-10-CM

## 2020-10-06 MED ORDER — SODIUM CHLORIDE 0.9 % IV SOLN: 500.0000 mL | Freq: Once | INTRAVENOUS | Status: DC

## 2020-10-06 NOTE — Progress Notes (Signed)
VS by [facility]  I have reviewed the patient's medical history in detail and updated the computerized patient record.  

## 2020-10-06 NOTE — Progress Notes (Signed)
Report to PACU, RN, vss, BBS= Clear.  

## 2020-10-06 NOTE — Op Note (Signed)
[facility] Patient Name: Maria Cooper Procedure Date: 10/06/2020 9:19 AM MRN: [id number] Endoscopist: Viviann Spare P. Adela Lank , MD Age: 25 Referring MD:  Date of Birth: 1995/12/13 Gender: Female Account #: [id number] Procedure:                Upper GI endoscopy Indications:              Follow-up of celiac disease - dx 2016 with                            endoscopy but serologies negative, however on                            regular diet, TTG remains normal, assess for active                            celiac disease Medicines:                Monitored Anesthesia Care Procedure:                Pre-Anesthesia Assessment:                           - Prior to the procedure, a History and Physical                            was performed, and patient medications and                            allergies were reviewed. The patient's tolerance of                            previous anesthesia was also reviewed. The risks                            and benefits of the procedure and the sedation                            options and risks were discussed with the patient.                            All questions were answered, and informed consent                            was obtained. Prior Anticoagulants: The patient has                            taken no previous anticoagulant or antiplatelet                            agents. ASA Grade Assessment: II - A patient with                            mild systemic disease. After reviewing the risks  and benefits, the patient was deemed in                            satisfactory condition to undergo the procedure.                           After obtaining informed consent, the endoscope was                            passed under direct vision. Throughout the                            procedure, the patient's blood pressure, pulse, and                            oxygen saturations were monitored  continuously. The                            Endoscope was introduced through the mouth, and                            advanced to the second part of duodenum. The upper                            GI endoscopy was accomplished without difficulty.                            The patient tolerated the procedure well. Scope In: Scope Out: Findings:                 Esophagogastric landmarks were identified: the                            Z-line was found at 40 cm, the gastroesophageal                            junction was found at 40 cm and the upper extent of                            the gastric folds was found at 40 cm from the                            incisors.                           The exam of the esophagus was otherwise normal.                           The entire examined stomach was normal.                           The duodenal bulb and second portion of the  duodenum were normal. Biopsies for histology were                            taken with a cold forceps for evaluation of celiac                            disease. Complications:            No immediate complications. Estimated blood loss:                            Minimal. Estimated Blood Loss:     Estimated blood loss was minimal. Impression:               - Esophagogastric landmarks identified.                           - Normal esophagus                           - Normal stomach.                           - Normal duodenal bulb and second portion of the                            duodenum. Biopsied to assess for celiac disease. Recommendation:           - Patient has a contact number available for                            emergencies. The signs and symptoms of potential                            delayed complications were discussed with the                            patient. Return to normal activities tomorrow.                            Written discharge instructions were provided  to the                            patient.                           - Resume previous diet.                           - Continue present medications.                           - Await pathology results.                           - Await results of scheduled MRCP for further  evaluation of abnormal liver enzymes Juri Dinning P. Theo Krumholz, MD 10/06/2020 9:34:58 AM This report has been signed electronically.

## 2020-10-06 NOTE — Patient Instructions (Signed)
Please read handouts provided. Continue present medications. Await pathology results.   YOU HAD AN ENDOSCOPIC PROCEDURE TODAY AT [facility]:   Refer to the procedure report that was given to you for any specific questions about what was found during the examination.  If the procedure report does not answer your questions, please call your gastroenterologist to clarify.  If you requested that your care partner not be given the details of your procedure findings, then the procedure report has been included in a sealed envelope for you to review at your convenience later.  YOU SHOULD EXPECT: Some feelings of bloating in the abdomen. Passage of more gas than usual.  Walking can help get rid of the air that was put into your GI tract during the procedure and reduce the bloating. If you had a lower endoscopy (such as a colonoscopy or flexible sigmoidoscopy) you may notice spotting of blood in your stool or on the toilet paper. If you underwent a bowel prep for your procedure, you may not have a normal bowel movement for a few days.  Please Note:  You might notice some irritation and congestion in your nose or some drainage.  This is from the oxygen used during your procedure.  There is no need for concern and it should clear up in a day or so.  SYMPTOMS TO REPORT IMMEDIATELY:    Following upper endoscopy (EGD)  Vomiting of blood or coffee ground material  New chest pain or pain under the shoulder blades  Painful or persistently difficult swallowing  New shortness of breath  Fever of 100F or higher  Black, tarry-looking stools  For urgent or emergent issues, a gastroenterologist can be reached at any hour by calling [phone]. Do not use MyChart messaging for urgent concerns.    DIET:  We do recommend a small meal at first, but then you may proceed to your regular diet.  Drink plenty of fluids but you should avoid alcoholic beverages for 24 hours.  ACTIVITY:  You should  plan to take it easy for the rest of today and you should NOT DRIVE or use heavy machinery until tomorrow (because of the sedation medicines used during the test).    FOLLOW UP: Our staff will call the number listed on your records 48-72 hours following your procedure to check on you and address any questions or concerns that you may have regarding the information given to you following your procedure. If we do not reach you, we will leave a message.  We will attempt to reach you two times.  During this call, we will ask if you have developed any symptoms of COVID 19. If you develop any symptoms (ie: fever, flu-like symptoms, shortness of breath, cough etc.) before then, please call [phone].  If you test positive for Covid 19 in the 2 weeks post procedure, please call and report this information to [place].    If any biopsies were taken you will be contacted by phone or by letter within the next 1-3 weeks.  Please call us at [phone] if you have not heard about the biopsies in 3 weeks.    SIGNATURES/CONFIDENTIALITY: You and/or your care partner have signed paperwork which will be entered into your electronic medical record.  These signatures attest to the fact that that the information above on your After Visit Summary has been reviewed and is understood.  Full responsibility of the confidentiality of this discharge information lies with you and/or your care-partner.

## 2020-10-06 NOTE — Progress Notes (Signed)
Called to room to assist during endoscopic procedure.  Patient ID and intended procedure confirmed with present staff. Received instructions for my participation in the procedure from the performing physician.  

## 2020-10-08 ENCOUNTER — Telehealth: Payer: Self-pay

## 2020-10-08 NOTE — Telephone Encounter (Signed)
Attempted f/u call back. No answer, left VM.

## 2020-10-08 NOTE — Telephone Encounter (Signed)
  Follow up Call-  Call back number 10/06/2020  Post procedure Call Back phone  # [phone]  Permission to leave phone message Yes  Some recent data might be hidden     Patient questions:  Do you have a fever, pain , or abdominal swelling? No. Pain Score  0 *  Have you tolerated food without any problems? Yes.    Have you been able to return to your normal activities? Yes.    Do you have any questions about your discharge instructions: Diet   No. Medications  No. Follow up visit  No.  Do you have questions or concerns about your Care? No.  Actions: * If pain score is 4 or above: No action needed, pain <4.

## 2020-10-20 ENCOUNTER — Other Ambulatory Visit: Payer: Self-pay | Admitting: Gastroenterology

## 2020-10-20 ENCOUNTER — Other Ambulatory Visit: Payer: Self-pay

## 2020-10-20 ENCOUNTER — Ambulatory Visit (HOSPITAL_COMMUNITY)
Admission: RE | Admit: 2020-10-20 | Discharge: 2020-10-20 | Disposition: A | Discharge status: Home / Self Care | Payer: No Typology Code available for payment source | Source: Ambulatory Visit | Attending: Gastroenterology | Admitting: Gastroenterology

## 2020-10-20 DIAGNOSIS — R1011 Right upper quadrant pain: Secondary | ICD-10-CM | POA: Diagnosis present

## 2020-10-20 DIAGNOSIS — R748 Abnormal levels of other serum enzymes: Secondary | ICD-10-CM | POA: Insufficient documentation

## 2020-10-20 DIAGNOSIS — K760 Fatty (change of) liver, not elsewhere classified: Secondary | ICD-10-CM | POA: Diagnosis present

## 2020-10-20 DIAGNOSIS — K900 Celiac disease: Secondary | ICD-10-CM | POA: Diagnosis present

## 2020-10-20 MED ORDER — GADOBUTROL 1 MMOL/ML IV SOLN
7.0000 mL | Freq: Once | INTRAVENOUS | Status: AC | PRN
  Administered 2020-10-20: 7 mL via INTRAVENOUS

## 2020-10-22 ENCOUNTER — Other Ambulatory Visit: Payer: Self-pay

## 2020-10-22 DIAGNOSIS — R748 Abnormal levels of other serum enzymes: Secondary | ICD-10-CM

## 2020-10-22 DIAGNOSIS — K760 Fatty (change of) liver, not elsewhere classified: Secondary | ICD-10-CM

## 2020-10-26 ENCOUNTER — Other Ambulatory Visit: Payer: No Typology Code available for payment source

## 2020-10-26 DIAGNOSIS — R748 Abnormal levels of other serum enzymes: Secondary | ICD-10-CM

## 2020-10-26 DIAGNOSIS — K760 Fatty (change of) liver, not elsewhere classified: Secondary | ICD-10-CM

## 2020-10-26 LAB — HEPATIC FUNCTION PANEL
ALT: 35 U/L (ref 0–35)
AST: 26 U/L (ref 0–37)
Albumin: 4.6 g/dL (ref 3.5–5.2)
Alkaline Phosphatase: 153 U/L — ABNORMAL HIGH (ref 39–117)
Bilirubin, Direct: 0.1 mg/dL (ref 0.0–0.3)
Total Bilirubin: 0.4 mg/dL (ref 0.2–1.2)
Total Protein: 7.2 g/dL (ref 6.0–8.3)

## 2020-12-21 ENCOUNTER — Other Ambulatory Visit (INDEPENDENT_AMBULATORY_CARE_PROVIDER_SITE_OTHER): Payer: No Typology Code available for payment source

## 2020-12-21 ENCOUNTER — Ambulatory Visit: Payer: No Typology Code available for payment source | Admitting: Gastroenterology

## 2020-12-21 ENCOUNTER — Encounter: Payer: Self-pay | Admitting: Gastroenterology

## 2020-12-21 VITALS — BP 120/60 | HR 100 | Ht 66.0 in | Wt 160.4 lb

## 2020-12-21 DIAGNOSIS — R748 Abnormal levels of other serum enzymes: Secondary | ICD-10-CM

## 2020-12-21 LAB — HEPATIC FUNCTION PANEL
ALT: 30 U/L (ref 0–35)
AST: 22 U/L (ref 0–37)
Albumin: 4.5 g/dL (ref 3.5–5.2)
Alkaline Phosphatase: 147 U/L — ABNORMAL HIGH (ref 39–117)
Bilirubin, Direct: 0.1 mg/dL (ref 0.0–0.3)
Total Bilirubin: 0.4 mg/dL (ref 0.2–1.2)
Total Protein: 7 g/dL (ref 6.0–8.3)
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
## 2020-12-21 NOTE — Patient Instructions (Addendum)
If you are age 26 or older, your body mass index should be between 23-30. Your Body mass index is 25.89 kg/m. If this is out of the aforementioned range listed, please consider follow up with your Primary Care Provider.  If you are age 82 or younger, your body mass index should be between 19-25. Your Body mass index is 25.89 kg/m. If this is out of the aformentioned range listed, please consider follow up with your Primary Care Provider.   __________________________________________________________  The [facility] providers would like to encourage you to use [facility] to communicate with providers for non-urgent requests or questions.  Due to long hold times on the telephone, sending your provider a message by [facility] may be a faster and more efficient way to get a response.  Please allow 48 business hours for a response.  Please remember that this is for non-urgent requests.    Please go to the lab in the basement of our building to have lab work done as you leave today. Hit "B" for basement when you get on the elevator.  When the doors open the lab is on your left.  We will call you with the results. Thank you.   Thank you for entrusting me with your care and for choosing [facility], Dr. Carey Cellar

## 2020-12-22 LAB — IGG: IgG (Immunoglobin G), Serum: 950 mg/dL (ref 600–1640)

## 2021-04-04 ENCOUNTER — Other Ambulatory Visit (INDEPENDENT_AMBULATORY_CARE_PROVIDER_SITE_OTHER): Payer: No Typology Code available for payment source

## 2021-04-04 ENCOUNTER — Other Ambulatory Visit: Payer: Self-pay

## 2021-04-04 DIAGNOSIS — R748 Abnormal levels of other serum enzymes: Secondary | ICD-10-CM

## 2021-04-04 LAB — HEPATIC FUNCTION PANEL
ALT: 17 U/L (ref 0–35)
AST: 18 U/L (ref 0–37)
Albumin: 4.7 g/dL (ref 3.5–5.2)
Alkaline Phosphatase: 97 U/L (ref 39–117)
Bilirubin, Direct: 0.1 mg/dL (ref 0.0–0.3)
Total Bilirubin: 0.5 mg/dL (ref 0.2–1.2)
Total Protein: 7.1 g/dL (ref 6.0–8.3)

## 2021-04-04 NOTE — Progress Notes (Signed)
Due for LFTs

## 2021-04-04 NOTE — Progress Notes (Signed)
Orders for LFTs in July

## 2021-04-26 ENCOUNTER — Other Ambulatory Visit: Payer: Self-pay

## 2021-04-26 ENCOUNTER — Ambulatory Visit
Admission: EM | Admit: 2021-04-26 | Discharge: 2021-04-26 | Disposition: A | Discharge status: Home / Self Care | Payer: No Typology Code available for payment source | Attending: Student | Admitting: Student

## 2021-04-26 DIAGNOSIS — S0501XA Injury of conjunctiva and corneal abrasion without foreign body, right eye, initial encounter: Secondary | ICD-10-CM | POA: Diagnosis not present

## 2021-04-26 MED ORDER — POLYMYXIN B-TRIMETHOPRIM 10000-0.1 UNIT/ML-% OP SOLN: 1.0000 [drp] | OPHTHALMIC | 0 refills | Status: AC
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
## 2021-04-26 NOTE — ED Triage Notes (Signed)
Pt reports she has pain and burning sensation in the right eye after her daughter hit the right eye with a toy today around 12:30.

## 2021-04-26 NOTE — Discharge Instructions (Addendum)
-  You have a small scratch on your eye. This is called a corneal abrasion. We use antibiotic drops to prevent infection. -Use the Polytrim drops.  Put 1 drop in the affected eye every 4 hours while awake for 7 days.   -You can also use warm compresses 1-2 times daily. -If your symptoms get worse instead of better, follow-up immediately with [place], or an ophthalmologist or other eye doctor.  This includes vision changes, eyelid swelling, pain with movement, worsening of redness despite treatment.

## 2021-08-09 ENCOUNTER — Ambulatory Visit
Admission: EM | Admit: 2021-08-09 | Discharge: 2021-08-09 | Disposition: A | Discharge status: Home / Self Care | Payer: No Typology Code available for payment source | Attending: Family Medicine | Admitting: Family Medicine

## 2021-08-09 DIAGNOSIS — R112 Nausea with vomiting, unspecified: Secondary | ICD-10-CM | POA: Diagnosis not present

## 2021-08-09 DIAGNOSIS — L0100 Impetigo, unspecified: Secondary | ICD-10-CM

## 2021-08-09 DIAGNOSIS — Z20828 Contact with and (suspected) exposure to other viral communicable diseases: Secondary | ICD-10-CM | POA: Diagnosis not present

## 2021-08-09 MED ORDER — MUPIROCIN 2 % EX OINT: 1.0000 "application " | TOPICAL_OINTMENT | Freq: Two times a day (BID) | CUTANEOUS | 0 refills | Status: DC

## 2021-08-09 MED ORDER — ONDANSETRON 4 MG PO TBDP: 4.0000 mg | ORAL_TABLET | Freq: Three times a day (TID) | ORAL | 0 refills | Status: DC | PRN

## 2021-08-09 NOTE — ED Triage Notes (Signed)
Pt states she has a rash on her face that she can not get rid of for the past 2 weeks  Pt states she started vomiting and nausea for 1 day  Pt states she is having muscle aches and fatigue  Denies Fever
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
## 2021-08-10 LAB — COVID-19, FLU A+B NAA
Influenza A, NAA: NOT DETECTED
Influenza B, NAA: NOT DETECTED
SARS-CoV-2, NAA: NOT DETECTED

## 2021-09-28 ENCOUNTER — Telehealth: Payer: Self-pay

## 2021-09-28 NOTE — Telephone Encounter (Signed)
Called and spoke to patient. She indicated she will go to the lab one day this week or early next. Order is in

## 2021-09-28 NOTE — Telephone Encounter (Signed)
-----   Message from Roetta Sessions, [place] sent at 04/04/2021  7:59 PM EST ----- Regarding: LFTs due Patient will be due for LFTs around 7-23

## 2021-10-13 ENCOUNTER — Other Ambulatory Visit (INDEPENDENT_AMBULATORY_CARE_PROVIDER_SITE_OTHER): Payer: No Typology Code available for payment source

## 2021-10-13 DIAGNOSIS — R748 Abnormal levels of other serum enzymes: Secondary | ICD-10-CM

## 2021-10-13 LAB — HEPATIC FUNCTION PANEL
ALT: 35 U/L (ref 0–35)
AST: 28 U/L (ref 0–37)
Albumin: 4.6 g/dL (ref 3.5–5.2)
Alkaline Phosphatase: 99 U/L (ref 39–117)
Bilirubin, Direct: 0.1 mg/dL (ref 0.0–0.3)
Total Bilirubin: 0.6 mg/dL (ref 0.2–1.2)
Total Protein: 7.1 g/dL (ref 6.0–8.3)

## 2021-11-10 IMAGING — US US ABDOMEN LIMITED
1 series · 15 of 25 positions shown · non-contrast
Comparison: None.

CLINICAL DATA: Right upper quadrant pain with elevated liver
function tests.

EXAM:
ULTRASOUND ABDOMEN LIMITED RIGHT UPPER QUADRANT

[Series 1: us abdomen limited ruq mc & wl · 15 of 59 slices shown]
[im 1/59]
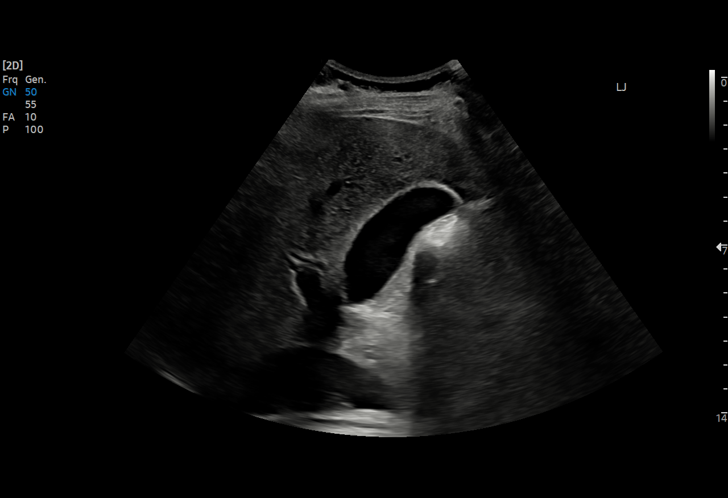
[im 5/59]
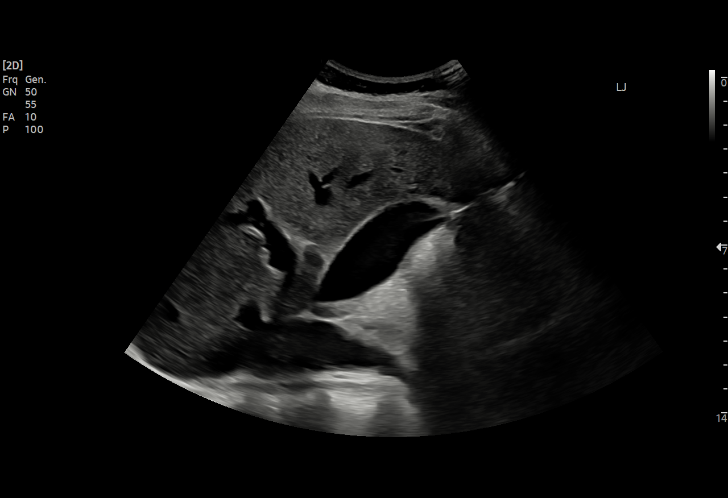
[im 10/59]
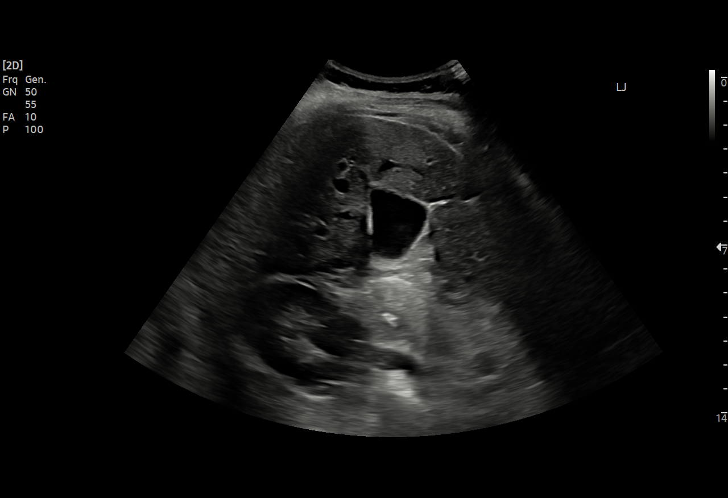
[im 13/59]
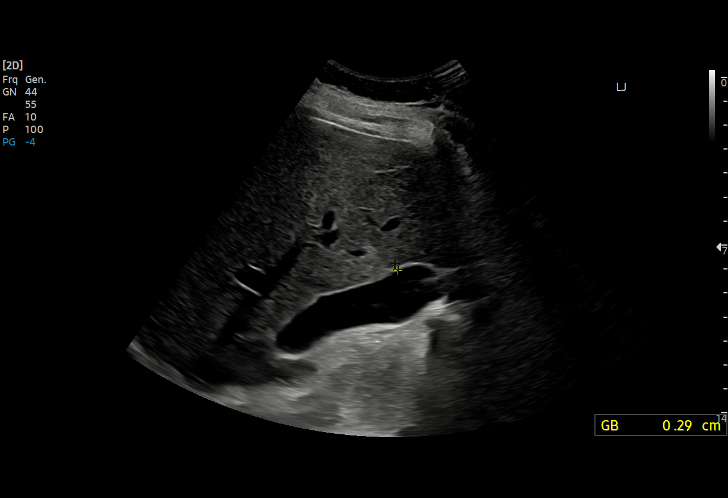
[im 17/59]
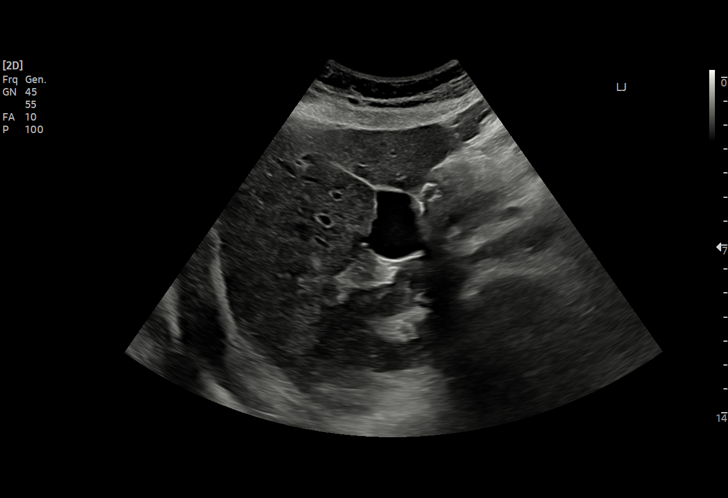
[im 22/59]
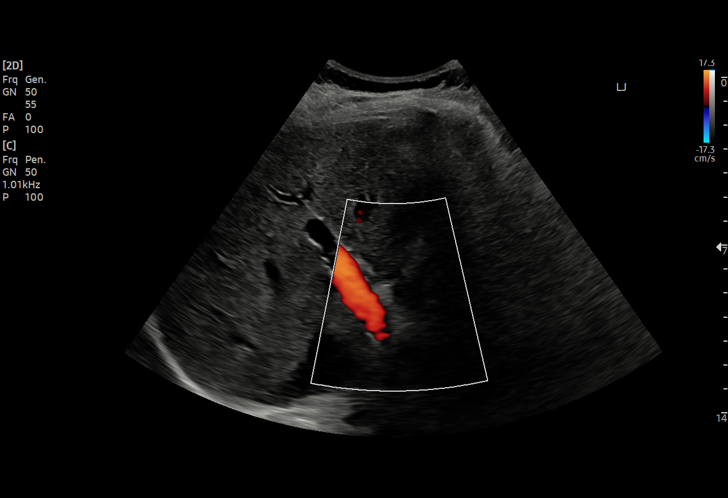
[im 25/59]
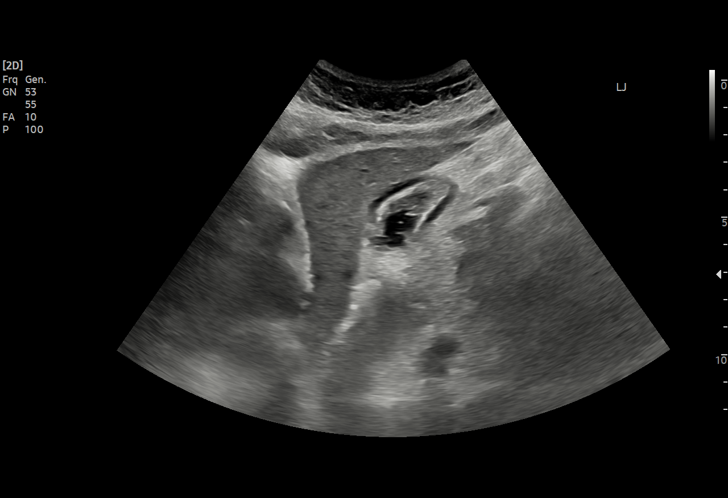
[im 30/59]
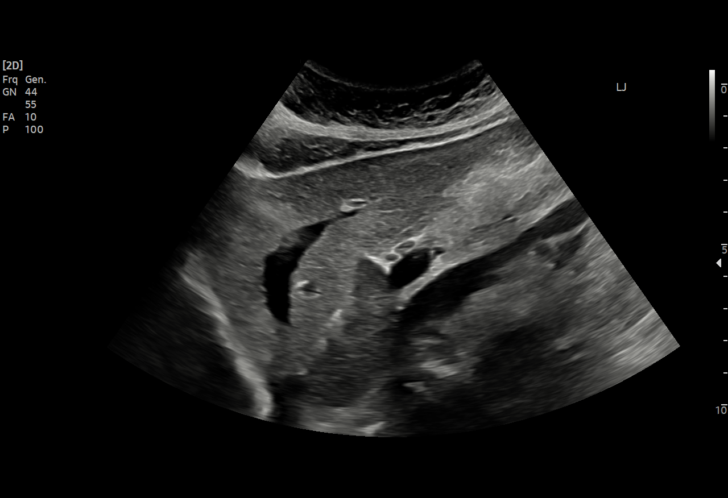
[im 34/59]
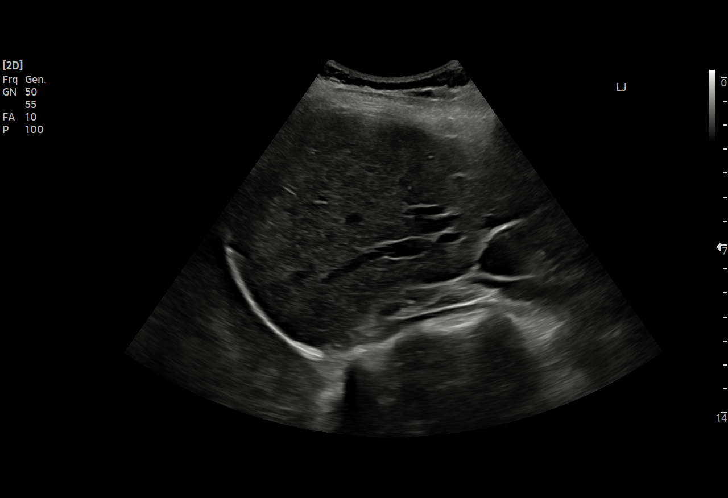
[im 37/59]
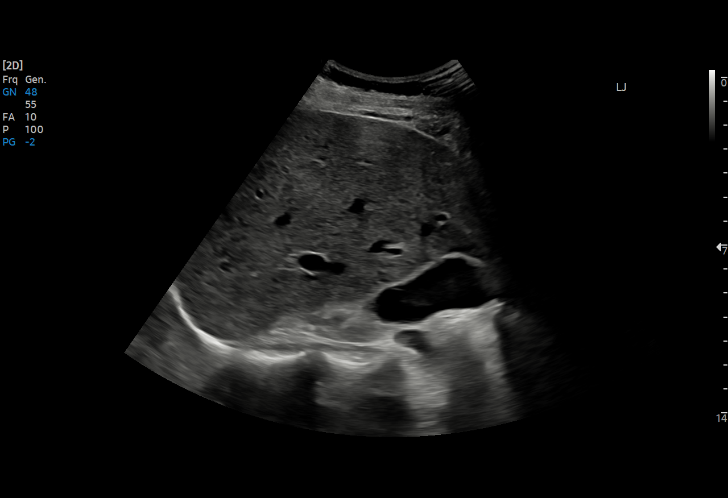
[im 42/59]
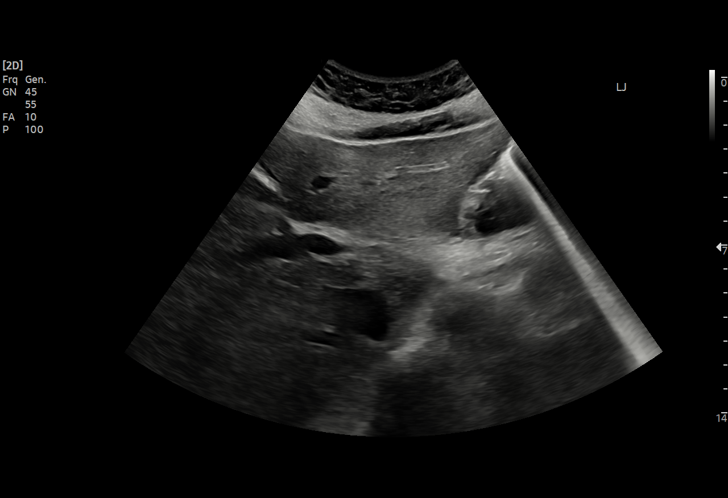
[im 46/59]
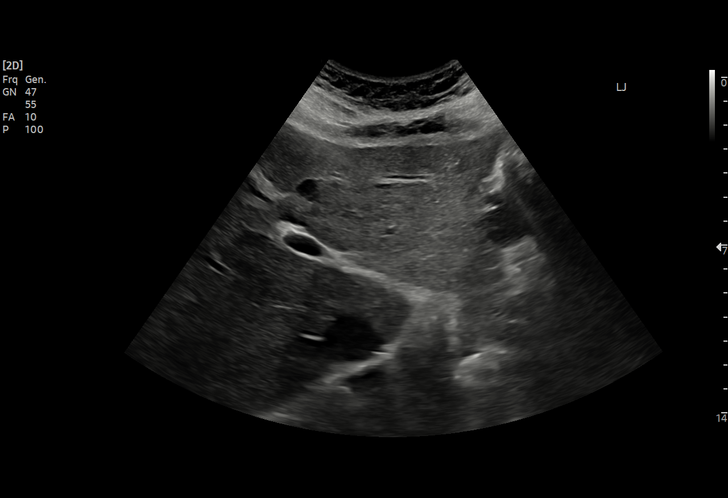
[im 49/59]
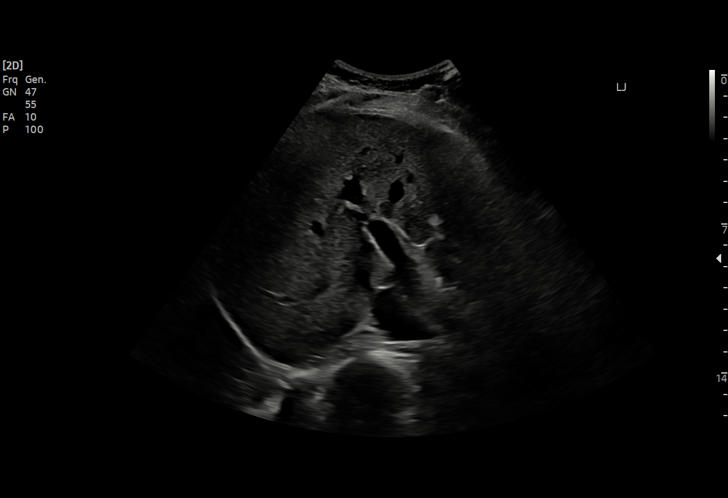
[im 54/59]
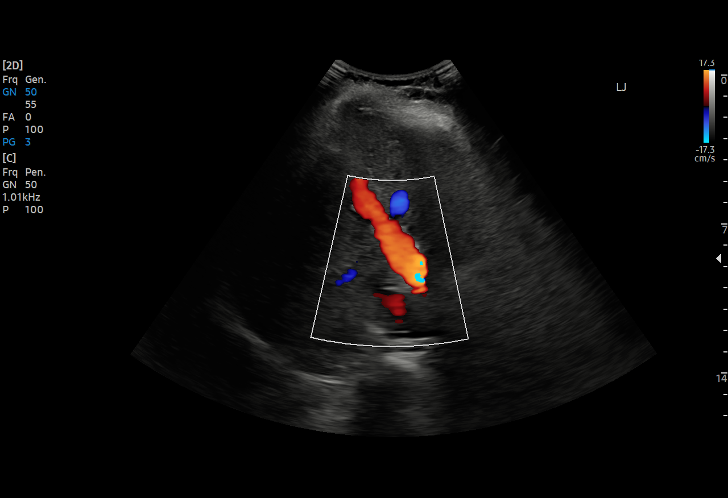
[im 59/59]
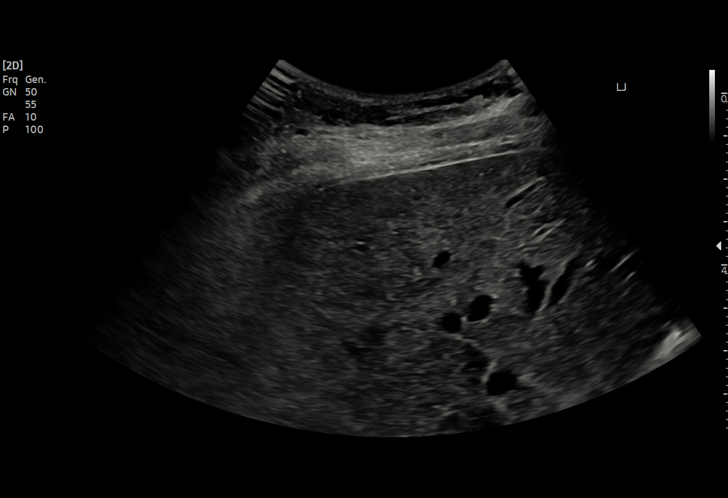

[15 of 25 positions shown; findings below may reference images not displayed]

FINDINGS: Gallbladder:

No gallstones or wall thickening visualized (3.0 mm). No sonographic
Murphy sign noted by sonographer.

Common bile duct:

Diameter: 3.0 mm

Liver:

No focal lesion identified. Mildly increased echogenicity of the
liver parenchyma is noted. Portal vein is patent on color Doppler
imaging with normal direction of blood flow towards the liver.

Other: None.
IMPRESSION: Fatty liver.

## 2022-02-20 DIAGNOSIS — D2271 Melanocytic nevi of right lower limb, including hip: Secondary | ICD-10-CM | POA: Diagnosis not present

## 2022-02-20 DIAGNOSIS — D2272 Melanocytic nevi of left lower limb, including hip: Secondary | ICD-10-CM | POA: Diagnosis not present

## 2022-02-20 DIAGNOSIS — D2261 Melanocytic nevi of right upper limb, including shoulder: Secondary | ICD-10-CM | POA: Diagnosis not present

## 2022-02-20 DIAGNOSIS — D225 Melanocytic nevi of trunk: Secondary | ICD-10-CM | POA: Diagnosis not present

## 2022-02-20 DIAGNOSIS — D485 Neoplasm of uncertain behavior of skin: Secondary | ICD-10-CM | POA: Diagnosis not present

## 2022-03-12 ENCOUNTER — Emergency Department (HOSPITAL_COMMUNITY)
Admission: EM | Admit: 2022-03-12 | Discharge: 2022-03-12 | Disposition: A | Discharge status: Home / Self Care | Payer: BC Managed Care – PPO | Attending: Emergency Medicine | Admitting: Emergency Medicine

## 2022-03-12 ENCOUNTER — Emergency Department (HOSPITAL_COMMUNITY): Payer: BC Managed Care – PPO

## 2022-03-12 ENCOUNTER — Other Ambulatory Visit: Payer: Self-pay

## 2022-03-12 ENCOUNTER — Encounter (HOSPITAL_COMMUNITY): Payer: Self-pay | Admitting: Emergency Medicine

## 2022-03-12 DIAGNOSIS — J45909 Unspecified asthma, uncomplicated: Secondary | ICD-10-CM | POA: Diagnosis not present

## 2022-03-12 DIAGNOSIS — R918 Other nonspecific abnormal finding of lung field: Secondary | ICD-10-CM | POA: Diagnosis not present

## 2022-03-12 DIAGNOSIS — Z1152 Encounter for screening for COVID-19: Secondary | ICD-10-CM | POA: Diagnosis not present

## 2022-03-12 DIAGNOSIS — E876 Hypokalemia: Secondary | ICD-10-CM | POA: Insufficient documentation

## 2022-03-12 DIAGNOSIS — R0602 Shortness of breath: Secondary | ICD-10-CM | POA: Insufficient documentation

## 2022-03-12 DIAGNOSIS — Z0389 Encounter for observation for other suspected diseases and conditions ruled out: Secondary | ICD-10-CM | POA: Diagnosis not present

## 2022-03-12 DIAGNOSIS — R0789 Other chest pain: Secondary | ICD-10-CM | POA: Diagnosis not present

## 2022-03-12 DIAGNOSIS — R079 Chest pain, unspecified: Secondary | ICD-10-CM

## 2022-03-12 HISTORY — DX: Unspecified asthma, uncomplicated: J45.909

## 2022-03-12 LAB — COMPREHENSIVE METABOLIC PANEL WITH GFR
ALT: 23 U/L (ref 0–44)
AST: 31 U/L (ref 15–41)
Albumin: 4.5 g/dL (ref 3.5–5.0)
Alkaline Phosphatase: 84 U/L (ref 38–126)
Anion gap: 10 (ref 5–15)
BUN: 15 mg/dL (ref 6–20)
CO2: 21 mmol/L — ABNORMAL LOW (ref 22–32)
Calcium: 9.5 mg/dL (ref 8.9–10.3)
Chloride: 109 mmol/L (ref 98–111)
Creatinine, Ser: 0.79 mg/dL (ref 0.44–1.00)
GFR, Estimated: >60 mL/min
Glucose, Bld: 126 mg/dL — ABNORMAL HIGH (ref 70–99)
Potassium: 2.9 mmol/L — ABNORMAL LOW (ref 3.5–5.1)
Sodium: 140 mmol/L (ref 135–145)
Total Bilirubin: 1 mg/dL (ref 0.3–1.2)
Total Protein: 7.2 g/dL (ref 6.5–8.1)

## 2022-03-12 LAB — CBC
HCT: 39.4 % (ref 36.0–46.0)
Hemoglobin: 13.6 g/dL (ref 12.0–15.0)
MCH: 29.4 pg (ref 26.0–34.0)
MCHC: 34.5 g/dL (ref 30.0–36.0)
MCV: 85.1 fL (ref 80.0–100.0)
Platelets: 162 10*3/uL (ref 150–400)
RBC: 4.63 MIL/uL (ref 3.87–5.11)
RDW: 12.5 % (ref 11.5–15.5)
WBC: 4.7 10*3/uL (ref 4.0–10.5)
nRBC: 0 % (ref 0.0–0.2)

## 2022-03-12 LAB — RESP PANEL BY RT-PCR (RSV, FLU A&B, COVID)  RVPGX2
Influenza A by PCR: NEGATIVE
Influenza B by PCR: NEGATIVE
Resp Syncytial Virus by PCR: NEGATIVE
SARS Coronavirus 2 by RT PCR: NEGATIVE

## 2022-03-12 LAB — BRAIN NATRIURETIC PEPTIDE: B Natriuretic Peptide: 26 pg/mL (ref 0.0–100.0)

## 2022-03-12 LAB — TROPONIN I (HIGH SENSITIVITY)
Troponin I (High Sensitivity): 2 ng/L
Troponin I (High Sensitivity): <2 ng/L

## 2022-03-12 LAB — POC URINE PREG, ED: Preg Test, Ur: NEGATIVE

## 2022-03-12 MED ORDER — POTASSIUM CHLORIDE 10 MEQ/100ML IV SOLN
10.0000 meq | Freq: Once | INTRAVENOUS | Status: AC
  Administered 2022-03-12: 10 meq via INTRAVENOUS
  Filled 2022-03-12: qty 100

## 2022-03-12 MED ORDER — IOHEXOL 350 MG/ML SOLN
75.0000 mL | Freq: Once | INTRAVENOUS | Status: AC | PRN
  Administered 2022-03-12: 75 mL via INTRAVENOUS

## 2022-03-12 MED ORDER — POTASSIUM CHLORIDE CRYS ER 20 MEQ PO TBCR: 20.0000 meq | EXTENDED_RELEASE_TABLET | Freq: Two times a day (BID) | ORAL | 0 refills | Status: DC

## 2022-03-12 MED ORDER — POTASSIUM CHLORIDE CRYS ER 20 MEQ PO TBCR
60.0000 meq | EXTENDED_RELEASE_TABLET | Freq: Once | ORAL | Status: AC
  Administered 2022-03-12: 60 meq via ORAL
  Filled 2022-03-12: qty 3

## 2022-03-12 NOTE — ED Provider Notes (Signed)
[facility] EMERGENCY DEPARTMENT Provider Note   CSN: [id number] Arrival date & time: 03/12/22  7494     History  Chief Complaint  Patient presents with   Chest Pain   Shortness of Breath    Maria Cooper is a 26 y.o. female.  26 year old female who presents to the emergency department with chest pain and shortness of breath.  States that last week she started having exertional and positional chest pain.  Describes it as substernal and a tightness.  Does not think it is pleuritic.  Has also had associated dyspnea on exertion that is significant.  Has been having occasional episodes of presyncope.  Went to work today and reports that symptoms got worse so tried albuterol inhaler without improvement and came to the emergency department for evaluation.  Only history of asthma was as a child.  No fevers, cough, lower extremity swelling, history of DVT or PE, history of MI.  No recent surgeries.  Mother was sick with [place] recently.  No immediate family history of MI.       Home Medications Prior to Admission medications   Medication Sig Start Date End Date Taking? Authorizing Provider  potassium chloride SA (KLOR-CON M) 20 MEQ tablet Take 1 tablet (20 mEq total) by mouth 2 (two) times daily for 4 days. 03/12/22 03/16/22 Yes Fransico Meadow, MD  mupirocin ointment (BACTROBAN) 2 % Apply 1 application. topically 2 (two) times daily. 08/09/21   Volney American, PA-C  ondansetron (ZOFRAN-ODT) 4 MG disintegrating tablet Take 1 tablet (4 mg total) by mouth every 8 (eight) hours as needed for nausea or vomiting. 08/09/21   Volney American, PA-C      Allergies    Citrus and Nyquil multi-symptom [pseudoeph-doxylamine-dm-apap]    Review of Systems   Review of Systems  Physical Exam Updated Vital Signs BP 94/61   Pulse (!) 56   Temp 98.6 F (37 C)   Resp 17   Ht 5' 6"  (1.676 m)   Wt 68.9 kg   LMP 03/12/2022   SpO2 100%   BMI 24.53 kg/m  Physical Exam Vitals and  nursing note reviewed.  Constitutional:      General: She is not in acute distress.    Appearance: She is well-developed.     Comments: Obviously dyspneic with talking and moving in the stretcher  HENT:     Head: Normocephalic and atraumatic.     Right Ear: External ear normal.     Left Ear: External ear normal.     Nose: Nose normal.  Eyes:     Extraocular Movements: Extraocular movements intact.     Conjunctiva/sclera: Conjunctivae normal.     Pupils: Pupils are equal, round, and reactive to light.  Cardiovascular:     Rate and Rhythm: Regular rhythm. Tachycardia present.     Heart sounds: No murmur heard. Pulmonary:     Effort: Pulmonary effort is normal. No respiratory distress.     Breath sounds: Normal breath sounds.  Musculoskeletal:     Cervical back: Normal range of motion and neck supple.     Right lower leg: No edema.     Left lower leg: No edema.  Skin:    General: Skin is warm and dry.  Neurological:     Mental Status: She is alert and oriented to person, place, and time. Mental status is at baseline.  Psychiatric:        Mood and Affect: Mood normal.     ED  Results / Procedures / Treatments   Labs (all labs ordered are listed, but only abnormal results are displayed) Labs Reviewed  COMPREHENSIVE METABOLIC PANEL - Abnormal; Notable for the following components:      Result Value   Potassium 2.9 (*)    CO2 21 (*)    Glucose, Bld 126 (*)    All other components within normal limits  RESP PANEL BY RT-PCR (RSV, FLU A&B, COVID)  RVPGX2  CBC  BRAIN NATRIURETIC PEPTIDE  POC URINE PREG, ED  TROPONIN I (HIGH SENSITIVITY)  TROPONIN I (HIGH SENSITIVITY)    EKG EKG Interpretation  Date/Time:  Sunday March 12 2022 08:31:37 EST Ventricular Rate:  109 PR Interval:    QRS Duration: 88 QT Interval:  315 QTC Calculation: 425 R Axis:   90 Text Interpretation: Sinus arrhythmia Borderline right axis deviation Borderline ST depression, diffuse leads Confirmed by  Margaretmary Eddy [phone]) on 03/12/2022 8:36:18 AM  Radiology CT Angio Chest PE W and/or Wo Contrast  Result Date: 03/12/2022 CLINICAL DATA:  Pulmonary embolism (PE) suspected, high prob EXAM: CT ANGIOGRAPHY CHEST WITH CONTRAST TECHNIQUE: Multidetector CT imaging of the chest was performed using the standard protocol during bolus administration of intravenous contrast. Multiplanar CT image reconstructions and MIPs were obtained to evaluate the vascular anatomy. RADIATION DOSE REDUCTION: This exam was performed according to the departmental dose-optimization program which includes automated exposure control, adjustment of the mA and/or kV according to patient size and/or use of iterative reconstruction technique. CONTRAST:  75m OMNIPAQUE IOHEXOL 350 MG/ML SOLN COMPARISON:  X-ray 11/05/2019 FINDINGS: Cardiovascular: Satisfactory opacification of the pulmonary arteries to the segmental level. No evidence of pulmonary embolism. Thoracic aorta is normal in course and caliber. Normal heart size. No pericardial effusion. Mediastinum/Nodes: No enlarged mediastinal, hilar, or axillary lymph nodes. Thyroid gland, trachea, and esophagus demonstrate no significant findings. Lungs/Pleura: Lungs are clear. No pleural effusion or pneumothorax. Upper Abdomen: No acute abnormality. Musculoskeletal: No chest wall abnormality. No acute or significant osseous findings. Review of the MIP images confirms the above findings. IMPRESSION: No evidence of pulmonary embolism or other acute intrathoracic process. Electronically Signed   By: NDavina PokeD.O.   On: 03/12/2022 10:09    Procedures Procedures    EMERGENCY DEPARTMENT [place] EXAM "Study: Limited Ultrasound of the Heart and Pericardium"  INDICATIONS:Chest pain and Dyspnea Multiple views of the heart and pericardium were obtained in real-time with a multi-frequency probe.  PERFORMED BKZ:SWFUXNIMAGES ARCHIVED?: No LIMITATIONS:   Poor cardiac windows VIEWS  USED: Parasternal long axis INTERPRETATION:  No pericardial effusion with grossly normal EF and RV size  Medications Ordered in ED Medications  potassium chloride 10 mEq in 100 mL IVPB (0 mEq Intravenous Stopped 03/12/22 1039)  potassium chloride SA (KLOR-CON M) CR tablet 60 mEq (60 mEq Oral Given 03/12/22 0934)  iohexol (OMNIPAQUE) 350 MG/ML injection 75 mL (75 mLs Intravenous Contrast Given 03/12/22 02355    ED Course/ Medical Decision Making/ A&P Clinical Course as of 03/12/22 2029  Sun Mar 12, 2022  1213 Walking pulse ox at 100% [RP]    Clinical Course User Index [RP] PFransico Meadow MD                           Medical Decision Making Amount and/or Complexity of Data Reviewed Labs: ordered. Radiology: ordered.  Risk Prescription drug management.   JANGELIKA JERRETTis a 26y.o. female with comorbidities that complicate the patient evaluation including  chest pain and shortness of breath  Initial Ddx:  MI, PE, myocarditis, pericarditis, URI/pleurisy, asthma  MDM:  Initially concern about pulmonary embolism given the patient's tachycardia, chest pain, and shortness of breath.  Also considered MI but feel this is likely less likely given the patient's lack of risk factors.  Pain could also represent pericarditis but feel this is less likely given effusion on ultrasound.  If all is negative may be due to URI or pleurisy however patient does not have congestion or cough that would suggest this.  Patient without wheezing so feel that asthma exacerbation less likely.  Plan:  Labs COVID/flu Troponin CTA chest EKG  ED Summary/Re-evaluation:  Patient reassessed and had some persistent tachycardia.  Of note patient's heart rate would oftentimes elevate when providers would walk in the room suggesting a possible component of anxiety.  Patient serial troponins were WNL.  CTA of the chest did not show evidence of pulmonary embolism.  COVID and flu were negative.  Patient had a  walking pulse ox that was WNL.  At this time unclear what is causing the patient's symptoms but feel that she is suitable for outpatient follow-up.  Will have her follow-up with her primary doctor as well as pulmonology.  Was found to be hypokalemic and given a short course of potassium to take at home.  This patient presents to the ED for concern of complaints listed in HPI, this involves an extensive number of treatment options, and is a complaint that carries with it a high risk of complications and morbidity. Disposition including potential need for admission considered.   Dispo: DC Home. Return precautions discussed including, but not limited to, those listed in the AVS. Allowed pt time to ask questions which were answered fully prior to dc.  Records reviewed Outpatient Clinic Notes The following labs were independently interpreted: Serial Troponins and show no acute abnormality I independently reviewed the following imaging with scope of interpretation limited to determining acute life threatening conditions related to emergency care:  CTA chest  and agree with the radiologist interpretation with the following exceptions: None I personally reviewed and interpreted cardiac monitoring: normal sinus rhythm  and sinus tachycardia I personally reviewed and interpreted the pt's EKG: see above for interpretation  I have reviewed the patients home medications and made adjustments as needed  Final Clinical Impression(s) / ED Diagnoses Final diagnoses:  Hypokalemia  Shortness of breath  Chest pain, unspecified type    Rx / DC Orders ED Discharge Orders          Ordered    potassium chloride SA (KLOR-CON M) 20 MEQ tablet  2 times daily        03/12/22 1304              Fransico Meadow, MD 03/12/22 2029

## 2022-03-12 NOTE — ED Notes (Signed)
Pt stayed at 100% while walking with a pulse ox.

## 2022-03-12 NOTE — ED Notes (Signed)
ED Provider at bedside. 

## 2022-03-12 NOTE — Discharge Instructions (Signed)
You were seen for your chest pain and shortness of breath in the emergency department.  You had an evaluation to look for heart attacks, blood clots in your lungs, and inflammation around your heart which was normal.  At home, please take Tylenol and ibuprofen as needed for your pain.  Your potassium was found to be low so please take the potassium we have prescribed you for several days.    Follow-up with your primary doctor in 2-3 days regarding your visit.  If your symptoms persist call pulmonology to schedule an appointment to discuss your shortness of breath.  Return immediately to the emergency department if you experience any of the following: Worsening breathing, chest pain, or any other concerning symptoms.    Thank you for visiting our Emergency Department. It was a pleasure taking care of you today.

## 2022-03-12 NOTE — ED Triage Notes (Signed)
Patient arrives ambulatory by POV c/o chest pain/ tightness onset of yesterday. Reports last week thinking she was getting sick due to intermittent chest pain and shortness of breath. States mother recently had covid.

## 2022-03-14 DIAGNOSIS — R0602 Shortness of breath: Secondary | ICD-10-CM | POA: Diagnosis not present

## 2022-03-14 DIAGNOSIS — R002 Palpitations: Secondary | ICD-10-CM | POA: Diagnosis not present

## 2022-03-14 DIAGNOSIS — E876 Hypokalemia: Secondary | ICD-10-CM | POA: Diagnosis not present

## 2022-03-16 ENCOUNTER — Emergency Department: Payer: BC Managed Care – PPO

## 2022-03-16 ENCOUNTER — Emergency Department
Admission: EM | Admit: 2022-03-16 | Discharge: 2022-03-16 | Disposition: A | Discharge status: Home / Self Care | Payer: BC Managed Care – PPO | Attending: Emergency Medicine | Admitting: Emergency Medicine

## 2022-03-16 ENCOUNTER — Other Ambulatory Visit: Payer: Self-pay

## 2022-03-16 DIAGNOSIS — R002 Palpitations: Secondary | ICD-10-CM | POA: Insufficient documentation

## 2022-03-16 DIAGNOSIS — R42 Dizziness and giddiness: Secondary | ICD-10-CM | POA: Diagnosis not present

## 2022-03-16 DIAGNOSIS — R079 Chest pain, unspecified: Secondary | ICD-10-CM | POA: Diagnosis not present

## 2022-03-16 DIAGNOSIS — H538 Other visual disturbances: Secondary | ICD-10-CM | POA: Diagnosis not present

## 2022-03-16 LAB — CBC
HCT: 42.2 % (ref 36.0–46.0)
Hemoglobin: 14 g/dL (ref 12.0–15.0)
MCH: 28.7 pg (ref 26.0–34.0)
MCHC: 33.2 g/dL (ref 30.0–36.0)
MCV: 86.5 fL (ref 80.0–100.0)
Platelets: 180 10*3/uL (ref 150–400)
RBC: 4.88 MIL/uL (ref 3.87–5.11)
RDW: 12.2 % (ref 11.5–15.5)
WBC: 5.9 10*3/uL (ref 4.0–10.5)
nRBC: 0 % (ref 0.0–0.2)

## 2022-03-16 LAB — BASIC METABOLIC PANEL
Anion gap: 9 (ref 5–15)
BUN: 12 mg/dL (ref 6–20)
CO2: 23 mmol/L (ref 22–32)
Calcium: 9.2 mg/dL (ref 8.9–10.3)
Chloride: 105 mmol/L (ref 98–111)
Creatinine, Ser: 0.7 mg/dL (ref 0.44–1.00)
GFR, Estimated: >60 mL/min (ref >60)
Glucose, Bld: 113 mg/dL — ABNORMAL HIGH (ref 70–99)
Potassium: 4 mmol/L (ref 3.5–5.1)
Sodium: 137 mmol/L (ref 135–145)

## 2022-03-16 LAB — MAGNESIUM: Magnesium: 2.1 mg/dL (ref 1.7–2.4)

## 2022-03-16 LAB — TROPONIN I (HIGH SENSITIVITY): Troponin I (High Sensitivity): <2 ng/L (ref <18)

## 2022-03-16 LAB — TSH: TSH: 2.33 u[IU]/mL (ref 0.350–4.500)

## 2022-03-16 LAB — T4, FREE: Free T4: 0.94 ng/dL (ref 0.61–1.12)

## 2022-03-16 NOTE — ED Provider Triage Note (Signed)
  Emergency Medicine Provider Triage Evaluation Note  Maria Cooper , a 27 y.o.female,  was evaluated in triage.  Pt complains of chest pain or shortness of breath x 6 days.  She states that her chest pain is left-sided with radiation to the left shoulder.  She was recently evaluated at emergency department on 03/12/2022 for similar symptoms.  Reportedly has a recent diagnosis of atrial fibrillation.   Review of Systems  Positive: Chest pain, shortness of breath Negative: Denies fever, abdominal pain, vomiting  Physical Exam   Vitals:   03/16/22 1215  BP: 129/81  Pulse: (!) 120  Resp: 18  Temp: 98 F (36.7 C)  SpO2: 100%   Gen:   Awake, no distress   Resp:  Normal effort  MSK:   Moves extremities without difficulty  Other:    Medical Decision Making  Given the patient's initial medical screening exam, the following diagnostic evaluation has been ordered. The patient will be placed in the appropriate treatment space, once one is available, to complete the evaluation and treatment. I have discussed the plan of care with the patient and I have advised the patient that an ED physician or mid-level practitioner will reevaluate their condition after the test results have been received, as the results may give them additional insight into the type of treatment they may need.    Diagnostics: Labs, EKG, CXR  Treatments: none immediately   Teodoro Spray, [place] 03/16/22 Maria Cooper

## 2022-03-16 NOTE — ED Triage Notes (Signed)
Pt here with cp and sob since Friday. Pt states pain is centered and radiates to her shoulders. Pt states she was recently dx with a fib. Pt states when is up her HR fluctuates.

## 2022-03-16 NOTE — ED Provider Notes (Signed)
[facility] Provider Note    Event Date/Time   First MD Initiated Contact with Patient 03/16/22 1258     (approximate)   History   Chest Pain   HPI  Maria Cooper is a 27 y.o. female she reports no major past medical history.  For about 2 weeks she has had intermittent achiness in her chest, and has noticed on her heart monitor that her heart rate will vary between 50 to about 150 at times somewhat randomly.  During these episodes she will feel little bit lightheaded when her heart rate is high  She was seen at the ER and discharged and is taking potassium prescription.  She is has a follow-up on January 11 with cardiology.  She comes back as she reports she is not having any further chest pain or trouble breathing but she is just noticing that seemingly at random times her heart rate will jump up to as high as 140 and then it will come back down to 50 at times.  No fevers no chills reports she tested negative for COVID and flu at [facility].  She had a chest CT and is taking the rest of her potassium prescription.  She does not feel worse but continues to just have these weird episodes of her heart rate suddenly elevating and she is not sure why   Reviewed ER visit from December 31 patient was seen for chest pain and shortness of breath on 03/12/2022.  At that time she had a workup she had a CT angiogram of the chest with no pulmonary embolism.  Had negative COVID and flu testing.  She had normal pulse oximetry with walking but was noted to have tachycardia at times when provider would walk into the room.  It was recommended she follow-up with primary care and pulmonology and she was also treated for hypokalemia.  No leg swelling.  No nausea or vomiting.  Denies pregnancy  Physical Exam   Triage Vital Signs: ED Triage Vitals [03/16/22 1215]  Enc Vitals Group     BP 129/81     Pulse Rate (!) 120     Resp 18     Temp 98 F (36.7 C)     Temp  Source Oral     SpO2 100 %     Weight 151 lb 14.4 oz (68.9 kg)     Height 5\' 6"  (1.676 m)     Head Circumference      Peak Flow      Pain Score 5     Pain Loc      Pain Edu?      Excl. in [place]?     Most recent vital signs: Vitals:   03/16/22 1215  BP: 129/81  Pulse: (!) 120  Resp: 18  Temp: 98 F (36.7 C)  SpO2: 100%     General: Awake, no distress.  Sting comfortably in the hallway without distress.  Very pleasant CV:  Good peripheral perfusion.  Mild tachycardia without murmur.  Heart rate approximately 110Resp:  Normal effort.  Clear bilaterally Abd:  No distention.  Other:  No lower extremity edema  Awake alert fully oriented nontoxic well-appearing.   ED Results / Procedures / Treatments   Labs (all labs ordered are listed, but only abnormal results are displayed) Labs Reviewed  BASIC METABOLIC PANEL - Abnormal; Notable for the following components:      Result Value   Glucose, Bld 113 (*)  All other components within normal limits  CBC  TSH  T4, FREE  MAGNESIUM  POC URINE PREG, ED  TROPONIN I (HIGH SENSITIVITY)     EKG  EKG interpreted by me at 1212 heart rate 130 QRS 80 QTc 420 Sinus tachycardia.   RADIOLOGY  Chest x-ray interpreted by me as normal  I reviewed her CT scan from March 12, 2022 which showed no evidence of PE or acute intrathoracic process.  PROCEDURES:  Critical Care performed: No  Procedures   MEDICATIONS ORDERED IN ED: Medications - No data to display   IMPRESSION / MDM / [place] / ED COURSE  I reviewed the triage vital signs and the nursing notes.                              I reviewed her workup from [facility] which was quite extensive.  Essentially revealing hypokalemia that was repleted and today has normalized.  She had negative pregnancy test normal troponin normal BNP normal CT angiogram of the chest.  She had a broad workup for her dyspnea and palpitations.  No evidence of acute cardiac  ischemia.  EKG here demonstrates sinus tachycardia.  It has notable variability she demonstrates to be on her heart rate watch to that at times that her heart rate will be resting in the 50-60 range but then will shoot up to as high as 150 and come back down.  Differential diagnosis includes, but is not limited to, possible increased sympathetic tone, anxiety, reflex tachycardia, hyperthyroidism (no fevers no anterior neck pain no signs of enlarged thyroid on exam), PE, ACS pleurisy viral syndrome, heart failure etc. are all considered.  Chest x-ray clear today.  Reassuring workup normal vital signs with exception to variable heart rate.  Question if this might be being driven by increased sympathetic tone or anxiety.  Her workup has been quite reassuring.  Today her troponin is normal her potassium and electrolytes have normalized  Will check thyroid studies and magnesium, if these are normal I think appropriate follow-up with precautions and outpatient cardiology scheduled for January 11 remains appropriate.  TSH, T4, Magnesium results is normal  Patient's presentation is most consistent with acute complicated illness / injury requiring diagnostic workup.     ----------------------------------------- 2:43 PM on 03/16/2022 ----------------------------------------- Patient resting without distress.  Comfortable with plan for discharge at this point strongly recommend she continue to follow-up with cardiology as she has scheduled upcoming next week.  Discussed careful return precautions and to come back right away if she has symptoms such as passing out feels she is going to pass out, has severe chest pain difficulty breathing or other new concerns arise.     FINAL CLINICAL IMPRESSION(S) / ED DIAGNOSES   Final diagnoses:  Palpitations     Rx / DC Orders   ED Discharge Orders     None        Note:  This document was prepared using Dragon voice recognition software and may include  unintentional dictation errors.   Delman Kitten, MD 03/16/22 1444
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
## 2022-03-23 ENCOUNTER — Encounter: Payer: Self-pay | Admitting: Cardiovascular Disease

## 2022-03-23 ENCOUNTER — Ambulatory Visit (INDEPENDENT_AMBULATORY_CARE_PROVIDER_SITE_OTHER): Payer: BC Managed Care – PPO

## 2022-03-23 ENCOUNTER — Ambulatory Visit: Payer: BC Managed Care – PPO | Attending: Cardiovascular Disease | Admitting: Cardiovascular Disease

## 2022-03-23 VITALS — BP 110/70 | HR 122 | Ht 66.0 in | Wt 152.0 lb

## 2022-03-23 DIAGNOSIS — R000 Tachycardia, unspecified: Secondary | ICD-10-CM

## 2022-03-23 DIAGNOSIS — R002 Palpitations: Secondary | ICD-10-CM

## 2022-03-23 DIAGNOSIS — R0602 Shortness of breath: Secondary | ICD-10-CM

## 2022-03-23 DIAGNOSIS — N3001 Acute cystitis with hematuria: Secondary | ICD-10-CM | POA: Diagnosis not present

## 2022-03-23 DIAGNOSIS — R072 Precordial pain: Secondary | ICD-10-CM | POA: Diagnosis not present

## 2022-03-23 DIAGNOSIS — T3695XA Adverse effect of unspecified systemic antibiotic, initial encounter: Secondary | ICD-10-CM | POA: Diagnosis not present

## 2022-03-23 DIAGNOSIS — R300 Dysuria: Secondary | ICD-10-CM | POA: Diagnosis not present

## 2022-03-23 DIAGNOSIS — B379 Candidiasis, unspecified: Secondary | ICD-10-CM | POA: Diagnosis not present

## 2022-03-23 NOTE — Patient Instructions (Signed)
Medication Instructions:  The current medical regimen is effective;  continue present plan and medications.  *If you need a refill on your cardiac medications before your next appointment, please call your pharmacy*   Testing/Procedures:  Echocardiogram - Your physician has requested that you have an echocardiogram. Echocardiography is a painless test that uses sound waves to create images of your heart. It provides your doctor with information about the size and shape of your heart and how well your heart's chambers and valves are working. This procedure takes approximately one hour. There are no restrictions for this procedure.   ZIO XT- Long Term Monitor Instructions  Your physician has requested you wear a ZIO patch monitor for 7 days.  This is a single patch monitor. [employer] supplies one patch monitor per enrollment. Additional stickers are not available. Please do not apply patch if you will be having a Nuclear Stress Test,  Echocardiogram, Cardiac CT, MRI, or Chest Xray during the period you would be wearing the  monitor. The patch cannot be worn during these tests. You cannot remove and re-apply the  ZIO XT patch monitor.  Your ZIO patch monitor will be mailed 3 day USPS to your address on file. It may take 3-5 days  to receive your monitor after you have been enrolled.  Once you have received your monitor, please review the enclosed instructions. Your monitor  has already been registered assigning a specific monitor serial # to you.  Billing and Patient Assistance Program Information  We have supplied [employer] with any of your insurance information on file for billing purposes. [employer] offers a sliding scale Patient Assistance Program for patients that do not have  insurance, or whose insurance does not completely cover the cost of the ZIO monitor.  You must apply for the Patient Assistance Program to qualify for this discounted rate.  To apply, please call [employer] at  [phone], select option 4, select option 2, ask to apply for  Patient Assistance Program. Theodore Demark will ask your household income, and how many people  are in your household. They will quote your out-of-pocket cost based on that information.  [employer] will also be able to set up a 76-month interest-free payment plan if needed.  Applying the monitor   Shave hair from upper left chest.  Hold abrader disc by orange tab. Rub abrader in 40 strokes over the upper left chest as  indicated in your monitor instructions.  Clean area with 4 enclosed alcohol pads. Let dry.  Apply patch as indicated in monitor instructions. Patch will be placed under collarbone on left  side of chest with arrow pointing upward.  Rub patch adhesive wings for 2 minutes. Remove white label marked "1". Remove the white  label marked "2". Rub patch adhesive wings for 2 additional minutes.  While looking in a mirror, press and release button in center of patch. A small green light will  flash 3-4 times. This will be your only indicator that the monitor has been turned on.  Do not shower for the first 24 hours. You may shower after the first 24 hours.  Press the button if you feel a symptom. You will hear a small click. Record Date, Time and  Symptom in the Patient Logbook.  When you are ready to remove the patch, follow instructions on the last 2 pages of Patient  Logbook. Stick patch monitor onto the last page of Patient Logbook.  Place Patient Logbook in the blue and white box. Use locking tab  on box and tape box closed  securely. The blue and white box has prepaid postage on it. Please place it in the mailbox as  soon as possible. Your physician should have your test results approximately 7 days after the  monitor has been mailed back to [facility].  Call [facility] at [phone] if you have questions regarding  your ZIO XT patch monitor. Call them immediately if you see an orange light  blinking on your  monitor.  If your monitor falls off in less than 4 days, contact our Monitor department at [phone].  If your monitor becomes loose or falls off after 4 days call [employer] at [phone] for  suggestions on securing your monitor    Follow-Up: At [facility], you and your health needs are our priority.  As part of our continuing mission to provide you with exceptional heart care, we have created designated Provider Care Teams.  These Care Teams include your primary Cardiologist (physician) and Advanced Practice Providers (APPs -  Physician Assistants and Nurse Practitioners) who all work together to provide you with the care you need, when you need it.  We recommend signing up for the patient portal called "MyChart".  Sign up information is provided on this After Visit Summary.  MyChart is used to connect with patients for Virtual Visits (Telemedicine).  Patients are able to view lab/test results, encounter notes, upcoming appointments, etc.  Non-urgent messages can be sent to your provider as well.   To learn more about what you can do with MyChart, go to [email or web address].    Your next appointment:   3 month(s)  Provider:   Eleonore Chiquito, MD

## 2022-03-23 NOTE — Progress Notes (Unsigned)
Enrolled patient for a 7 day Zio XT monitor to be mailed to patients home.  

## 2022-03-30 DIAGNOSIS — Z1331 Encounter for screening for depression: Secondary | ICD-10-CM | POA: Diagnosis not present

## 2022-03-30 DIAGNOSIS — E663 Overweight: Secondary | ICD-10-CM | POA: Diagnosis not present

## 2022-03-30 DIAGNOSIS — Z0001 Encounter for general adult medical examination with abnormal findings: Secondary | ICD-10-CM | POA: Diagnosis not present

## 2022-03-30 DIAGNOSIS — R42 Dizziness and giddiness: Secondary | ICD-10-CM | POA: Diagnosis not present

## 2022-03-30 DIAGNOSIS — Z6825 Body mass index (BMI) 25.0-25.9, adult: Secondary | ICD-10-CM | POA: Diagnosis not present

## 2022-03-31 ENCOUNTER — Ambulatory Visit: Payer: BC Managed Care – PPO | Attending: Cardiovascular Disease

## 2022-03-31 DIAGNOSIS — R002 Palpitations: Secondary | ICD-10-CM

## 2022-03-31 DIAGNOSIS — R0602 Shortness of breath: Secondary | ICD-10-CM | POA: Diagnosis not present

## 2022-03-31 LAB — ECHOCARDIOGRAM COMPLETE
AR max vel: 2.68 cm2
AV Area VTI: 2.45 cm2
AV Area mean vel: 2.39 cm2
AV Mean grad: 3 mmHg
AV Peak grad: 5.5 mmHg
Ao pk vel: 1.17 m/s
Area-P 1/2: 3.17 cm2
S' Lateral: 2.8 cm

## 2022-04-07 DIAGNOSIS — R42 Dizziness and giddiness: Secondary | ICD-10-CM | POA: Diagnosis not present

## 2022-04-07 DIAGNOSIS — Z0001 Encounter for general adult medical examination with abnormal findings: Secondary | ICD-10-CM | POA: Diagnosis not present

## 2022-04-11 DIAGNOSIS — R002 Palpitations: Secondary | ICD-10-CM | POA: Diagnosis not present

## 2022-04-21 ENCOUNTER — Encounter: Payer: Self-pay | Admitting: Cardiovascular Disease

## 2022-04-21 MED ORDER — PROPRANOLOL HCL 10 MG PO TABS: 10.0000 mg | ORAL_TABLET | Freq: Two times a day (BID) | ORAL | 1 refills | Status: AC

## 2022-04-21 NOTE — Telephone Encounter (Signed)
Propranolol 10 mg BID. She needs to see me in 2 months.  [place] B[place] T. Audie Box, MD, [place] [address], [place], [place] [address] [phone] 1:43 PM

## 2022-06-23 ENCOUNTER — Ambulatory Visit: Payer: BC Managed Care – PPO | Admitting: Cardiovascular Disease

## 2022-07-11 DIAGNOSIS — R748 Abnormal levels of other serum enzymes: Secondary | ICD-10-CM

## 2022-07-11 DIAGNOSIS — K760 Fatty (change of) liver, not elsewhere classified: Secondary | ICD-10-CM

## 2022-07-11 NOTE — Telephone Encounter (Signed)
-----   Message from Cooper Render, CMA sent at 10/13/2021  2:56 PM EDT ----- Regarding: LFTs due LFTs due in 9 months = (early May 2024)

## 2022-07-11 NOTE — Telephone Encounter (Signed)
MyChart message sent to patient to go to the lab for LFTs one day next week

## 2022-07-13 NOTE — Telephone Encounter (Signed)
Called and spoke to patient, reminding her to go to the lab for LFTs in the next week. She expressed understanding

## 2022-07-14 ENCOUNTER — Ambulatory Visit: Payer: BC Managed Care – PPO | Attending: Cardiovascular Disease | Admitting: Nurse Practitioner

## 2022-07-14 ENCOUNTER — Encounter: Payer: Self-pay | Admitting: Nurse Practitioner

## 2022-07-14 VITALS — BP 114/62 | HR 110 | Ht 66.0 in | Wt 154.8 lb

## 2022-07-14 DIAGNOSIS — R072 Precordial pain: Secondary | ICD-10-CM

## 2022-07-14 DIAGNOSIS — R002 Palpitations: Secondary | ICD-10-CM

## 2022-07-14 DIAGNOSIS — R000 Tachycardia, unspecified: Secondary | ICD-10-CM | POA: Diagnosis not present

## 2022-07-14 DIAGNOSIS — R0602 Shortness of breath: Secondary | ICD-10-CM
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
## 2022-07-14 NOTE — Patient Instructions (Signed)
Medication Instructions:  Your physician recommends that you continue on your current medications as directed. Please refer to the Current Medication list given to you today.  *If you need a refill on your cardiac medications before your next appointment, please call your pharmacy*   Lab Work: CMET at your convenience.  If you have labs (blood work) drawn today and your tests are completely normal, you will receive your results only by: MyChart Message (if you have MyChart) OR A paper copy in the mail If you have any lab test that is abnormal or we need to change your treatment, we will call you to review the results.   Testing/Procedures: NONE ordered at this time of appointment     Follow-Up: At [facility], you and your health needs are our priority.  As part of our continuing mission to provide you with exceptional heart care, we have created designated Provider Care Teams.  These Care Teams include your primary Cardiologist (physician) and Advanced Practice Providers (APPs -  Physician Assistants and Nurse Practitioners) who all work together to provide you with the care you need, when you need it.  We recommend signing up for the patient portal called "MyChart".  Sign up information is provided on this After Visit Summary.  MyChart is used to connect with patients for Virtual Visits (Telemedicine).  Patients are able to view lab/test results, encounter notes, upcoming appointments, etc.  Non-urgent messages can be sent to your provider as well.   To learn more about what you can do with MyChart, go to [email or web address].    Your next appointment:   6 month(s)  Provider:   Reatha Harps, MD     Other Instructions

## 2022-07-15 LAB — COMPREHENSIVE METABOLIC PANEL
ALT: 19 IU/L (ref 0–32)
AST: 20 IU/L (ref 0–40)
Albumin/Globulin Ratio: 2.4 — ABNORMAL HIGH (ref 1.2–2.2)
Albumin: 4.5 g/dL (ref 4.0–5.0)
Alkaline Phosphatase: 84 IU/L (ref 44–121)
BUN/Creatinine Ratio: 11 (ref 9–23)
BUN: 9 mg/dL (ref 6–20)
Bilirubin Total: 0.6 mg/dL (ref 0.0–1.2)
CO2: 24 mmol/L (ref 20–29)
Calcium: 9.4 mg/dL (ref 8.7–10.2)
Chloride: 105 mmol/L (ref 96–106)
Creatinine, Ser: 0.79 mg/dL (ref 0.57–1.00)
Globulin, Total: 1.9 g/dL (ref 1.5–4.5)
Glucose: 95 mg/dL (ref 70–99)
Potassium: 4.4 mmol/L (ref 3.5–5.2)
Sodium: 141 mmol/L (ref 134–144)
Total Protein: 6.4 g/dL (ref 6.0–8.5)
eGFR: 104 mL/min/{1.73_m2} (ref >59)

## 2022-07-21 ENCOUNTER — Telehealth: Payer: Self-pay

## 2022-07-21 NOTE — Telephone Encounter (Signed)
Spoke with pt. Pt was notified of lab results. Pt will continue her current medication and f/u as planned.  

## 2022-08-04 DIAGNOSIS — J029 Acute pharyngitis, unspecified: Secondary | ICD-10-CM | POA: Diagnosis not present

## 2022-08-15 ENCOUNTER — Ambulatory Visit (INDEPENDENT_AMBULATORY_CARE_PROVIDER_SITE_OTHER): Payer: BC Managed Care – PPO | Admitting: Women's Health

## 2022-08-15 ENCOUNTER — Other Ambulatory Visit (HOSPITAL_COMMUNITY)
Admission: RE | Admit: 2022-08-15 | Discharge: 2022-08-15 | Disposition: A | Discharge status: Home / Self Care | Payer: BC Managed Care – PPO | Source: Ambulatory Visit | Attending: Women's Health | Admitting: Women's Health

## 2022-08-15 ENCOUNTER — Encounter: Payer: Self-pay | Admitting: Women's Health

## 2022-08-15 VITALS — BP 116/72 | HR 86 | Ht 66.0 in | Wt 151.4 lb

## 2022-08-15 DIAGNOSIS — Z01419 Encounter for gynecological examination (general) (routine) without abnormal findings: Secondary | ICD-10-CM | POA: Insufficient documentation

## 2022-08-15 DIAGNOSIS — F418 Other specified anxiety disorders: Secondary | ICD-10-CM | POA: Diagnosis not present

## 2022-08-15 MED ORDER — BUPROPION HCL ER (XL) 150 MG PO TB24: 150.0000 mg | ORAL_TABLET | Freq: Every day | ORAL | 6 refills | Status: DC

## 2022-08-15 NOTE — Progress Notes (Signed)
WELL-WOMAN EXAMINATION Patient name: Maria Cooper MRN [id number]  Date of birth: 06/17/1995 Chief Complaint:   Gynecologic Exam (Pap/pyscial, last pap 08-07-19 normal)  History of Present Illness:   Maria Cooper is a 27 y.o. [phone] Caucasian female being seen today for a routine well-woman exam.  Current complaints: dep/anx x 66yrs, has been trying to deal w/ it, but thinks she'd like to try some medicine, has been doing research and wants to try wellbutrin. Denies SI/HI/II. Declines therapy.  Heavy periods, interested in BTL/ablation Bump Lt vaginal wall x , seems to be bigger  PCP: Robbie Lis      does not desire labs Patient's last menstrual period was 07/29/2022. The current method of family planning is vasectomy.  Last pap 08/07/19. Results were: NILM w/ HRHPV negative. H/O abnormal pap: no Last mammogram: never. Results were: N/A. Family h/o breast cancer: yes PGM Last colonoscopy: never. Results were: N/A. Family h/o colorectal cancer: no     08/15/2022    3:34 PM 11/28/2019   10:27 AM 08/07/2019    3:25 PM 10/05/2017    2:12 PM 07/02/2017    2:43 PM  Depression screen PHQ 2/9  Decreased Interest 1 0 0 0 0  Down, Depressed, Hopeless 1 0 0 0 0  PHQ - 2 Score 2 0 0 0 0  Altered sleeping 1 0 0  0  Tired, decreased energy 2 0 0  0  Change in appetite 0 0 0  0  Feeling bad or failure about yourself  0 0 0  0  Trouble concentrating 1 0 0  0  Moving slowly or fidgety/restless 0 0 0  0  Suicidal thoughts 0 0 0  0  PHQ-9 Score 6 0 0  0  Difficult doing work/chores   Not difficult at all          08/15/2022    3:35 PM 11/28/2019   10:28 AM 08/07/2019    3:25 PM  GAD 7 : Generalized Anxiety Score  Nervous, Anxious, on Edge 0 0 0  Control/stop worrying 0 0 0  Worry too much - different things 1 0 0  Trouble relaxing 1 0 0  Restless 0 0 0  Easily annoyed or irritable 2 0 0  Afraid - awful might happen 1 0 0  Total GAD 7 Score 5 0 0  Anxiety Difficulty   Not  difficult at all     Review of Systems:   Pertinent items are noted in HPI Denies any headaches, blurred vision, fatigue, shortness of breath, chest pain, abdominal pain, abnormal vaginal discharge/itching/odor/irritation, problems with periods, bowel movements, urination, or intercourse unless otherwise stated above. Pertinent History Reviewed:  Reviewed past medical,surgical, social and family history.  Reviewed problem list, medications and allergies. Physical Assessment:   Vitals:   08/15/22 1529  BP: 116/72  Pulse: 86  Weight: 151 lb 6.4 oz (68.7 kg)  Height: 5\' 6"  (1.676 m)  Body mass index is 24.44 kg/m.        Physical Examination:   General appearance - well appearing, and in no distress  Mental status - alert, oriented to person, place, and time  Psych:  She has a normal mood and affect  Skin - warm and dry, normal color, no suspicious lesions noted  Chest - effort normal, all lung fields clear to auscultation bilaterally  Heart - normal rate and regular rhythm  Neck:  midline trachea, no thyromegaly or nodules  Breasts - breasts appear normal,  no suspicious masses, no skin or nipple changes or  axillary nodes  Abdomen - soft, nontender, nondistended, no masses or organomegaly  Pelvic - VULVA: normal appearing vulva with no masses, tenderness or lesions  VAGINA: normal appearing vagina with normal color and discharge, small flesh colored smooth bump Lt vaginal wall, appears like rugae but when felt- feels like a bump  CERVIX: normal appearing cervix without discharge or lesions, no CMT  Thin prep pap is done w/ HR HPV cotesting  UTERUS: uterus is felt to be normal size, shape, consistency and nontender   ADNEXA: No adnexal masses or tenderness noted.  Extremities:  No swelling or varicosities noted  Chaperone: Peggy Dones    No results found for this or any previous visit (from the past 24 hour(s)).  Assessment & Plan:  1) Well-Woman Exam  2) Mild dep/anx> rx  wellbutrin 150mg  daily per pt request, declines therapy, f/u 4wks  3) Heavy periods, interested in BTL & ablation> to make appt w/ MD to discuss  4) Vaginal bump> smooth, flesh colored, appears/feels normal. To monitor, if any changes let us know  Labs/procedures today: pap  Mammogram: @ 28yo, or sooner if problems Colonoscopy: @ 28yo, or sooner if problems  No orders of the defined types were placed in this encounter.   Meds:  Meds ordered this encounter  Medications   buPROPion (WELLBUTRIN XL) 150 MG 24 hr tablet    Sig: Take 1 tablet (150 mg total) by mouth daily.    Dispense:  30 tablet    Refill:  6    Follow-up: Return in about 4 weeks (around 09/12/2022) for [facility] or [place] to discuss BTL/ablation and f/u on meds.  Cheral Marker CNM, [facility] 08/15/2022 4:29 PM

## 2022-08-21 LAB — CYTOLOGY - PAP
Comment: NEGATIVE
Diagnosis: NEGATIVE
High risk HPV: NEGATIVE

## 2022-09-11 HISTORY — PX: WISDOM TOOTH EXTRACTION: SHX21

## 2022-09-19 ENCOUNTER — Ambulatory Visit: Payer: BC Managed Care – PPO | Admitting: Obstetrics & Gynecology

## 2022-09-27 ENCOUNTER — Telehealth: Payer: Self-pay | Admitting: Gastroenterology

## 2022-09-27 NOTE — Telephone Encounter (Signed)
Her liver enzymes are normal, we can repeat in one year from her last draw given stability over time. Thanks

## 2022-09-27 NOTE — Telephone Encounter (Signed)
Lab reminder in epic to repeat LFTs 07/2023. MyChart message sent to patient with recommendations.

## 2022-09-27 NOTE — Telephone Encounter (Signed)
Inbound call from patient stating that she recently had CMP and liver panels drawn with her cardiologist. Patient is requesting a call to discuss if she can use those results or if she needs to have them drawn again per Dr. Adela Lank request. Please advise.

## 2023-01-18 NOTE — Progress Notes (Signed)
Cardiology Office Note:  .   Date:  01/19/2023  ID:  Maria Cooper, DOB 12/22/1995, MRN [id number] PCP: Ladon Applebaum  [facility] Providers Cardiologist:  Reatha Harps, MD { History of Present Illness: .   Maria Cooper is a 27 y.o. female with history of tachycardia who presents for follow-up.   History of Present Illness   Maria Cooper, a 27 year old with a history of sinus tachycardia, presents for follow-up. Earlier this year, she experienced palpitations and was found to have sinus tachycardia. Her symptoms have improved with as-needed propranolol. She reports that her heart rate feels high during activity or when she stands up quickly, but she has become accustomed to this sensation. Occasionally, she experiences periods where her heart rate remains elevated, during which she takes propranolol for a few days with good effect. She reports feeling sluggish and believes the medication may lower her blood pressure.          Problem List Fatty liver -Occurred in pregnancy, resolved 2. Sinus tachycardia    ROS: All other ROS reviewed and negative. Pertinent positives noted in the HPI.     Studies Reviewed: Marland Kitchen   EKG Interpretation Date/Time:  Friday January 19 2023 12:50:51 EST Ventricular Rate:  108 PR Interval:  136 QRS Duration:  76 QT Interval:  332 QTC Calculation: 444 R Axis:   85  Text Interpretation: Sinus tachycardia Confirmed by Lennie Odor [phone]) on 01/19/2023 1:03:35 PM   Zio 04/20/2022 Impression: No arrhythmias detected.  Rare ectopy.   TTE 03/31/2022  1. Left ventricular ejection fraction, by estimation, is 60 to 65%. The  left ventricle has normal function. The left ventricle has no regional  wall motion abnormalities. Left ventricular diastolic parameters were  normal.   2. Right ventricular systolic function is normal. The right ventricular  size is normal. Tricuspid regurgitation signal is inadequate for assessing  PA  pressure.   3. The mitral valve is normal in structure. No evidence of mitral valve  regurgitation. No evidence of mitral stenosis.   4. The aortic valve is normal in structure. Aortic valve regurgitation is  not visualized. No aortic stenosis is present.   5. The inferior vena cava is normal in size with greater than 50%  respiratory variability, suggesting right atrial pressure of 3 mmHg.    Physical Exam:   VS:  BP 104/69   Pulse (!) 108   Ht 5\' 6"  (1.676 m)   Wt 141 lb 9.6 oz (64.2 kg)   SpO2 99%   BMI 22.85 kg/m    Wt Readings from Last 3 Encounters:  01/19/23 141 lb 9.6 oz (64.2 kg)  08/15/22 151 lb 6.4 oz (68.7 kg)  07/14/22 154 lb 12.8 oz (70.2 kg)    GEN: Well nourished, well developed in no acute distress NECK: No JVD; No carotid bruits CARDIAC: RRR, no murmurs, rubs, gallops RESPIRATORY:  Clear to auscultation without rales, wheezing or rhonchi  ABDOMEN: Soft, non-tender, non-distended EXTREMITIES:  No edema; No deformity  ASSESSMENT AND PLAN: .   Assessment and Plan    Sinus Tachycardia Improved symptoms with PRN Propranolol. Reports occasional palpitations with activity or illness, but has adapted to the sensation. No new or worsening symptoms. -Continue Propranolol as needed. -Consider Ivabradine if symptoms worsen or become more frequent. -Annual follow-up or sooner if symptoms change. -Encouraged to maintain hydration, regular meals, adequate sleep, and stress management.  Follow-up: Return in about 1 year (around 01/19/2024).  Time Spent with Patient: I have spent a total of 25 minutes caring for this patient today face to face, ordering and reviewing labs/tests, reviewing prior records/medical history, examining the patient, establishing an assessment and plan, communicating results/findings to the patient/family, and documenting in the medical record.   Signed, Lenna Gilford. Flora Lipps, MD, [facility]  [facility]  [address],  [address] [place], [place] [address] [phone]  1:52 PM

## 2023-01-19 ENCOUNTER — Ambulatory Visit: Payer: BC Managed Care – PPO | Attending: Cardiovascular Disease | Admitting: Cardiovascular Disease

## 2023-01-19 ENCOUNTER — Encounter: Payer: Self-pay | Admitting: Cardiovascular Disease

## 2023-01-19 VITALS — BP 104/69 | HR 108 | Ht 66.0 in | Wt 141.6 lb

## 2023-01-19 DIAGNOSIS — R002 Palpitations: Secondary | ICD-10-CM

## 2023-01-19 DIAGNOSIS — R000 Tachycardia, unspecified: Secondary | ICD-10-CM

## 2023-01-19 NOTE — Patient Instructions (Signed)
    Follow-Up: At [facility], you and your health needs are our priority.  As part of our continuing mission to provide you with exceptional heart care, we have created designated Provider Care Teams.  These Care Teams include your primary Cardiologist (physician) and Advanced Practice Providers (APPs -  Physician Assistants and Nurse Practitioners) who all work together to provide you with the care you need, when you need it.  We recommend signing up for the patient portal called "MyChart".  Sign up information is provided on this After Visit Summary.  MyChart is used to connect with patients for Virtual Visits (Telemedicine).  Patients are able to view lab/test results, encounter notes, upcoming appointments, etc.  Non-urgent messages can be sent to your provider as well.   To learn more about what you can do with MyChart, go to [email or web address].    Your next appointment:   12 month(s)  Provider:   Evalina Field, MD

## 2023-01-23 ENCOUNTER — Ambulatory Visit: Payer: BC Managed Care – PPO | Admitting: Women's Health

## 2023-01-23 ENCOUNTER — Encounter: Payer: Self-pay | Admitting: Women's Health

## 2023-01-23 VITALS — BP 104/73 | HR 112 | Ht 66.0 in | Wt 142.3 lb

## 2023-01-23 DIAGNOSIS — N898 Other specified noninflammatory disorders of vagina: Secondary | ICD-10-CM | POA: Diagnosis not present

## 2023-01-23 DIAGNOSIS — F418 Other specified anxiety disorders: Secondary | ICD-10-CM

## 2023-01-23 DIAGNOSIS — N920 Excessive and frequent menstruation with regular cycle: Secondary | ICD-10-CM | POA: Diagnosis not present

## 2023-01-23 MED ORDER — BUPROPION HCL ER (XL) 300 MG PO TB24: 300.0000 mg | ORAL_TABLET | Freq: Every day | ORAL | 6 refills | Status: DC

## 2023-01-23 NOTE — Progress Notes (Signed)
GYN VISIT Patient name: Maria Cooper MRN [id number]  Date of birth: [birth year] Chief Complaint:   vaginal bump  History of Present Illness:   Maria Cooper is a 27 y.o. [phone] Caucasian female being seen today for report of continued vaginal bump and 2 more that showed up recently, sometimes hurts w/ sex, no vb w/ it. Still having heavy periods, had considered ablation, wants to try COCs. Does not smoke, no h/o HTN, DVT/PE, CVA, MI, or migraines w/ aura.  Feels like wellbutrin helped w/ depression at first, now feels it may need to be increased or something added. Some thoughts sometimes of harming self, none currently and no plan. Somewhat decreased appetite, sleeps well- sometimes has to take melatonin. Discussed [facility], is interested in that.   Patient's last menstrual period was 01/16/2023. The current method of family planning is vasectomy.  Last pap 08/15/22. Results were: NILM w/ HRHPV negative     01/23/2023    4:53 PM 08/15/2022    3:34 PM 11/28/2019   10:27 AM 08/07/2019    3:25 PM 10/05/2017    2:12 PM  Depression screen PHQ 2/9  Decreased Interest 1 1 0 0 0  Down, Depressed, Hopeless 1 1 0 0 0  PHQ - 2 Score 2 2 0 0 0  Altered sleeping 1 1 0 0   Tired, decreased energy 1 2 0 0   Change in appetite 0 0 0 0   Feeling bad or failure about yourself  1 0 0 0   Trouble concentrating 1 1 0 0   Moving slowly or fidgety/restless 1 0 0 0   Suicidal thoughts 1 0 0 0   PHQ-9 Score 8 6 0 0   Difficult doing work/chores Somewhat difficult   Not difficult at all         01/23/2023    4:55 PM 08/15/2022    3:35 PM 11/28/2019   10:28 AM 08/07/2019    3:25 PM  GAD 7 : Generalized Anxiety Score  Nervous, Anxious, on Edge 0 0 0 0  Control/stop worrying 0 0 0 0  Worry too much - different things 1 1 0 0  Trouble relaxing 1 1 0 0  Restless 0 0 0 0  Easily annoyed or irritable 1 2 0 0  Afraid - awful might happen 1 1 0 0  Total GAD 7 Score 4 5 0 0  Anxiety Difficulty Somewhat  difficult   Not difficult at all     Review of Systems:   Pertinent items are noted in HPI Denies fever/chills, dizziness, headaches, visual disturbances, fatigue, shortness of breath, chest pain, abdominal pain, vomiting, abnormal vaginal discharge/itching/odor/irritation, problems with periods, bowel movements, urination, or intercourse unless otherwise stated above.  Pertinent History Reviewed:  Reviewed past medical,surgical, social, obstetrical and family history.  Reviewed problem list, medications and allergies. Physical Assessment:   Vitals:   01/23/23 1610  BP: 104/73  Pulse: (!) 112  Weight: 142 lb 4.8 oz (64.5 kg)  Height: 5\' 6"  (1.676 m)  Body mass index is 22.97 kg/m.       Physical Examination:   General appearance: alert, well appearing, and in no distress  Mental status: alert, oriented to person, place, and time  Skin: warm & dry   Cardiovascular: normal heart rate noted  Respiratory: normal respiratory effort, no distress  Abdomen: soft, non-tender   Pelvic: 3 small smooth flesh colored bumps vaginal wall at 1, 2 and 3 o'clock, co-exam w/  LHE, feels they are inclusion or sebaceous cysts  Extremities: no edema   Chaperone: N/A    No results found for this or any previous visit (from the past 24 hour(s)).  Assessment & Plan:  1) Vaginal wall cysts> if become larger, more painful w/ sex, bleeding, etc let us know  2) Heavy periods> rx LoLoestrin, f/u  3) Depression w/ occ thoughts of harming self but no plan>none thoughts currenlty, increased wellbutrin to 300mg  while awaiting appt w/ [facility], f/u 4wks here unless able to get in w/ [facility] and they assume care. Promises to go to ED if plans to harm self  Meds:  Meds ordered this encounter  Medications   buPROPion (WELLBUTRIN XL) 300 MG 24 hr tablet    Sig: Take 1 tablet (300 mg total) by mouth daily.    Dispense:  30 tablet    Refill:  6    No orders of the defined types were placed in this  encounter.   Return for 4wks med f/u online or inperson, then for med f/u in person.  Cheral Marker CNM, [facility] 01/23/2023 5:01 PM

## 2023-01-24 ENCOUNTER — Telehealth: Payer: Self-pay

## 2023-01-24 ENCOUNTER — Other Ambulatory Visit: Payer: Self-pay | Admitting: Women's Health

## 2023-01-24 MED ORDER — LO LOESTRIN FE 1 MG-10 MCG / 10 MCG PO TABS: 1.0000 | ORAL_TABLET | Freq: Every day | ORAL | 3 refills | Status: DC

## 2023-01-24 NOTE — Telephone Encounter (Signed)
Patient called and stated that she was seen on yesterday and Grace Bushy was to call her in some Lo Lo birth control. But it is not at the pharmacy.

## 2023-02-20 ENCOUNTER — Telehealth: Payer: BC Managed Care – PPO | Admitting: Women's Health

## 2023-02-20 ENCOUNTER — Encounter: Payer: Self-pay | Admitting: Women's Health

## 2023-02-20 DIAGNOSIS — F418 Other specified anxiety disorders: Secondary | ICD-10-CM | POA: Diagnosis not present

## 2023-02-20 NOTE — Progress Notes (Signed)
TELEHEALTH VIRTUAL GYN VISIT ENCOUNTER NOTE Patient name: Maria Cooper MRN [id number]  Date of birth: 07-21-1995  I connected with patient on 02/20/23 at  1:50 PM EST by MyChart video  and verified that I am speaking with the correct person using two identifiers.  Pt is not currently in the office, she is at home.  Provider is in the office.    I discussed the limitations, risks, security and privacy concerns of performing an evaluation and management service by telephone and the availability of in person appointments. I also discussed with the patient that there may be a patient responsible charge related to this service. The patient expressed understanding and agreed to proceed.   Chief Complaint:   Follow-up (Wellbutrin)  History of Present Illness:   Maria Cooper is a 27 y.o. [phone] Caucasian female being evaluated today for f/u on dep/anx, wellbutrin increased from 150mg  to 300mg  on 11/12. States she feels like a different person, feels much better! No thoughts of self harm at all. Tried to make appt w/ [facility] but insurance didn't cover. Did make appt w/ [facility] through [facility] and has virtual visit next Tues 12/17.       02/20/2023    1:54 PM 01/23/2023    4:53 PM 08/15/2022    3:34 PM 11/28/2019   10:27 AM 08/07/2019    3:25 PM  Depression screen PHQ 2/9  Decreased Interest 0 1 1 0 0  Down, Depressed, Hopeless 0 1 1 0 0  PHQ - 2 Score 0 2 2 0 0  Altered sleeping 0 1 1 0 0  Tired, decreased energy 0 1 2 0 0  Change in appetite 0 0 0 0 0  Feeling bad or failure about yourself  0 1 0 0 0  Trouble concentrating 1 1 1  0 0  Moving slowly or fidgety/restless 0 1 0 0 0  Suicidal thoughts 0 1 0 0 0  PHQ-9 Score 1 8 6  0 0  Difficult doing work/chores  Somewhat difficult   Not difficult at all    Patient's last menstrual period was 01/30/2023. The current method of family planning is OCP (estrogen/progesterone).  Last pap 08/15/22. Results were:  NILM w/ HRHPV negative Review of  Systems:   Pertinent items are noted in HPI Denies fever/chills, dizziness, headaches, visual disturbances, fatigue, shortness of breath, chest pain, abdominal pain, vomiting, abnormal vaginal discharge/itching/odor/irritation, problems with periods, bowel movements, urination, or intercourse unless otherwise stated above.  Pertinent History Reviewed:  Reviewed past medical,surgical, social, obstetrical and family history.  Reviewed problem list, medications and allergies. Physical Assessment:  There were no vitals filed for this visit.There is no height or weight on file to calculate BMI.       Physical Examination:   General:  Alert, oriented and cooperative.   Mental Status: Normal mood and affect perceived. Normal judgment and thought content.  Physical exam deferred due to nature of the encounter  No results found for this or any previous visit (from the past 24 hour(s)).  Assessment & Plan:  1) Dep/anx> much improved on wellbutrin 300mg  daily, keep appt w/ [facility] next Tues as scheduled  Meds: No orders of the defined types were placed in this encounter.   No orders of the defined types were placed in this encounter.   I discussed the assessment and treatment plan with the patient. The patient was provided an opportunity to ask questions and all were answered. The patient agreed with the plan and  demonstrated an understanding of the instructions.   The patient was advised to call back or seek an in-person evaluation/go to the ED if the symptoms worsen or if the condition fails to improve as anticipated.  I provided 10 minutes of non-face-to-face time during this encounter.   Return for f/u on COCs in person.  Cheral Marker CNM, [facility] 02/20/2023 2:11 PM

## 2023-02-27 DIAGNOSIS — D225 Melanocytic nevi of trunk: Secondary | ICD-10-CM | POA: Diagnosis not present

## 2023-02-27 DIAGNOSIS — D2261 Melanocytic nevi of right upper limb, including shoulder: Secondary | ICD-10-CM | POA: Diagnosis not present

## 2023-02-27 DIAGNOSIS — R58 Hemorrhage, not elsewhere classified: Secondary | ICD-10-CM | POA: Diagnosis not present

## 2023-02-27 DIAGNOSIS — D2271 Melanocytic nevi of right lower limb, including hip: Secondary | ICD-10-CM | POA: Diagnosis not present

## 2023-02-27 DIAGNOSIS — D485 Neoplasm of uncertain behavior of skin: Secondary | ICD-10-CM | POA: Diagnosis not present

## 2023-02-27 DIAGNOSIS — R208 Other disturbances of skin sensation: Secondary | ICD-10-CM | POA: Diagnosis not present

## 2023-02-27 DIAGNOSIS — D2272 Melanocytic nevi of left lower limb, including hip: Secondary | ICD-10-CM | POA: Diagnosis not present

## 2023-02-27 DIAGNOSIS — D1801 Hemangioma of skin and subcutaneous tissue: Secondary | ICD-10-CM | POA: Diagnosis not present

## 2023-03-13 ENCOUNTER — Other Ambulatory Visit: Payer: Self-pay | Admitting: Women's Health

## 2023-04-16 ENCOUNTER — Telehealth: Payer: BC Managed Care – PPO | Admitting: Physician Assistant

## 2023-04-16 DIAGNOSIS — J101 Influenza due to other identified influenza virus with other respiratory manifestations: Secondary | ICD-10-CM

## 2023-04-16 MED ORDER — OSELTAMIVIR PHOSPHATE 75 MG PO CAPS: 75.0000 mg | ORAL_CAPSULE | Freq: Two times a day (BID) | ORAL | 0 refills | Status: DC

## 2023-04-16 NOTE — Progress Notes (Signed)
 E visit for Flu like symptoms   We are sorry that you are not feeling well.  Here is how we plan to help! Based on what you have shared with me it looks like you may have a respiratory virus that may be influenza.  Influenza or "the flu" is   an infection caused by a respiratory virus. The flu virus is highly contagious and persons who did not receive their yearly flu vaccination may "catch" the flu from close contact.  We have anti-viral medications to treat the viruses that cause this infection. They are not a "cure" and only shorten the course of the infection. These prescriptions are most effective when they are given within the first 2 days of "flu" symptoms. Antiviral medication are indicated if you have a high risk of complications from the flu. You should  also consider an antiviral medication if you are in close contact with someone who is at risk. These medications can help patients avoid complications from the flu  but have side effects that you should know. Possible side effects from Tamiflu or oseltamivir include nausea, vomiting, diarrhea, dizziness, headaches, eye redness, sleep problems or other respiratory symptoms. You should not take Tamiflu if you have an allergy to oseltamivir or any to the ingredients in Tamiflu.  Based upon your symptoms and potential risk factors I have prescribed Oseltamivir (Tamiflu).  It has been sent to your designated pharmacy.  You will take one 75 mg capsule orally twice a day for the next 5 days.  ANYONE WHO HAS FLU SYMPTOMS SHOULD: Stay home. The flu is highly contagious and going out or to work exposes others! Be sure to drink plenty of fluids. Water is fine as well as fruit juices, sodas and electrolyte beverages. You may want to stay away from caffeine or alcohol. If you are nauseated, try taking small sips of liquids. How do you know if you are getting enough fluid? Your urine should be a pale yellow or almost colorless. Get rest. Taking a steamy  shower or using a humidifier may help nasal congestion and ease sore throat pain. Using a saline nasal spray works much the same way. Cough drops, hard candies and sore throat lozenges may ease your cough. Line up a caregiver. Have someone check on you regularly.   GET HELP RIGHT AWAY IF: You cannot keep down liquids or your medications. You become short of breath Your fell like you are going to pass out or loose consciousness. Your symptoms persist after you have completed your treatment plan MAKE SURE YOU  Understand these instructions. Will watch your condition. Will get help right away if you are not doing well or get worse.  Your e-visit answers were reviewed by a board certified advanced clinical practitioner to complete your personal care plan.  Depending on the condition, your plan could have included both over the counter or prescription medications.  If there is a problem please reply  once you have received a response from your provider.  Your safety is important to [place].  If you have drug allergies check your prescription carefully.    You can use MyChart to ask questions about today's visit, request a non-urgent call back, or ask for a work or school excuse for 24 hours related to this e-Visit. If it has been greater than 24 hours you will need to follow up with your provider, or enter a new e-Visit to address those concerns.  You will get an e-mail in the next  two days asking about your experience.  I hope that your e-visit has been valuable and will speed your recovery. Thank you for using e-visits.  I have spent 5 minutes in review of e-visit questionnaire, review and updating patient chart, medical decision making and response to patient.   Margaretann Loveless, PA-C

## 2023-04-23 ENCOUNTER — Ambulatory Visit: Payer: BC Managed Care – PPO | Admitting: Women's Health

## 2023-04-24 ENCOUNTER — Encounter: Payer: Self-pay | Admitting: Women's Health

## 2023-04-24 ENCOUNTER — Ambulatory Visit: Payer: BC Managed Care – PPO | Admitting: Women's Health

## 2023-04-24 VITALS — BP 116/68 | HR 78 | Ht 66.0 in | Wt 138.0 lb

## 2023-04-24 DIAGNOSIS — Z3041 Encounter for surveillance of contraceptive pills: Secondary | ICD-10-CM

## 2023-04-24 DIAGNOSIS — R634 Abnormal weight loss: Secondary | ICD-10-CM

## 2023-04-24 NOTE — Progress Notes (Signed)
GYN VISIT Patient name: Maria Cooper MRN [id number]  Date of birth: [birth year] Chief Complaint:   Follow-up (On birth control)  History of Present Illness:   Maria Cooper is a 28 y.o. [phone] Caucasian female being seen today for f/u on LoLo rx'd 11/12 for heavy periods.  Will bleed for a week, stop for a week and bleed again. This pack she skipped placebo pills and hasn't bled yet, so may start doing that. Doing well on wellbutrin, has lost some weight, was 151lb on 6/4 when rx'd, now 138lb. Eating well.  Patient's last menstrual period was 04/01/2023. The current method of family planning is OCP (estrogen/progesterone).  Last pap 08/15/22. Results were: NILM w/ HRHPV negative     04/24/2023   11:08 AM 02/20/2023    1:54 PM 01/23/2023    4:53 PM 08/15/2022    3:34 PM 11/28/2019   10:27 AM  Depression screen PHQ 2/9  Decreased Interest 0 0 1 1 0  Down, Depressed, Hopeless 0 0 1 1 0  PHQ - 2 Score 0 0 2 2 0  Altered sleeping 0 0 1 1 0  Tired, decreased energy 1 0 1 2 0  Change in appetite 0 0 0 0 0  Feeling bad or failure about yourself  0 0 1 0 0  Trouble concentrating 0 1 1 1  0  Moving slowly or fidgety/restless 0 0 1 0 0  Suicidal thoughts 0 0 1 0 0  PHQ-9 Score 1 1 8 6  0  Difficult doing work/chores Not difficult at all  Somewhat difficult          04/24/2023   11:09 AM 02/20/2023    1:55 PM 01/23/2023    4:55 PM 08/15/2022    3:35 PM  GAD 7 : Generalized Anxiety Score  Nervous, Anxious, on Edge 0 0 0 0  Control/stop worrying 0 0 0 0  Worry too much - different things 0 0 1 1  Trouble relaxing 0 0 1 1  Restless 0 0 0 0  Easily annoyed or irritable 0 1 1 2   Afraid - awful might happen 0 0 1 1  Total GAD 7 Score 0 1 4 5   Anxiety Difficulty   Somewhat difficult      Review of Systems:   Pertinent items are noted in HPI Denies fever/chills, dizziness, headaches, visual disturbances, fatigue, shortness of breath, chest pain, abdominal pain, vomiting, abnormal  vaginal discharge/itching/odor/irritation, problems with periods, bowel movements, urination, or intercourse unless otherwise stated above.  Pertinent History Reviewed:  Reviewed past medical,surgical, social, obstetrical and family history.  Reviewed problem list, medications and allergies. Physical Assessment:   Vitals:   04/24/23 1102  BP: 116/68  Pulse: 78  Weight: 138 lb (62.6 kg)  Height: 5\' 6"  (1.676 m)  Body mass index is 22.27 kg/m.       Physical Examination:   General appearance: alert, well appearing, and in no distress  Mental status: alert, oriented to person, place, and time  Skin: warm & dry   Cardiovascular: normal heart rate noted  Respiratory: normal respiratory effort, no distress  Abdomen: soft, non-tender   Pelvic: examination not indicated  Extremities: no edema   Chaperone: N/A    No results found for this or any previous visit (from the past 24 hours).  Assessment & Plan:  1) Contraception surveillance> can skip placebo pills to see if will help regulate periods better, if decides for another pill just let me know  2) Weight loss> 13lb since starting wellbutrin, eats well. If continues, let me know  Meds: No orders of the defined types were placed in this encounter.   No orders of the defined types were placed in this encounter.   Return for after 6/4 for physical.  Cheral Marker CNM, [facility] 04/24/2023 11:52 AM

## 2023-05-09 ENCOUNTER — Telehealth: Payer: BC Managed Care – PPO | Admitting: Physician Assistant

## 2023-05-09 DIAGNOSIS — R3989 Other symptoms and signs involving the genitourinary system: Secondary | ICD-10-CM | POA: Diagnosis not present

## 2023-05-09 MED ORDER — NITROFURANTOIN MONOHYD MACRO 100 MG PO CAPS: 100.0000 mg | ORAL_CAPSULE | Freq: Two times a day (BID) | ORAL | 0 refills | Status: DC

## 2023-05-09 NOTE — Progress Notes (Signed)

## 2023-06-10 ENCOUNTER — Telehealth: Admitting: Physician Assistant

## 2023-06-10 ENCOUNTER — Encounter: Payer: Self-pay | Admitting: Physician Assistant

## 2023-06-10 DIAGNOSIS — R112 Nausea with vomiting, unspecified: Secondary | ICD-10-CM | POA: Diagnosis not present

## 2023-06-10 MED ORDER — ONDANSETRON 4 MG PO TBDP: 4.0000 mg | ORAL_TABLET | Freq: Three times a day (TID) | ORAL | 0 refills | Status: DC | PRN

## 2023-06-10 NOTE — Progress Notes (Signed)

## 2023-07-17 ENCOUNTER — Other Ambulatory Visit: Payer: Self-pay | Admitting: *Deleted

## 2023-07-17 ENCOUNTER — Encounter: Payer: Self-pay | Admitting: *Deleted

## 2023-07-17 DIAGNOSIS — R748 Abnormal levels of other serum enzymes: Secondary | ICD-10-CM

## 2023-08-17 ENCOUNTER — Other Ambulatory Visit (HOSPITAL_COMMUNITY): Payer: Self-pay | Admitting: Family Medicine

## 2023-08-17 ENCOUNTER — Encounter (HOSPITAL_COMMUNITY): Payer: Self-pay | Admitting: Family Medicine

## 2023-08-17 DIAGNOSIS — R42 Dizziness and giddiness: Secondary | ICD-10-CM | POA: Diagnosis not present

## 2023-08-17 DIAGNOSIS — M6208 Separation of muscle (nontraumatic), other site: Secondary | ICD-10-CM | POA: Diagnosis not present

## 2023-08-17 DIAGNOSIS — Z0001 Encounter for general adult medical examination with abnormal findings: Secondary | ICD-10-CM | POA: Diagnosis not present

## 2023-08-17 DIAGNOSIS — S3500XA Unspecified injury of abdominal aorta, initial encounter: Secondary | ICD-10-CM

## 2023-08-17 DIAGNOSIS — Z1331 Encounter for screening for depression: Secondary | ICD-10-CM | POA: Diagnosis not present

## 2023-08-17 DIAGNOSIS — Z6822 Body mass index (BMI) 22.0-22.9, adult: Secondary | ICD-10-CM | POA: Diagnosis not present

## 2023-08-24 ENCOUNTER — Ambulatory Visit
Admission: RE | Admit: 2023-08-24 | Discharge: 2023-08-24 | Disposition: A | Discharge status: Home / Self Care | Source: Ambulatory Visit | Attending: Family Medicine | Admitting: Family Medicine

## 2023-08-24 DIAGNOSIS — S3500XA Unspecified injury of abdominal aorta, initial encounter: Secondary | ICD-10-CM | POA: Insufficient documentation

## 2023-08-24 DIAGNOSIS — Z8249 Family history of ischemic heart disease and other diseases of the circulatory system: Secondary | ICD-10-CM | POA: Diagnosis not present

## 2023-09-03 ENCOUNTER — Encounter: Payer: Self-pay | Admitting: Women's Health

## 2023-09-03 ENCOUNTER — Other Ambulatory Visit: Payer: Self-pay | Admitting: Women's Health

## 2023-09-03 MED ORDER — BUPROPION HCL ER (XL) 300 MG PO TB24: 300.0000 mg | ORAL_TABLET | Freq: Every day | ORAL | 11 refills | Status: AC

## 2023-09-05 ENCOUNTER — Ambulatory Visit: Admitting: Women's Health

## 2023-11-15 ENCOUNTER — Other Ambulatory Visit: Payer: Self-pay | Admitting: Women's Health

## 2023-11-15 DIAGNOSIS — N3001 Acute cystitis with hematuria: Secondary | ICD-10-CM | POA: Diagnosis not present

## 2023-11-15 DIAGNOSIS — R300 Dysuria: Secondary | ICD-10-CM | POA: Diagnosis not present

## 2023-12-10 ENCOUNTER — Other Ambulatory Visit: Payer: Self-pay | Admitting: Medical Genetics

## 2023-12-14 ENCOUNTER — Other Ambulatory Visit (HOSPITAL_COMMUNITY)
Admission: RE | Admit: 2023-12-14 | Discharge: 2023-12-14 | Disposition: A | Discharge status: Home / Self Care | Payer: Self-pay | Source: Ambulatory Visit | Attending: Medical Genetics | Admitting: Medical Genetics

## 2023-12-21 LAB — GENECONNECT MOLECULAR SCREEN: Genetic Analysis Overall Interpretation: NEGATIVE

## 2024-02-06 ENCOUNTER — Telehealth: Admitting: Physician Assistant

## 2024-02-06 DIAGNOSIS — J019 Acute sinusitis, unspecified: Secondary | ICD-10-CM

## 2024-02-06 DIAGNOSIS — B9689 Other specified bacterial agents as the cause of diseases classified elsewhere: Secondary | ICD-10-CM

## 2024-02-06 MED ORDER — AMOXICILLIN-POT CLAVULANATE 875-125 MG PO TABS: 1.0000 | ORAL_TABLET | Freq: Two times a day (BID) | ORAL | 0 refills | Status: DC

## 2024-02-06 NOTE — Progress Notes (Signed)

## 2024-02-29 DIAGNOSIS — L578 Other skin changes due to chronic exposure to nonionizing radiation: Secondary | ICD-10-CM | POA: Diagnosis not present

## 2024-02-29 DIAGNOSIS — I998 Other disorder of circulatory system: Secondary | ICD-10-CM | POA: Diagnosis not present

## 2024-02-29 DIAGNOSIS — D1801 Hemangioma of skin and subcutaneous tissue: Secondary | ICD-10-CM | POA: Diagnosis not present

## 2024-04-04 ENCOUNTER — Telehealth: Admitting: Physician Assistant

## 2024-04-04 DIAGNOSIS — A084 Viral intestinal infection, unspecified: Secondary | ICD-10-CM

## 2024-04-04 MED ORDER — ONDANSETRON 4 MG PO TBDP: 4.0000 mg | ORAL_TABLET | Freq: Three times a day (TID) | ORAL | 0 refills | Status: AC | PRN

## 2024-04-04 NOTE — Progress Notes (Signed)
